# Patient Record
Sex: Female | Born: 1990 | Race: White | Hispanic: No | Marital: Married | Smoking: Never smoker
Health system: Southern US, Community
[De-identification: ages and names within clinical notes are randomized; demographics above are authoritative.]

## PROBLEM LIST (undated history)

## (undated) DIAGNOSIS — I10 Essential (primary) hypertension: Secondary | ICD-10-CM

## (undated) DIAGNOSIS — Z803 Family history of malignant neoplasm of breast: Secondary | ICD-10-CM

## (undated) DIAGNOSIS — K219 Gastro-esophageal reflux disease without esophagitis: Secondary | ICD-10-CM

## (undated) HISTORY — DX: Family history of malignant neoplasm of breast: Z80.3

## (undated) HISTORY — DX: Essential (primary) hypertension: I10

## (undated) HISTORY — PX: DIAGNOSTIC LAPAROSCOPY: SUR761

## (undated) HISTORY — PX: TONSILLECTOMY: SUR1361

---

## 2018-01-13 ENCOUNTER — Encounter: Payer: Self-pay | Admitting: Obstetrics and Gynecology

## 2018-01-13 ENCOUNTER — Ambulatory Visit (INDEPENDENT_AMBULATORY_CARE_PROVIDER_SITE_OTHER): Payer: BC Managed Care – PPO | Admitting: Obstetrics and Gynecology

## 2018-01-13 VITALS — BP 128/82 | HR 79 | Ht 69.0 in | Wt 258.0 lb

## 2018-01-13 DIAGNOSIS — Z31438 Encounter for other genetic testing of female for procreative management: Secondary | ICD-10-CM

## 2018-01-13 DIAGNOSIS — I10 Essential (primary) hypertension: Secondary | ICD-10-CM | POA: Diagnosis not present

## 2018-01-13 DIAGNOSIS — Z3169 Encounter for other general counseling and advice on procreation: Secondary | ICD-10-CM

## 2018-01-13 NOTE — Progress Notes (Signed)
Obstetrics & Gynecology Office Visit   Chief Complaint: Preconception Consult  History of Present Illness: Patient is a 27 y.o. G0P0000 interested in pursuing pregnancy in the near future.  She reports regular menstrual periods, and is currently using OCP (estrogen/progesterone) for contraception.  Prior pregnancies history of not applicable.  Her past medical history is notable for chronic hypertension.  The patient is current on her vaccinations, had MMR recently for work when titers did not document immunity.  She has had chickenpox.  Family history for the patient and her partner's family were reviewed.  There is no family history of genetic diseases , specifically Dyann Kief disease, spinal muscular atrophy, muscular dystrophy, skeletal dysplasias, cystic fibrosis and sickle cell disease.  There is no family history of birth defects , specifically spina bifida, cardiac defects, omphalocele or gastroschisis and cleft lip or cleft palate.  There is no [place], [place], middle [place] and [place] ancestry.  There is no family history of mental retardation, fragile X and or premature ovarian failure.  Patient has family history of autism  Review of Systems: Review of Systems  Constitutional: Negative for chills and fever.  HENT: Negative for congestion.   Respiratory: Negative for cough and shortness of breath.   Cardiovascular: Negative for chest pain and palpitations.  Gastrointestinal: Negative for abdominal pain, constipation, diarrhea, heartburn, nausea and vomiting.  Genitourinary: Negative for dysuria, frequency and urgency.  Skin: Negative for itching and rash.  Neurological: Negative for dizziness and headaches.  Endo/Heme/Allergies: Negative for polydipsia.  Psychiatric/Behavioral: Negative for depression.    Past Medical History:  Past Medical History:  Diagnosis Date  . Hypertension     Past Surgical History:  History reviewed. No pertinent surgical  history.  Gynecologic History: Patient's last menstrual period was 12/27/2017.  Obstetric History: G0P0000  Family History:  History reviewed. No pertinent family history.  Social History:  Social History   Socioeconomic History  . Marital status: Married    Spouse name: Not on file  . Number of children: Not on file  . Years of education: Not on file  . Highest education level: Not on file  Occupational History  . Not on file  Social Needs  . Financial resource strain: Not on file  . Food insecurity:    Worry: Not on file    Inability: Not on file  . Transportation needs:    Medical: Not on file    Non-medical: Not on file  Tobacco Use  . Smoking status: Never Smoker  . Smokeless tobacco: Never Used  Substance and Sexual Activity  . Alcohol use: Never    Frequency: Never  . Drug use: Never  . Sexual activity: Yes    Partners: Male    Birth control/protection: Pill  Lifestyle  . Physical activity:    Days per week: Not on file    Minutes per session: Not on file  . Stress: Not on file  Relationships  . Social connections:    Talks on phone: Not on file    Gets together: Not on file    Attends religious service: Not on file    Active member of club or organization: Not on file    Attends meetings of clubs or organizations: Not on file    Relationship status: Not on file  . Intimate partner violence:    Fear of current or ex partner: Not on file    Emotionally abused: Not on file    Physically abused: Not on  file    Forced sexual activity: Not on file  Other Topics Concern  . Not on file  Social History Narrative  . Not on file    Allergies:  Allergies  Allergen Reactions  . Penicillins Itching  . Sulfa Antibiotics     Medications: Prior to Admission medications   Medication Sig Start Date End Date Taking? Authorizing Provider  hydrochlorothiazide (HYDRODIURIL) 25 MG tablet Take by mouth.   Yes [provider]  Norgestimate-Ethinyl  Estradiol Triphasic (ORTHO TRI-CYCLEN LO) 0.18/0.215/0.25 MG-25 MCG tab Take by mouth. 10/23/17  Yes [provider]    Physical Exam Vitals:  Blood pressure 128/82, pulse 79, height 5' 9"  (1.753 m), weight 258 lb (117 kg), last menstrual period 12/27/2017. Body mass index is 38.1 kg/m.  General: NAD HEENT: normocephalic, anicteric Pulmonary: No increased work of breathing Neurologic: Grossly intact Psychiatric: mood appropriate, affect full  Female chaperone present for pelvic and breast  portions of the physical exam  Assessment: 27 y.o. G0P0000 presenting for preconception counseling  Plan: Problem List Items Addressed This Visit    None    Visit Diagnoses    Encounter for other genetic testing of female for procreative management    -  Primary   Relevant Orders   Inheritest Core(CF97,SMA,FraX)   Encounter for preconception consultation       Relevant Orders   Inheritest Core(CF97,SMA,FraX)   Benign essential HTN       Relevant Medications   hydrochlorothiazide (HYDRODIURIL) 25 MG tablet      1) The patient was instructed to start prenatal vitamins at least one month prior to actively trying to conceive.  The role and rational of prenatal vitamins in preventing neural tube defects were discussed. - taking prenatal gummies with DHA  2) Immunizations are up to date  3) Family history reviewed.  Preconception genetic testing and or counseling was offered at today's visit based on review of personal and family history.  4) HTN - well controlled on hydrochlorothiazide currently. There are no associated birth defects with hydrochlorothiazide use but theoretical risk of fetal biochemical abnormalities and decreased uteroplacental transfusion.  These risk appear to be mitigated in patient who are on HCTZ prior to conception.  However, the preferred blood pressure agents in pregnancy are labetalol or methyldopa.  - At present patient opts to discontinue HCTZ and monitor  BP's  - Discussed goal BP in pregnancy systolic <010 diastolic <932 - Discussed baseline labs on conception -  Start low dose ASA at >[redacted] weeks gestation as per USPTF recommendation "Low-Dose Aspirin Use for the Prevention of Morbidity and Mortality From Preeclampsia: Preventive Medicine"  furthermore endorsed by ACOG, WHO, and NIH based on evidence level B for the prevention of preeclampsia  In women deemed high risk  (diabetes, renal disease, chronic hypertension, history of preeclampsia in prior gestation, autoimmune diseases, or multifetal gestations)  5) A total of 30 minutes were spent in face-to-face contact with the patient during this encounter with over half of that time devoted to counseling and coordination of care.  5) Return in about 6 months (around 07/16/2018) for annual.   [name], MD, [place], [facility] 01/13/2018, 10:15 AM

## 2018-01-13 NOTE — Patient Instructions (Signed)
[email or web address] 

## 2018-01-25 LAB — INHERITEST CORE(CF97,SMA,FRAX)

## 2018-01-26 ENCOUNTER — Encounter (INDEPENDENT_AMBULATORY_CARE_PROVIDER_SITE_OTHER): Payer: Self-pay

## 2018-03-10 ENCOUNTER — Other Ambulatory Visit (HOSPITAL_COMMUNITY)
Admission: RE | Admit: 2018-03-10 | Discharge: 2018-03-10 | Disposition: A | Discharge status: Home / Self Care | Payer: BC Managed Care – PPO | Source: Ambulatory Visit | Attending: Obstetrics and Gynecology | Admitting: Obstetrics and Gynecology

## 2018-03-10 ENCOUNTER — Encounter: Payer: Self-pay | Admitting: Obstetrics and Gynecology

## 2018-03-10 ENCOUNTER — Ambulatory Visit (INDEPENDENT_AMBULATORY_CARE_PROVIDER_SITE_OTHER): Payer: BC Managed Care – PPO | Admitting: Obstetrics and Gynecology

## 2018-03-10 VITALS — BP 124/86 | HR 79 | Wt 263.0 lb

## 2018-03-10 DIAGNOSIS — Z124 Encounter for screening for malignant neoplasm of cervix: Secondary | ICD-10-CM | POA: Diagnosis not present

## 2018-03-10 DIAGNOSIS — O0991 Supervision of high risk pregnancy, unspecified, first trimester: Secondary | ICD-10-CM | POA: Insufficient documentation

## 2018-03-10 DIAGNOSIS — O99210 Obesity complicating pregnancy, unspecified trimester: Secondary | ICD-10-CM | POA: Insufficient documentation

## 2018-03-10 DIAGNOSIS — N912 Amenorrhea, unspecified: Secondary | ICD-10-CM

## 2018-03-10 DIAGNOSIS — O10911 Unspecified pre-existing hypertension complicating pregnancy, first trimester: Secondary | ICD-10-CM

## 2018-03-10 DIAGNOSIS — Z3201 Encounter for pregnancy test, result positive: Secondary | ICD-10-CM

## 2018-03-10 DIAGNOSIS — O10919 Unspecified pre-existing hypertension complicating pregnancy, unspecified trimester: Secondary | ICD-10-CM | POA: Insufficient documentation

## 2018-03-10 DIAGNOSIS — O99211 Obesity complicating pregnancy, first trimester: Secondary | ICD-10-CM

## 2018-03-10 DIAGNOSIS — Z3A01 Less than 8 weeks gestation of pregnancy: Secondary | ICD-10-CM | POA: Diagnosis not present

## 2018-03-10 DIAGNOSIS — O0990 Supervision of high risk pregnancy, unspecified, unspecified trimester: Secondary | ICD-10-CM | POA: Insufficient documentation

## 2018-03-10 LAB — POCT URINALYSIS DIPSTICK OB
Glucose, UA: NEGATIVE
POC,PROTEIN,UA: NEGATIVE

## 2018-03-10 LAB — POCT URINE PREGNANCY: Preg Test, Ur: POSITIVE — AB

## 2018-03-10 LAB — OB RESULTS CONSOLE VARICELLA ZOSTER ANTIBODY, IGG: Varicella: IMMUNE

## 2018-03-10 NOTE — Progress Notes (Signed)
New Obstetric Patient H&P    Chief Complaint: "Desires prenatal care"   History of Present Illness: Patient is a 27 y.o. G1P0000 Not Hispanic or Latino female, presents with amenorrhea and positive home pregnancy test. Patient's last menstrual period was 01/27/2018 (exact date). and based on her  LMP, her EDD is Estimated Date of Delivery: 11/03/18 and her EGA is [redacted]w[redacted]d. Cycles are regular monthly.    She had a urine pregnancy test which was positive 1 week(s)  ago. Her last menstrual period was normal. Since her LMP she claims she has experienced fatigue, breast tenderness, mild cramping. She denies vaginal bleeding. Her past medical history is notable for [facility], previously on HCTZ but discontinued on attempting to conceive.   Since her LMP, she admits to the use of tobacco products  no She claims she has gained   no pounds since the start of her pregnancy.  There are cats in the home in the home  no  She admits close contact with children on a regular basis  no  She has had chicken pox in the past yes She has had Tuberculosis exposures, symptoms, or previously tested positive for TB   no Current or past history of domestic violence. no  Genetic Screening/Teratology Counseling: (Includes patient, baby's father, or anyone in either family with:)   60. Patient's age >/= 34 at [facility]  no 2. Thalassemia ([place], [place], [place], or Asian background): MCV<80  no 3. Neural tube defect (meningomyelocele, spina bifida, anencephaly)  no 4. Congenital heart defect  not applicable  5. Down syndrome  no 6. Tay-Sachs (Jewish, [place])  no 7. Canavan's Disease  no 8. Sickle cell disease or trait (African)  no  9. Hemophilia or other blood disorders  no  10. Muscular dystrophy  no  11. Cystic fibrosis  no  12. Huntington's Chorea  no  13. Mental retardation/autism  no 14. Other inherited genetic or chromosomal disorder  no 15. Maternal metabolic disorder (DM, PKU, etc)  no 16. Patient  or FOB with a child with a birth defect not listed above no  16a. Patient or FOB with a birth defect themselves no 17. Recurrent pregnancy loss, or stillbirth  no  18. Any medications since LMP other than prenatal vitamins (include vitamins, supplements, OTC meds, drugs, alcohol)  no 19. Any other genetic/environmental exposure to discuss  no  Infection History:   1. Lives with someone with TB or TB exposed  no  2. Patient or partner has history of genital herpes  no 3. Rash or viral illness since LMP  no 4. History of STI (GC, CT, HPV, syphilis, HIV)  no 5. History of recent travel :  no  Other pertinent information:  no     Review of Systems:10 point review of systems negative unless otherwise noted in HPI  Past Medical History:  Past Medical History:  Diagnosis Date  . Hypertension     Past Surgical History:  History reviewed. No pertinent surgical history.  Gynecologic History: Patient's last menstrual period was 01/27/2018 (exact date).  Obstetric History: G1P0000  Family History:  No family history on file.  Social History:  Social History   Socioeconomic History  . Marital status: Married    Spouse name: Not on file  . Number of children: Not on file  . Years of education: Not on file  . Highest education level: Not on file  Occupational History  . Not on file  Social Needs  . Emergency planning/management officer  strain: Not on file  . Food insecurity:    Worry: Not on file    Inability: Not on file  . Transportation needs:    Medical: Not on file    Non-medical: Not on file  Tobacco Use  . Smoking status: Never Smoker  . Smokeless tobacco: Never Used  Substance and Sexual Activity  . Alcohol use: Never    Frequency: Never  . Drug use: Never  . Sexual activity: Yes    Partners: Male    Birth control/protection: Pill  Lifestyle  . Physical activity:    Days per week: Not on file    Minutes per session: Not on file  . Stress: Not on file  Relationships  .  Social connections:    Talks on phone: Not on file    Gets together: Not on file    Attends religious service: Not on file    Active member of club or organization: Not on file    Attends meetings of clubs or organizations: Not on file    Relationship status: Not on file  . Intimate partner violence:    Fear of current or ex partner: Not on file    Emotionally abused: Not on file    Physically abused: Not on file    Forced sexual activity: Not on file  Other Topics Concern  . Not on file  Social History Narrative  . Not on file    Allergies:  Allergies  Allergen Reactions  . Penicillins Itching  . Sulfa Antibiotics     Medications: Prior to Admission medications   Medication Sig Start Date End Date Taking? Authorizing Provider  hydrochlorothiazide (HYDRODIURIL) 25 MG tablet Take by mouth.    [provider]    Physical Exam Vitals: Blood pressure 124/86, pulse 79, weight 263 lb (119.3 kg), last menstrual period 01/27/2018. Body mass index is 38.84 kg/m.  General: NAD HEENT: normocephalic, anicteric Pulmonary: No increased work of breathing, CTAB Abdomen: Ssoft, non-tender, non-distended.  Umbilicus without lesions.  No hepatomegaly, splenomegaly or masses palpable. No evidence of hernia  Genitourinary:  External: Normal external female genitalia.  Normal urethral meatus, normal  Bartholin's and Skene's glands.    Vagina: Normal vaginal mucosa, no evidence of prolapse.    Cervix: Grossly normal in appearance, no bleeding  Uterus:  Non-enlarged, mobile, normal contour.  No CMT  Adnexa: ovaries non-enlarged, no adnexal masses  Rectal: deferred Extremities: no edema, erythema, or tenderness Neurologic: Grossly intact Psychiatric: mood appropriate, affect full   Assessment: 27 y.o. G1P0000 at [redacted]w[redacted]d presenting to initiate prenatal care  Pregnancy #1 Problems (from 01/27/18 to present)    Problem Noted Resolved   Supervision of high risk pregnancy, antepartum  03/10/2018 by [name], MD No   Overview Addendum 03/11/2018  9:55 AM by [name], MD    Clinic [facility] Prenatal Labs  Dating  Blood type: O/Positive/-- (09/18 1153)   Genetic Screen 1 Screen:    AFP:     NIPS: Inheritest: SMA, CF, Fragile-X negative Antibody:Negative (09/18 1153)  Anatomic [place]  Rubella: 5.81 (09/18 1153) Varicella: Immune  GTT Early:               Third trimester:  RPR: Non Reactive (09/18 1153)   Rhogam  HBsAg: Negative (09/18 1153)   TDaP vaccine                       Flu Shot: HIV: Non Reactive (09/18 1153)   Baby  Food                                GBS:   Contraception  Pap:  CBB     CS/VBAC    Support Person Husband [name]           Chronic hypertension during pregnancy, antepartum 03/10/2018 by [name], MD No   Overview Signed 03/11/2018  9:53 AM by [name], MD    [ ]  Aspirin 81 mg daily after 12 weeks; discontinue after 36 weeks [ ]  baseline labs with CBC, CMP, urine protein/creatinine ratio [ ]  no BP meds unless BPs become elevated [ ]  ultrasound for growth at 28, 32, 36 weeks [ ]  Baseline EKG   Current antihypertensives:  None   Baseline and surveillance labs (pulled in from [facility], refresh links as needed)  Lab Results  Component Value Date   PLT 379 03/10/2018   CREATININE 0.70 03/10/2018   AST 18 03/10/2018   ALT 10 03/10/2018    Antenatal Testing CHTN - O10.919  Group I  BP < 140/90, no preeclampsia, AGA,  nml AFV, +/- meds    Group II BP > 140/90, on meds, no preeclampsia, AGA, nml AFV  20-28-34-38  20-24-28-32-35-38  32//2 x wk  28//BPP wkly then 32//2 x wk  40 no meds; 39 meds  PRN or 37  Pre-eclampsia  GHTN - O13.9/Preeclampsia without severe features  - O14.00   Preeclampsia with severe features - O14.10  Q 3-4wks  Q 2 wks  28//BPP wkly then 32//2 x wk  Inpatient  37  PRN or 34        Maternal obesity, antepartum 03/10/2018 by [name], MD No       Plan: 1) Avoid  alcoholic beverages. 2) Patient encouraged not to smoke.  3) Discontinue the use of all non-medicinal drugs and chemicals.  4) Take prenatal vitamins daily.  5) Nutrition, food safety (fish, cheese advisories, and high nitrite foods) and exercise discussed. 6) Hospital and practice style discussed with cross coverage system.  7) Genetic Screening, such as with 1st Trimester Screening, cell free fetal DNA, AFP testing, and Ultrasound, as well as with amniocentesis and CVS as appropriate, is discussed with patient. At the conclusion of today's visit patient undecided genetic testing 8) CHTN - discussed ASA after 12 weeks, no meds unless BP 160/110, monthly growth scan starting at 28 weeks.  Baseline labs obtained today 9) Dating scan ordered  [name], MD, [place], [place]

## 2018-03-10 NOTE — Progress Notes (Signed)
NOB cramping

## 2018-03-11 LAB — RPR+RH+ABO+RUB AB+AB SCR+CB...
Antibody Screen: NEGATIVE
HEP B S AG: NEGATIVE
HIV Screen 4th Generation wRfx: NONREACTIVE
Hematocrit: 38.6 % (ref 34.0–46.6)
Hemoglobin: 13.2 g/dL (ref 11.1–15.9)
MCH: 28.4 pg (ref 26.6–33.0)
MCHC: 34.2 g/dL (ref 31.5–35.7)
MCV: 83 fL (ref 79–97)
Platelets: 379 10*3/uL (ref 150–450)
RBC: 4.65 x10E6/uL (ref 3.77–5.28)
RDW: 12.5 % (ref 12.3–15.4)
RH TYPE: POSITIVE
RPR Ser Ql: NONREACTIVE
Rubella Antibodies, IGG: 5.81 index (ref >0.99)
VARICELLA: 1480 {index} (ref >165)
WBC: 11.8 10*3/uL — ABNORMAL HIGH (ref 3.4–10.8)

## 2018-03-11 LAB — COMPREHENSIVE METABOLIC PANEL
ALBUMIN: 4.2 g/dL (ref 3.5–5.5)
ALK PHOS: 104 IU/L (ref 39–117)
ALT: 10 IU/L (ref 0–32)
AST: 18 IU/L (ref 0–40)
Albumin/Globulin Ratio: 1.8 (ref 1.2–2.2)
BUN / CREAT RATIO: 10 (ref 9–23)
BUN: 7 mg/dL (ref 6–20)
Bilirubin Total: 0.2 mg/dL (ref 0.0–1.2)
CO2: 20 mmol/L (ref 20–29)
CREATININE: 0.7 mg/dL (ref 0.57–1.00)
Calcium: 9.1 mg/dL (ref 8.7–10.2)
Chloride: 99 mmol/L (ref 96–106)
GFR calc Af Amer: 136 mL/min/{1.73_m2} (ref >59)
GFR calc non Af Amer: 118 mL/min/{1.73_m2} (ref >59)
GLUCOSE: 63 mg/dL — AB (ref 65–99)
Globulin, Total: 2.4 g/dL (ref 1.5–4.5)
Potassium: 4.4 mmol/L (ref 3.5–5.2)
Sodium: 137 mmol/L (ref 134–144)
Total Protein: 6.6 g/dL (ref 6.0–8.5)

## 2018-03-11 LAB — PROTEIN / CREATININE RATIO, URINE
CREATININE, UR: 33.7 mg/dL
PROTEIN/CREAT RATIO: 166 mg/g{creat} (ref 0–200)
Protein, Ur: 5.6 mg/dL

## 2018-03-12 LAB — URINE CULTURE: Organism ID, Bacteria: NO GROWTH

## 2018-03-12 LAB — CYTOLOGY - PAP
CHLAMYDIA, DNA PROBE: NEGATIVE
DIAGNOSIS: NEGATIVE
NEISSERIA GONORRHEA: NEGATIVE

## 2018-03-19 ENCOUNTER — Ambulatory Visit (INDEPENDENT_AMBULATORY_CARE_PROVIDER_SITE_OTHER): Payer: BC Managed Care – PPO

## 2018-03-19 ENCOUNTER — Other Ambulatory Visit: Payer: Self-pay | Admitting: Obstetrics and Gynecology

## 2018-03-19 ENCOUNTER — Other Ambulatory Visit: Payer: BC Managed Care – PPO

## 2018-03-19 ENCOUNTER — Ambulatory Visit (INDEPENDENT_AMBULATORY_CARE_PROVIDER_SITE_OTHER): Payer: BC Managed Care – PPO | Admitting: Obstetrics and Gynecology

## 2018-03-19 ENCOUNTER — Encounter: Payer: BC Managed Care – PPO | Admitting: Obstetrics and Gynecology

## 2018-03-19 VITALS — BP 112/64 | Wt 260.0 lb

## 2018-03-19 DIAGNOSIS — Z3A01 Less than 8 weeks gestation of pregnancy: Secondary | ICD-10-CM

## 2018-03-19 DIAGNOSIS — O3481 Maternal care for other abnormalities of pelvic organs, first trimester: Secondary | ICD-10-CM

## 2018-03-19 DIAGNOSIS — N83292 Other ovarian cyst, left side: Secondary | ICD-10-CM | POA: Diagnosis not present

## 2018-03-19 DIAGNOSIS — N83291 Other ovarian cyst, right side: Secondary | ICD-10-CM | POA: Diagnosis not present

## 2018-03-19 DIAGNOSIS — O0990 Supervision of high risk pregnancy, unspecified, unspecified trimester: Secondary | ICD-10-CM

## 2018-03-19 DIAGNOSIS — O99210 Obesity complicating pregnancy, unspecified trimester: Secondary | ICD-10-CM

## 2018-03-19 DIAGNOSIS — O10919 Unspecified pre-existing hypertension complicating pregnancy, unspecified trimester: Secondary | ICD-10-CM

## 2018-03-19 LAB — POCT URINALYSIS DIPSTICK OB
GLUCOSE, UA: NEGATIVE
POC,PROTEIN,UA: NEGATIVE

## 2018-03-19 NOTE — Progress Notes (Signed)
ROB  Nausea Dating scan  Flu vaccine

## 2018-03-21 NOTE — Progress Notes (Signed)
Routine Prenatal Care Visit  Subjective  Claudia Byrd is a 27 y.o. G1P0000 at [redacted]w[redacted]d being seen today for ongoing prenatal care.  She is currently monitored for the following issues for this high-risk pregnancy and has Supervision of high risk pregnancy, antepartum; Chronic hypertension during pregnancy, antepartum; and Maternal obesity, antepartum on their problem list.  ----------------------------------------------------------------------------------- Patient reports no complaints.    .  .   . Denies leaking of fluid.  ----------------------------------------------------------------------------------- The following portions of the patient's history were reviewed and updated as appropriate: allergies, current medications, past family history, past medical history, past social history, past surgical history and problem list. Problem list updated.   Objective  Blood pressure 112/64, weight 260 lb (117.9 kg), last menstrual period 01/27/2018. Pregravid weight 260 lb (117.9 kg) Total Weight Gain 0 lb (0 kg) Urinalysis:      Fetal Status: Fetal Heart Rate (bpm): present         General:  Alert, oriented and cooperative. Patient is in no acute distress.  Skin: Skin is warm and dry. No rash noted.   Cardiovascular: Normal heart rate noted  Respiratory: Normal respiratory effort, no problems with respiration noted  Abdomen: Soft, gravid, appropriate for gestational age.       Pelvic:  Cervical exam deferred        Extremities: Normal range of motion.     ental Status: Normal mood and affect. Normal behavior. Normal judgment and thought content.   US Ob Transvaginal  Result Date: 03/19/2018 Patient Name: Claudia Byrd DOB: 1990-09-03 MRN: [id number] ULTRASOUND REPORT Location: [facility] Date of Service: 03/19/2018 Indications:dating Findings: [name] intrauterine pregnancy is visualized with a CRL consistent with [redacted]w[redacted]d gestation, giving an (U/S) EDD of 10/30/2018. The (U/S) EDD is  consistent with the clinically established EDD of 11/03/2018. FHR: 166 CRL measurement: 15.0 mm Yolk sac is visualized and appears normal and early anatomy is normal. Amnion: visualized and appears normal Right Ovary is not normal in appearance. Large simple cyst measuring 6.1 x 6.2 x 5.2 cm. Left Ovary is not normal appearance. Two simple cyst seen measuring 4.0 x 2.2 x 2.3 cm. Survey of the adnexa demonstrates no adnexal masses. There is trace amount of free peritoneal fluid in the posterior cul de sac. Impression: 1. [redacted]w[redacted]d Viable Singleton Intrauterine pregnancy by U/S. 2. (U/S) EDD is consistent with Clinically established EDD of 11/03/2018 3. Bilateral complex ovarian cysts. Recommendations: 1.Clinical correlation with the patient's History and Physical Exam. Vita Barley, RDMS RVT There is a viable singleton gestation.  The fetal biometry correlates with established dating. Detailed evaluation of the fetal anatomy is precluded by early gestational age.  Corpus luteum cyst left, large simple cyst right.  Re-image at time of anatomy scan or early if symptomatic.  It must be noted that a normal ultrasound particular at this early gestational age is unable to rule out fetal aneuploidy, risk of first trimester miscarriage, or anatomic birth defects. [name], MD, [facility], [facility]     Assessment   27 y.o. G1P0000 at [redacted]w[redacted]d by  11/03/2018, by Last Menstrual Period presenting for routine prenatal visit  Plan   Pregnancy #1 Problems (from 01/27/18 to present)    Problem Noted Resolved   Supervision of high risk pregnancy, antepartum 03/10/2018 by [name], MD No   Overview Addendum 03/11/2018  9:55 AM by [name], MD    Clinic [facility] Prenatal Labs  Dating  Blood type: O/Positive/-- (09/18 1153)  Genetic Screen 1 Screen:    AFP:     NIPS: Inheritest: SMA, CF, Fragile-X negative Antibody:Negative (09/18 1153)  Anatomic [place]  Rubella: 5.81 (09/18  1153) Varicella: Immune  GTT Early:               Third trimester:  RPR: Non Reactive (09/18 1153)   Rhogam  HBsAg: Negative (09/18 1153)   TDaP vaccine                       Flu Shot: HIV: Non Reactive (09/18 1153)   Baby Food                                GBS:   Contraception  Pap:  CBB     CS/VBAC    Support Person Husband Seth           Chronic hypertension during pregnancy, antepartum 03/10/2018 by [name], MD No   Overview Signed 03/11/2018  9:53 AM by [name], MD    [ ]  Aspirin 81 mg daily after 12 weeks; discontinue after 36 weeks [ ]  baseline labs with CBC, CMP, urine protein/creatinine ratio [ ]  no BP meds unless BPs become elevated [ ]  ultrasound for growth at 28, 32, 36 weeks [ ]  Baseline EKG   Current antihypertensives:  None   Baseline and surveillance labs (pulled in from [facility], refresh links as needed)  Lab Results  Component Value Date   PLT 379 03/10/2018   CREATININE 0.70 03/10/2018   AST 18 03/10/2018   ALT 10 03/10/2018    Antenatal Testing CHTN - O10.919  Group I  BP < 140/90, no preeclampsia, AGA,  nml AFV, +/- meds    Group II BP > 140/90, on meds, no preeclampsia, AGA, nml AFV  20-28-34-38  20-24-28-32-35-38  32//2 x wk  28//BPP wkly then 32//2 x wk  40 no meds; 39 meds  PRN or 37  Pre-eclampsia  GHTN - O13.9/Preeclampsia without severe features  - O14.00   Preeclampsia with severe features - O14.10  Q 3-4wks  Q 2 wks  28//BPP wkly then 32//2 x wk  Inpatient  37  PRN or 34        Maternal obesity, antepartum 03/10/2018 by [name], MD No       Gestational age appropriate obstetric precautions including but not limited to vaginal bleeding, contractions, leaking of fluid and fetal movement were reviewed in detail with the patient.    - S=D, suspect corpus luteum and theca lutein cysts   Return in about 4 weeks (around 04/16/2018) for [facility] and early 1-hr/OB billing with with Janett Billow.  [name], MD, [facility], [place]

## 2018-04-16 ENCOUNTER — Ambulatory Visit (INDEPENDENT_AMBULATORY_CARE_PROVIDER_SITE_OTHER): Payer: BC Managed Care – PPO | Admitting: Obstetrics and Gynecology

## 2018-04-16 ENCOUNTER — Other Ambulatory Visit: Payer: BC Managed Care – PPO

## 2018-04-16 VITALS — BP 132/80 | Wt 268.0 lb

## 2018-04-16 DIAGNOSIS — O10919 Unspecified pre-existing hypertension complicating pregnancy, unspecified trimester: Secondary | ICD-10-CM

## 2018-04-16 DIAGNOSIS — O0990 Supervision of high risk pregnancy, unspecified, unspecified trimester: Secondary | ICD-10-CM

## 2018-04-16 DIAGNOSIS — Z1379 Encounter for other screening for genetic and chromosomal anomalies: Secondary | ICD-10-CM

## 2018-04-16 DIAGNOSIS — O99211 Obesity complicating pregnancy, first trimester: Secondary | ICD-10-CM

## 2018-04-16 DIAGNOSIS — Z3A11 11 weeks gestation of pregnancy: Secondary | ICD-10-CM

## 2018-04-16 DIAGNOSIS — O99210 Obesity complicating pregnancy, unspecified trimester: Secondary | ICD-10-CM

## 2018-04-16 DIAGNOSIS — O10911 Unspecified pre-existing hypertension complicating pregnancy, first trimester: Secondary | ICD-10-CM

## 2018-04-16 LAB — POCT URINALYSIS DIPSTICK OB
Glucose, UA: NEGATIVE
PROTEIN: NEGATIVE

## 2018-04-16 NOTE — Progress Notes (Signed)
ROB Early GTT 

## 2018-04-16 NOTE — Progress Notes (Signed)
Routine Prenatal Care Visit  Subjective  Claudia Byrd is a 27 y.o. G1P0000 at 107w2d being seen today for ongoing prenatal care.  She is currently monitored for the following issues for this high-risk pregnancy and has Supervision of high risk pregnancy, antepartum; Chronic hypertension during pregnancy, antepartum; and Maternal obesity, antepartum on their problem list.  ----------------------------------------------------------------------------------- Patient reports no complaints.   Contractions: Not present. Vag. Bleeding: None.  Movement: Absent. Denies leaking of fluid.  ----------------------------------------------------------------------------------- The following portions of the patient's history were reviewed and updated as appropriate: allergies, current medications, past family history, past medical history, past social history, past surgical history and problem list. Problem list updated.   Objective  Blood pressure 132/80, weight 268 lb (121.6 kg), last menstrual period 01/27/2018. Pregravid weight 260 lb (117.9 kg) Total Weight Gain 8 lb (3.629 kg) Urinalysis:      Fetal Status: Fetal Heart Rate (bpm): 157   Movement: Absent     General:  Alert, oriented and cooperative. Patient is in no acute distress.  Skin: Skin is warm and dry. No rash noted.   Cardiovascular: Normal heart rate noted  Respiratory: Normal respiratory effort, no problems with respiration noted  Abdomen: Soft, gravid, appropriate for gestational age. Pain/Pressure: Absent     Pelvic:  Cervical exam deferred        Extremities: Normal range of motion.     ental Status: Normal mood and affect. Normal behavior. Normal judgment and thought content.    There is no immunization history on file for this patient.   Assessment   27 y.o. G1P0000 at [redacted]w[redacted]d by  11/03/2018, by Last Menstrual Period presenting for routine prenatal visit  Plan   Pregnancy #1 Problems (from 01/27/18 to present)    Problem  Noted Resolved   Supervision of high risk pregnancy, antepartum 03/10/2018 by [name], MD No   Overview Addendum 03/11/2018  9:55 AM by [name], MD    [facility] Prenatal Labs  Dating LMP = 7 week [place] Blood type: O/Positive/-- (09/18 1153)   Genetic Screen 1 Screen:    AFP:     NIPS: Inheritest: SMA, CF, Fragile-X negative Antibody:Negative (09/18 1153)  Anatomic [place]  Rubella: 5.81 (09/18 1153) Varicella: Immune  GTT Early:  Obtained 10/25            Third trimester:  RPR: Non Reactive (09/18 1153)   Rhogam N/A HBsAg: Negative (09/18 1153)   TDaP vaccine                       Flu Shot: HIV: Non Reactive (09/18 1153)   Baby Food                                GBS:   Contraception  Pap: 03/10/18 NIL  CBB     CS/VBAC    Support Person Husband Seth           Chronic hypertension during pregnancy, antepartum 03/10/2018 by [name], MD No   Overview Signed 03/11/2018  9:53 AM by [name], MD    [X]  Aspirin 81 mg daily after 12 weeks; discontinue after 36 weeks [X]  baseline labs with CBC, CMP, urine protein/creatinine ratio [ ]  no BP meds unless BPs become elevated [ ]  ultrasound for growth at 28, 32, 36 weeks [ ]  Baseline EKG   Current antihypertensives:  None   Baseline and surveillance labs (pulled  in from [facility], refresh links as needed)  Lab Results  Component Value Date   PLT 379 03/10/2018   CREATININE 0.70 03/10/2018   AST 18 03/10/2018   ALT 10 03/10/2018    Antenatal Testing CHTN - O10.919  Group I  BP < 140/90, no preeclampsia, AGA,  nml AFV, +/- meds    Group II BP > 140/90, on meds, no preeclampsia, AGA, nml AFV  20-28-34-38  20-24-28-32-35-38  32//2 x wk  28//BPP wkly then 32//2 x wk  40 no meds; 39 meds  PRN or 37  Pre-eclampsia  GHTN - O13.9/Preeclampsia without severe features  - O14.00   Preeclampsia with severe features - O14.10  Q 3-4wks  Q 2 wks  28//BPP wkly then 32//2 x wk  Inpatient  37  PRN or 34         Maternal obesity, antepartum 03/10/2018 by [name], MD No       Gestational age appropriate obstetric precautions including but not limited to vaginal bleeding, contractions, leaking of fluid and fetal movement were reviewed in detail with the patient.   - maternity 21 and early 1-hr glucola today - recommend ASA after 12 weeks  Return in about 4 weeks (around 05/14/2018) for ROB.  [name], MD, [facility], [facility] 04/16/2018, 9:27 AM

## 2018-04-17 LAB — GLUCOSE TOLERANCE, 1 HOUR: GLUCOSE, 1HR PP: 111 mg/dL (ref 65–199)

## 2018-04-23 LAB — MATERNIT 21 PLUS CORE, BLOOD
CHROMOSOME 13: NEGATIVE
CHROMOSOME 18: NEGATIVE
CHROMOSOME 21: NEGATIVE
Y Chromosome: NOT DETECTED

## 2018-04-26 ENCOUNTER — Telehealth: Payer: Self-pay

## 2018-04-26 NOTE — Telephone Encounter (Signed)
Pt's appt was changed; wants to know genetic testing results.  [phone]  Pt aware of neg results; female.

## 2018-05-14 ENCOUNTER — Encounter: Payer: BC Managed Care – PPO | Admitting: Obstetrics and Gynecology

## 2018-05-14 ENCOUNTER — Ambulatory Visit (INDEPENDENT_AMBULATORY_CARE_PROVIDER_SITE_OTHER): Payer: BC Managed Care – PPO | Admitting: Advanced Practice Midwife

## 2018-05-14 ENCOUNTER — Encounter: Payer: Self-pay | Admitting: Advanced Practice Midwife

## 2018-05-14 VITALS — BP 120/80 | Wt 271.0 lb

## 2018-05-14 DIAGNOSIS — Z3A15 15 weeks gestation of pregnancy: Secondary | ICD-10-CM

## 2018-05-14 DIAGNOSIS — O0990 Supervision of high risk pregnancy, unspecified, unspecified trimester: Secondary | ICD-10-CM

## 2018-05-14 DIAGNOSIS — O10912 Unspecified pre-existing hypertension complicating pregnancy, second trimester: Secondary | ICD-10-CM

## 2018-05-14 LAB — POCT URINALYSIS DIPSTICK OB
Glucose, UA: NEGATIVE
PROTEIN: NEGATIVE

## 2018-05-14 NOTE — Progress Notes (Signed)
Routine Prenatal Care Visit  Subjective  Claudia Byrd is a 27 y.o. G1P0000 at [redacted]w[redacted]d being seen today for ongoing prenatal care.  She is currently monitored for the following issues for this high-risk pregnancy and has Supervision of high risk pregnancy, antepartum; Chronic hypertension during pregnancy, antepartum; and Maternal obesity, antepartum on their problem list.  ----------------------------------------------------------------------------------- Patient reports having a cold for the past few days. Reviewed comfort measures.    . Vag. Bleeding: None.  Movement: Absent. Denies leaking of fluid.  ----------------------------------------------------------------------------------- The following portions of the patient's history were reviewed and updated as appropriate: allergies, current medications, past family history, past medical history, past social history, past surgical history and problem list. Problem list updated.   Objective  Blood pressure 120/80, weight 271 lb (122.9 kg), last menstrual period 01/27/2018. Pregravid weight 260 lb (117.9 kg) Total Weight Gain 11 lb (4.99 kg) Urinalysis: Urine Protein Negative  Urine Glucose Negative  Fetal Status: Fetal Heart Rate (bpm): 145   Movement: Absent     General:  Alert, oriented and cooperative. Patient is in no acute distress.  Skin: Skin is warm and dry. No rash noted.   Cardiovascular: Normal heart rate noted  Respiratory: Normal respiratory effort, no problems with respiration noted  Abdomen: Soft, gravid, appropriate for gestational age.       Pelvic:  Cervical exam deferred        Extremities: Normal range of motion.     Mental Status: Normal mood and affect. Normal behavior. Normal judgment and thought content.   Assessment   27 y.o. G1P0000 at [redacted]w[redacted]d by  11/03/2018, by Last Menstrual Period presenting for routine prenatal visit  Plan   Pregnancy #1 Problems (from 01/27/18 to present)    Problem Noted Resolved   Supervision of high risk pregnancy, antepartum 03/10/2018 by [name], MD No   Overview Addendum 04/27/2018  2:10 PM by [name], MD    [facility] Prenatal Labs  Dating LMP = 7 week [place] Blood type: O/Positive/-- (09/18 1153)   Genetic Screen NIPS:Normal XX Inheritest: SMA, CF, Fragile-X negative Antibody:Negative (09/18 1153)  Anatomic [place]  Rubella: 5.81 (09/18 1153) Varicella: Immune  GTT Early:111   Third trimester:  RPR: Non Reactive (09/18 1153)   Rhogam N/A HBsAg: Negative (09/18 1153)   TDaP vaccine                       Flu Shot: HIV: Non Reactive (09/18 1153)   Baby Food                                GBS:   Contraception  Pap: 03/10/18 NIL  CBB     CS/VBAC    Support Person Husband Seth           Chronic hypertension during pregnancy, antepartum 03/10/2018 by [name], MD No   Overview Addendum 04/16/2018  9:30 AM by [name], MD    [X]  Aspirin 81 mg daily after 12 weeks; discontinue after 36 weeks [X]  baseline labs with CBC, CMP, urine protein/creatinine ratio [ ]  no BP meds unless BPs become elevated [ ]  ultrasound for growth at 28, 32, 36 weeks  Current antihypertensives:  None   Baseline and surveillance labs (pulled in from [facility], refresh links as needed)  Lab Results  Component Value Date   PLT 379 03/10/2018   CREATININE 0.70 03/10/2018   AST 18 03/10/2018  ALT 10 03/10/2018    Antenatal Testing CHTN - O10.919  Group I  BP < 140/90, no preeclampsia, AGA,  nml AFV, +/- meds    Group II BP > 140/90, on meds, no preeclampsia, AGA, nml AFV  20-28-34-38  20-24-28-32-35-38  32//2 x wk  28//BPP wkly then 32//2 x wk  40 no meds; 39 meds  PRN or 37  Pre-eclampsia  GHTN - O13.9/Preeclampsia without severe features  - O14.00   Preeclampsia with severe features - O14.10  Q 3-4wks  Q 2 wks  28//BPP wkly then 32//2 x wk  Inpatient  37  PRN or 34        Maternal obesity, antepartum 03/10/2018 by [name],  MD No       Preterm labor symptoms and general obstetric precautions including but not limited to vaginal bleeding, contractions, leaking of fluid and fetal movement were reviewed in detail with the patient. Please refer to After Visit Summary for other counseling recommendations.   Return in about 4 weeks (around 06/11/2018) for anatomy scan and rob.  Rod Can, CNM 05/14/2018 8:31 AM

## 2018-05-14 NOTE — Patient Instructions (Signed)

## 2018-05-14 NOTE — Progress Notes (Signed)
ROB- no concerns 

## 2018-06-17 ENCOUNTER — Ambulatory Visit (INDEPENDENT_AMBULATORY_CARE_PROVIDER_SITE_OTHER): Payer: BC Managed Care – PPO | Admitting: Obstetrics and Gynecology

## 2018-06-17 ENCOUNTER — Ambulatory Visit (INDEPENDENT_AMBULATORY_CARE_PROVIDER_SITE_OTHER): Payer: BC Managed Care – PPO

## 2018-06-17 ENCOUNTER — Encounter: Payer: Self-pay | Admitting: Obstetrics and Gynecology

## 2018-06-17 VITALS — BP 120/70 | Wt 277.0 lb

## 2018-06-17 DIAGNOSIS — Z363 Encounter for antenatal screening for malformations: Secondary | ICD-10-CM | POA: Diagnosis not present

## 2018-06-17 DIAGNOSIS — O99210 Obesity complicating pregnancy, unspecified trimester: Secondary | ICD-10-CM

## 2018-06-17 DIAGNOSIS — IMO0002 Reserved for concepts with insufficient information to code with codable children: Secondary | ICD-10-CM

## 2018-06-17 DIAGNOSIS — O99212 Obesity complicating pregnancy, second trimester: Secondary | ICD-10-CM

## 2018-06-17 DIAGNOSIS — O10912 Unspecified pre-existing hypertension complicating pregnancy, second trimester: Secondary | ICD-10-CM

## 2018-06-17 DIAGNOSIS — Z0489 Encounter for examination and observation for other specified reasons: Secondary | ICD-10-CM

## 2018-06-17 DIAGNOSIS — O0990 Supervision of high risk pregnancy, unspecified, unspecified trimester: Secondary | ICD-10-CM

## 2018-06-17 DIAGNOSIS — Z3A20 20 weeks gestation of pregnancy: Secondary | ICD-10-CM

## 2018-06-17 DIAGNOSIS — J019 Acute sinusitis, unspecified: Secondary | ICD-10-CM

## 2018-06-17 DIAGNOSIS — O10919 Unspecified pre-existing hypertension complicating pregnancy, unspecified trimester: Secondary | ICD-10-CM

## 2018-06-17 LAB — POCT URINALYSIS DIPSTICK OB
Glucose, UA: NEGATIVE
POC,PROTEIN,UA: NEGATIVE

## 2018-06-17 MED ORDER — AZITHROMYCIN 250 MG PO TABS: ORAL_TABLET | ORAL | 1 refills | Status: DC

## 2018-06-17 NOTE — Progress Notes (Addendum)
Routine Prenatal Care Visit  Subjective  Claudia Byrd is a 27 y.o. G1P0000 at [redacted]w[redacted]d being seen today for ongoing prenatal care.  She is currently monitored for the following issues for this high-risk pregnancy and has Supervision of high risk pregnancy, antepartum; Chronic hypertension during pregnancy, antepartum; and Maternal obesity, antepartum on their problem list.  ----------------------------------------------------------------------------------- Patient reports no complaints.   Contractions: Not present. Vag. Bleeding: None.  Movement: Absent. Denies leaking of fluid.  ----------------------------------------------------------------------------------- The following portions of the patient's history were reviewed and updated as appropriate: allergies, current medications, past family history, past medical history, past social history, past surgical history and problem list. Problem list updated.   Objective  Blood pressure 120/70, weight 277 lb (125.6 kg), last menstrual period 01/27/2018. Pregravid weight 260 lb (117.9 kg) Total Weight Gain 17 lb (7.711 kg) Urinalysis:      Fetal Status: Fetal Heart Rate (bpm): 145   Movement: Absent     General:  Alert, oriented and cooperative. Patient is in no acute distress.  Skin: Skin is warm and dry. No rash noted.   Cardiovascular: Normal heart rate noted  Respiratory: Normal respiratory effort, no problems with respiration noted  Abdomen: Soft, gravid, appropriate for gestational age. Pain/Pressure: Absent     Pelvic:  Cervical exam deferred        Extremities: Normal range of motion.     ental Status: Normal mood and affect. Normal behavior. Normal judgment and thought content.   Immunization History  Administered Date(s) Administered  . Tdap 10/29/2011, 06/13/2016   US Ob Comp + 14 Wk  Result Date: 06/17/2018 Patient Name: Claudia Byrd DOB: 1991/06/23 MRN: [id number] ULTRASOUND REPORT Location: [facility] Date of Service:  06/17/2018 Indications:Anatomy Ultrasound Findings: Claudia Byrd intrauterine pregnancy is visualized with FHR at 144 BPM. Biometrics give an (U/S) Gestational age of [redacted]w[redacted]d and an (U/S) EDD of 11/01/2018; this correlates with the clinically established Estimated Date of Delivery: 11/03/18 Fetal presentation is Cephalic. EFW: 352g (12oz). Placenta: anterior. Grade: 0 AFI: subjectively normal. Anatomic survey is incomplete for 4 chamber heart and outflow tracts; Gender - female.  Right Ovary has large cyst measuring 7cm. Left Ovary is normal appearance. Survey of the adnexa demonstrates no adnexal masses. There is no free peritoneal fluid in the cul de sac. Impression: 1. [redacted]w[redacted]d Viable Singleton Intrauterine pregnancy by U/S. 2. (U/S) EDD is consistent with Clinically established Estimated Date of Delivery: 11/03/18 . 3. Follow up cardiac views. 4. 7cm right ovarian cyst. Recommendations: 1.Clinical correlation with the patient's History and Physical Exam. Claudia Byrd, RDMS RVT  There is a singleton gestation with subjectively normal amniotic fluid volume. The fetal biometry correlates with established dating. Detailed evaluation of the fetal anatomy was performed.The fetal anatomical survey appears within normal limits within the resolution of ultrasound as described above, with incomplete cardiac views.  Previously imaged bilateral theca lutein cysts, resolution on left persistence on right.  It must be noted that a normal ultrasound is unable to rule out fetal aneuploidy.  [name], MD, [facility], [facility] 06/17/2018, 9:21 AM     Assessment   27 y.o. G1P0000 at [redacted]w[redacted]d by  11/03/2018, by Last Menstrual Period presenting for routine prenatal visit  Plan   Pregnancy #1 Problems (from 01/27/18 to present)    Problem Noted Resolved   Supervision of high risk pregnancy, antepartum 03/10/2018 by [name], MD No   Overview Addendum 04/27/2018  2:10 PM by [name],  MD  [facility] Prenatal Labs  Dating LMP = 7 week [place] Blood type: O/Positive/-- (09/18 1153)   Genetic Screen NIPS:Normal XX Inheritest: SMA, CF, Fragile-X negative Antibody:Negative (09/18 1153)  Anatomic [place]  Rubella: 5.81 (09/18 1153) Varicella: Immune  GTT Early:111   Third trimester:  RPR: Non Reactive (09/18 1153)   Rhogam N/A HBsAg: Negative (09/18 1153)   TDaP vaccine                       Flu Shot: HIV: Non Reactive (09/18 1153)   Baby Food                                GBS:   Contraception  Pap: 03/10/18 NIL  CBB     CS/VBAC    Support Person Husband Seth           Chronic hypertension during pregnancy, antepartum 03/10/2018 by [name], MD No   Overview Addendum 04/16/2018  9:30 AM by [name], MD    [X]  Aspirin 81 mg daily after 12 weeks; discontinue after 36 weeks [X]  baseline labs with CBC, CMP, urine protein/creatinine ratio [ ]  no BP meds unless BPs become elevated [ ]  ultrasound for growth at 28, 32, 36 weeks  Current antihypertensives:  None   Baseline and surveillance labs (pulled in from [facility], refresh links as needed)  Lab Results  Component Value Date   PLT 379 03/10/2018   CREATININE 0.70 03/10/2018   AST 18 03/10/2018   ALT 10 03/10/2018    Antenatal Testing CHTN - O10.919  Group I  BP < 140/90, no preeclampsia, AGA,  nml AFV, +/- meds    Group II BP > 140/90, on meds, no preeclampsia, AGA, nml AFV  20-28-34-38  20-24-28-32-35-38  32//2 x wk  28//BPP wkly then 32//2 x wk  40 no meds; 39 meds  PRN or 37  Pre-eclampsia  GHTN - O13.9/Preeclampsia without severe features  - O14.00   Preeclampsia with severe features - O14.10  Q 3-4wks  Q 2 wks  28//BPP wkly then 32//2 x wk  Inpatient  37  PRN or 34        Maternal obesity, antepartum 03/10/2018 by [name], MD No       Gestational age appropriate obstetric precautions including but not limited to vaginal bleeding, contractions, leaking of fluid and  fetal movement were reviewed in detail with the patient.   - follow up anatomy scan next visit - rx azithromycin sinusitis (PCN allergy)  Return in about 4 weeks (around 07/15/2018) for ROB and follow up anatomy scan.  [name], MD, [facility], [facility] 06/17/2018, 9:40 AM

## 2018-06-17 NOTE — Progress Notes (Signed)
Anatomy scan today. Increased d/c since last visit.

## 2018-06-17 NOTE — Addendum Note (Signed)
Addended by: Dorthula Nettles on: 06/17/2018 10:30 AM   Modules accepted: Orders

## 2018-06-23 NOTE — L&D Delivery Note (Addendum)
Delivery Note - [facility]D Primary OB: [facility] Delivery Physician: Barnett Applebaum, MD Gestational Age: Full term Antepartum complications: none Intrapartum complications: None  Kiwi flat vacuum was applied for maternal exhaustion after 2+ hours of pushing and favorable exam and station.  3 application coinciding w three consequetive contractions.  One pop off.  Delivery successful on the third contraction application.  LOA. No fetal scalp injuries noted.  No shoulder dystocia.  A viable Female was delivered via vertex perentation.  Apgars:8 ,9  Weight:  pending .   Placenta status: spontaneous and Intact.  Cord: 3+ vessels;  with the following complications: nuchal.  Anesthesia:  epidural Episiotomy:  none Lacerations:  3rd Suture Repair: 2.0 vicryl Est. Blood Loss (mL):  300 mL  Mom to postpartum.  Baby to Couplet care / Skin to Skin.  Barnett Applebaum, MD, [facility], [facility] 11/01/2018  10:59 PM [phone]

## 2018-06-30 ENCOUNTER — Other Ambulatory Visit: Payer: Self-pay | Admitting: Obstetrics and Gynecology

## 2018-06-30 MED ORDER — FLUCONAZOLE 150 MG PO TABS: 150.0000 mg | ORAL_TABLET | Freq: Once | ORAL | 0 refills | Status: AC

## 2018-07-13 ENCOUNTER — Ambulatory Visit (INDEPENDENT_AMBULATORY_CARE_PROVIDER_SITE_OTHER): Payer: BC Managed Care – PPO | Admitting: Obstetrics & Gynecology

## 2018-07-13 ENCOUNTER — Ambulatory Visit (INDEPENDENT_AMBULATORY_CARE_PROVIDER_SITE_OTHER): Payer: BC Managed Care – PPO

## 2018-07-13 VITALS — BP 110/70 | Wt 286.0 lb

## 2018-07-13 DIAGNOSIS — O99212 Obesity complicating pregnancy, second trimester: Secondary | ICD-10-CM

## 2018-07-13 DIAGNOSIS — O10912 Unspecified pre-existing hypertension complicating pregnancy, second trimester: Secondary | ICD-10-CM

## 2018-07-13 DIAGNOSIS — O0990 Supervision of high risk pregnancy, unspecified, unspecified trimester: Secondary | ICD-10-CM

## 2018-07-13 DIAGNOSIS — Z0489 Encounter for examination and observation for other specified reasons: Secondary | ICD-10-CM

## 2018-07-13 DIAGNOSIS — Z3A23 23 weeks gestation of pregnancy: Secondary | ICD-10-CM

## 2018-07-13 DIAGNOSIS — Z362 Encounter for other antenatal screening follow-up: Secondary | ICD-10-CM | POA: Diagnosis not present

## 2018-07-13 DIAGNOSIS — O10919 Unspecified pre-existing hypertension complicating pregnancy, unspecified trimester: Secondary | ICD-10-CM

## 2018-07-13 DIAGNOSIS — Z131 Encounter for screening for diabetes mellitus: Secondary | ICD-10-CM

## 2018-07-13 DIAGNOSIS — O99210 Obesity complicating pregnancy, unspecified trimester: Secondary | ICD-10-CM

## 2018-07-13 DIAGNOSIS — IMO0002 Reserved for concepts with insufficient information to code with codable children: Secondary | ICD-10-CM

## 2018-07-13 LAB — POCT URINALYSIS DIPSTICK OB
Glucose, UA: NEGATIVE
POC,PROTEIN,UA: NEGATIVE

## 2018-07-13 NOTE — Progress Notes (Signed)
  Subjective  Fetal Movement? yes Contractions? no Leaking Fluid? no Vaginal Bleeding? no  Objective  BP 110/70   Wt 286 lb (129.7 kg)   LMP 01/27/2018 (Exact Date)   BMI 42.23 kg/m  General: NAD Pumonary: no increased work of breathing Abdomen: gravid, non-tender Extremities: no edema Psychiatric: mood appropriate, affect full  Assessment  28 y.o. G1P0000 at [redacted]w[redacted]d by  11/03/2018, by Last Menstrual Period presenting for routine prenatal visit  Plan   Problem List Items Addressed This Visit      Cardiovascular and Mediastinum   Chronic hypertension during pregnancy, antepartum     Other   Supervision of high risk pregnancy, antepartum   Maternal obesity, antepartum    Other Visit Diagnoses    [redacted] weeks gestation of pregnancy    -  Primary   Relevant Orders   POC Urinalysis Dipstick OB (Completed)   Screening for diabetes mellitus       Relevant Orders   28 Week RH+Panel   Evaluate anatomy not seen on prior sonogram        Review of ULTRASOUND. I have personally reviewed images and report of recent ultrasound done at [facility]. There is a singleton gestation with subjectively normal amniotic fluid volume. The fetal biometry correlates with established dating. Detailed evaluation of the fetal anatomy was performed.The fetal anatomical survey appears within normal limits within the resolution of ultrasound as described above.  It must be noted that a normal ultrasound is unable to rule out fetal aneuploidy.    No HTN or sx's of HTN. Monitor as third trimester approaches    ASA daily  Barnett Applebaum, MD, [facility] 07/13/2018  4:36 PM

## 2018-07-13 NOTE — Patient Instructions (Signed)
Hypertension During Pregnancy ° °Hypertension, commonly called high blood pressure, is when the force of blood pumping through your arteries is too strong. Arteries are blood vessels that carry blood from the heart throughout the body. Hypertension during pregnancy can cause problems for you and your baby. Your baby may be born early (prematurely) or may not weigh as much as he or she should at birth. Very bad cases of hypertension during pregnancy can be life-threatening. °Different types of hypertension can occur during pregnancy. These include: °· Chronic hypertension. This happens when: °? You have hypertension before pregnancy and it continues during pregnancy. °? You develop hypertension before you are [redacted] weeks pregnant, and it continues during pregnancy. °· Gestational hypertension. This is hypertension that develops after the 20th week of pregnancy. °· Preeclampsia, also called toxemia of pregnancy. This is a very serious type of hypertension that develops during pregnancy. It can be very dangerous for you and your baby. °? In rare cases, you may develop preeclampsia after giving birth (postpartum preeclampsia). This usually occurs within 48 hours after childbirth but may occur up to 6 weeks after giving birth. °Gestational hypertension and preeclampsia usually go away within 6 weeks after your baby is born. Women who have hypertension during pregnancy have a greater chance of developing hypertension later in life or during future pregnancies. °What are the causes? °The exact cause of hypertension during pregnancy is not known. °What increases the risk? °There are certain factors that make it more likely for you to develop hypertension during pregnancy. These include: °· Having hypertension during a previous pregnancy or prior to pregnancy. °· Being overweight. °· Being age 35 or older. °· Being pregnant for the first time. °· Being pregnant with more than one baby. °· Becoming pregnant using fertilization  methods such as IVF (in vitro fertilization). °· Having diabetes, kidney problems, or systemic lupus erythematosus. °· Having a family history of hypertension. °What are the signs or symptoms? °Chronic hypertension and gestational hypertension rarely cause symptoms. Preeclampsia causes symptoms, which may include: °· Increased protein in your urine. Your health care provider will check for this at every visit before you give birth (prenatal visit). °· Severe headaches. °· Sudden weight gain. °· Swelling of the hands, face, legs, and feet. °· Nausea and vomiting. °· Vision problems, such as blurred or double vision. °· Numbness in the face, arms, legs, and feet. °· Dizziness. °· Slurred speech. °· Sensitivity to bright lights. °· Abdominal pain. °· Convulsions or seizures. °How is this diagnosed? °You may be diagnosed with hypertension during a routine prenatal exam. At each prenatal visit, you may: °· Have a urine test to check for high amounts of protein in your urine. °· Have your blood pressure checked. A blood pressure reading is given as two numbers, such as "120 over 80" (or 120/80). The first ("top") number is a measure of the pressure in your arteries when your heart beats (systolic pressure). The second ("bottom") number is a measure of the pressure in your arteries as your heart relaxes between beats (diastolic pressure). Blood pressure is measured in a unit called mm Hg. For most women, a normal blood pressure reading is: °? Systolic: below 120. °? Diastolic: below 80. °The type of hypertension that you are diagnosed with depends on your test results and when your symptoms developed. °· Chronic hypertension is usually diagnosed before 20 weeks of pregnancy. °· Gestational hypertension is usually diagnosed after 20 weeks of pregnancy. °· Hypertension with high amounts of protein in   the urine is diagnosed as preeclampsia. °· Blood pressure measurements that stay above 160 systolic, or above 110 diastolic,  are signs of severe preeclampsia. °How is this treated? °Treatment for hypertension during pregnancy varies depending on the type of hypertension you have and how serious it is. °· If you take medicines called ACE inhibitors to treat chronic hypertension, you may need to switch medicines. ACE inhibitors should not be taken during pregnancy. °· If you have gestational hypertension, you may need to take blood pressure medicine. °· If you are at risk for preeclampsia, your health care provider may recommend that you take a low-dose aspirin during your pregnancy. °· If you have severe preeclampsia, you may need to be hospitalized so you and your baby can be monitored closely. You may also need to take medicine (magnesium sulfate) to prevent seizures and to lower blood pressure. This medicine may be given as an injection or through an IV. °· In some cases, if your condition gets worse, you may need to deliver your baby early. °Follow these instructions at home: °Eating and drinking ° °· Drink enough fluid to keep your urine pale yellow. °· Avoid caffeine. °Lifestyle °· Do not use any products that contain nicotine or tobacco, such as cigarettes and e-cigarettes. If you need help quitting, ask your health care provider. °· Do not use alcohol or drugs. °· Avoid stress as much as possible. Rest and get plenty of sleep. °General instructions °· Take over-the-counter and prescription medicines only as told by your health care provider. °· While lying down, lie on your left side. This keeps pressure off your major blood vessels. °· While sitting or lying down, raise (elevate) your feet. Try putting some pillows under your lower legs. °· Exercise regularly. Ask your health care provider what kinds of exercise are best for you. °· Keep all prenatal and follow-up visits as told by your health care provider. This is important. °Contact a health care provider if: °· You have symptoms that your health care provider told you may  require more treatment or monitoring, such as: °? Nausea or vomiting. °? Headache. °Get help right away if you have: °· Severe abdominal pain that does not get better with treatment. °· A severe headache that does not get better. °· Vomiting that does not get better. °· Sudden, rapid weight gain. °· Sudden swelling in your hands, ankles, or face. °· Vaginal bleeding. °· Blood in your urine. °· Fewer movements from your baby than usual. °· Blurred or double vision. °· Muscle twitching or sudden muscle tightening (spasms). °· Shortness of breath. °· Blue fingernails or lips. °Summary °· Hypertension, commonly called high blood pressure, is when the force of blood pumping through your arteries is too strong. °· Hypertension during pregnancy can cause problems for you and your baby. °· Treatment for hypertension during pregnancy varies depending on the type of hypertension you have and how serious it is. °· Get help right away if you have symptoms that your health care provider told you to watch for. °This information is not intended to replace advice given to you by your health care provider. Make sure you discuss any questions you have with your health care provider. °Document Released: 02/25/2011 Document Revised: 05/26/2017 Document Reviewed: 11/23/2015 °Elsevier Interactive Patient Education © 2019 [employer] ° °

## 2018-08-09 ENCOUNTER — Ambulatory Visit (INDEPENDENT_AMBULATORY_CARE_PROVIDER_SITE_OTHER): Payer: BC Managed Care – PPO | Admitting: Obstetrics & Gynecology

## 2018-08-09 ENCOUNTER — Other Ambulatory Visit: Payer: BC Managed Care – PPO

## 2018-08-09 VITALS — BP 120/80 | Wt 294.0 lb

## 2018-08-09 DIAGNOSIS — O10912 Unspecified pre-existing hypertension complicating pregnancy, second trimester: Secondary | ICD-10-CM

## 2018-08-09 DIAGNOSIS — Z3A27 27 weeks gestation of pregnancy: Secondary | ICD-10-CM

## 2018-08-09 DIAGNOSIS — Z131 Encounter for screening for diabetes mellitus: Secondary | ICD-10-CM

## 2018-08-09 DIAGNOSIS — O99212 Obesity complicating pregnancy, second trimester: Secondary | ICD-10-CM

## 2018-08-09 DIAGNOSIS — O10919 Unspecified pre-existing hypertension complicating pregnancy, unspecified trimester: Secondary | ICD-10-CM

## 2018-08-09 DIAGNOSIS — O0990 Supervision of high risk pregnancy, unspecified, unspecified trimester: Secondary | ICD-10-CM

## 2018-08-09 DIAGNOSIS — O99210 Obesity complicating pregnancy, unspecified trimester: Secondary | ICD-10-CM

## 2018-08-09 LAB — POCT URINALYSIS DIPSTICK OB
Glucose, UA: NEGATIVE
POC,PROTEIN,UA: NEGATIVE

## 2018-08-09 NOTE — Progress Notes (Signed)
  Subjective  Fetal Movement? yes Contractions? no Leaking Fluid? no Vaginal Bleeding? no  Objective  BP 120/80   Wt 294 lb (133.4 kg)   LMP 01/27/2018 (Exact Date)   BMI 43.42 kg/m  General: NAD Pumonary: no increased work of breathing Abdomen: gravid, non-tender Extremities: no edema Psychiatric: mood appropriate, affect full  Assessment  28 y.o. G1P0000 at [redacted]w[redacted]d by  11/03/2018, by Last Menstrual Period presenting for routine prenatal visit  Plan   Problem List Items Addressed This Visit      Cardiovascular and Mediastinum   Chronic hypertension during pregnancy, antepartum   Relevant Orders   US OB Follow Up- growth, AFI All normal BPs so far, expect slight increase in third trimester   Supervision of high risk pregnancy, antepartum   Maternal obesity, antepartum   Relevant Orders   US OB Follow Up Baby ASA daily   [redacted] weeks gestation of pregnancy    -  Primary   Relevant Orders   POC Urinalysis Dipstick OB (Completed) Glucola today    Breast feeding Prog only pill  Barnett Applebaum, MD, [place] 08/09/2018  9:20 AM

## 2018-08-09 NOTE — Patient Instructions (Signed)
Third Trimester of Pregnancy The third trimester is from week 28 through week 40 (months 7 through 9). The third trimester is a time when the unborn baby (fetus) is growing rapidly. At the end of the ninth month, the fetus is about 20 inches in length and weighs 6-10 pounds. Body changes during your third trimester Your body will continue to go through many changes during pregnancy. The changes vary from woman to woman. During the third trimester:  Your weight will continue to increase. You can expect to gain 25-35 pounds (11-16 kg) by the end of the pregnancy.  You may begin to get stretch marks on your hips, abdomen, and breasts.  You may urinate more often because the fetus is moving lower into your pelvis and pressing on your bladder.  You may develop or continue to have heartburn. This is caused by increased hormones that slow down muscles in the digestive tract.  You may develop or continue to have constipation because increased hormones slow digestion and cause the muscles that push waste through your intestines to relax.  You may develop hemorrhoids. These are swollen veins (varicose veins) in the rectum that can itch or be painful.  You may develop swollen, bulging veins (varicose veins) in your legs.  You may have increased body aches in the pelvis, back, or thighs. This is due to weight gain and increased hormones that are relaxing your joints.  You may have changes in your hair. These can include thickening of your hair, rapid growth, and changes in texture. Some women also have hair loss during or after pregnancy, or hair that feels dry or thin. Your hair will most likely return to normal after your baby is born.  Your breasts will continue to grow and they will continue to become tender. A yellow fluid (colostrum) may leak from your breasts. This is the first milk you are producing for your baby.  Your belly button may stick out.  You may notice more swelling in your hands,  face, or ankles.  You may have increased tingling or numbness in your hands, arms, and legs. The skin on your belly may also feel numb.  You may feel short of breath because of your expanding uterus.  You may have more problems sleeping. This can be caused by the size of your belly, increased need to urinate, and an increase in your body's metabolism.  You may notice the fetus "dropping," or moving lower in your abdomen (lightening).  You may have increased vaginal discharge.  You may notice your joints feel loose and you may have pain around your pelvic bone. What to expect at prenatal visits You will have prenatal exams every 2 weeks until week 36. Then you will have weekly prenatal exams. During a routine prenatal visit:  You will be weighed to make sure you and the baby are growing normally.  Your blood pressure will be taken.  Your abdomen will be measured to track your baby's growth.  The fetal heartbeat will be listened to.  Any test results from the previous visit will be discussed.  You may have a cervical check near your due date to see if your cervix has softened or thinned (effaced).  You will be tested for Group B streptococcus. This happens between 35 and 37 weeks. Your health care provider may ask you:  What your birth plan is.  How you are feeling.  If you are feeling the baby move.  If you have had any abnormal   symptoms, such as leaking fluid, bleeding, severe headaches, or abdominal cramping.  If you are using any tobacco products, including cigarettes, chewing tobacco, and electronic cigarettes.  If you have any questions. Other tests or screenings that may be performed during your third trimester include:  Blood tests that check for low iron levels (anemia).  Fetal testing to check the health, activity level, and growth of the fetus. Testing is done if you have certain medical conditions or if there are problems during the pregnancy.  Nonstress test  (NST). This test checks the health of your baby to make sure there are no signs of problems, such as the baby not getting enough oxygen. During this test, a belt is placed around your belly. The baby is made to move, and its heart rate is monitored during movement. What is false labor? False labor is a condition in which you feel small, irregular tightenings of the muscles in the womb (contractions) that usually go away with rest, changing position, or drinking water. These are called Braxton Hicks contractions. Contractions may last for hours, days, or even weeks before true labor sets in. If contractions come at regular intervals, become more frequent, increase in intensity, or become painful, you should see your health care provider. What are the signs of labor?  Abdominal cramps.  Regular contractions that start at 10 minutes apart and become stronger and more frequent with time.  Contractions that start on the top of the uterus and spread down to the lower abdomen and back.  Increased pelvic pressure and dull back pain.  A watery or bloody mucus discharge that comes from the vagina.  Leaking of amniotic fluid. This is also known as your "water breaking." It could be a slow trickle or a gush. Let your health care provider know if it has a color or strange odor. If you have any of these signs, call your health care provider right away, even if it is before your due date. Follow these instructions at home: Medicines  Follow your health care provider's instructions regarding medicine use. Specific medicines may be either safe or unsafe to take during pregnancy.  Take a prenatal vitamin that contains at least 600 micrograms (mcg) of folic acid.  If you develop constipation, try taking a stool softener if your health care provider approves. Eating and drinking   Eat a balanced diet that includes fresh fruits and vegetables, whole grains, good sources of protein such as meat, eggs, or tofu,  and low-fat dairy. Your health care provider will help you determine the amount of weight gain that is right for you.  Avoid raw meat and uncooked cheese. These carry germs that can cause birth defects in the baby.  If you have low calcium intake from food, talk to your health care provider about whether you should take a daily calcium supplement.  Eat four or five small meals rather than three large meals a day.  Limit foods that are high in fat and processed sugars, such as fried and sweet foods.  To prevent constipation: ? Drink enough fluid to keep your urine clear or pale yellow. ? Eat foods that are high in fiber, such as fresh fruits and vegetables, whole grains, and beans. Activity  Exercise only as directed by your health care provider. Most women can continue their usual exercise routine during pregnancy. Try to exercise for 30 minutes at least 5 days a week. Stop exercising if you experience uterine contractions.  Avoid heavy lifting.  Do   not exercise in extreme heat or humidity, or at high altitudes.  Wear low-heel, comfortable shoes.  Practice good posture.  You may continue to have sex unless your health care provider tells you otherwise. Relieving pain and discomfort  Take frequent breaks and rest with your legs elevated if you have leg cramps or low back pain.  Take warm sitz baths to soothe any pain or discomfort caused by hemorrhoids. Use hemorrhoid cream if your health care provider approves.  Wear a good support bra to prevent discomfort from breast tenderness.  If you develop varicose veins: ? Wear support pantyhose or compression stockings as told by your healthcare provider. ? Elevate your feet for 15 minutes, 3-4 times a day. Prenatal care  Write down your questions. Take them to your prenatal visits.  Keep all your prenatal visits as told by your health care provider. This is important. Safety  Wear your seat belt at all times when driving.  Make  a list of emergency phone numbers, including numbers for family, friends, the hospital, and police and fire departments. General instructions  Avoid cat litter boxes and soil used by cats. These carry germs that can cause birth defects in the baby. If you have a cat, ask someone to clean the litter box for you.  Do not travel far distances unless it is absolutely necessary and only with the approval of your health care provider.  Do not use hot tubs, steam rooms, or saunas.  Do not drink alcohol.  Do not use any products that contain nicotine or tobacco, such as cigarettes and e-cigarettes. If you need help quitting, ask your health care provider.  Do not use any medicinal herbs or unprescribed drugs. These chemicals affect the formation and growth of the baby.  Do not douche or use tampons or scented sanitary pads.  Do not cross your legs for long periods of time.  To prepare for the arrival of your baby: ? Take prenatal classes to understand, practice, and ask questions about labor and delivery. ? Make a trial run to the hospital. ? Visit the hospital and tour the maternity area. ? Arrange for maternity or paternity leave through employers. ? Arrange for family and friends to take care of pets while you are in the hospital. ? Purchase a rear-facing car seat and make sure you know how to install it in your car. ? Pack your hospital bag. ? Prepare the baby's nursery. Make sure to remove all pillows and stuffed animals from the baby's crib to prevent suffocation.  Visit your dentist if you have not gone during your pregnancy. Use a soft toothbrush to brush your teeth and be gentle when you floss. Contact a health care provider if:  You are unsure if you are in labor or if your water has broken.  You become dizzy.  You have mild pelvic cramps, pelvic pressure, or nagging pain in your abdominal area.  You have lower back pain.  You have persistent nausea, vomiting, or  diarrhea.  You have an unusual or bad smelling vaginal discharge.  You have pain when you urinate. Get help right away if:  Your water breaks before 37 weeks.  You have regular contractions less than 5 minutes apart before 37 weeks.  You have a fever.  You are leaking fluid from your vagina.  You have spotting or bleeding from your vagina.  You have severe abdominal pain or cramping.  You have rapid weight loss or weight gain.  You have   shortness of breath with chest pain.  You notice sudden or extreme swelling of your face, hands, ankles, feet, or legs.  Your baby makes fewer than 10 movements in 2 hours.  You have severe headaches that do not go away when you take medicine.  You have vision changes. Summary  The third trimester is from week 28 through week 40, months 7 through 9. The third trimester is a time when the unborn baby (fetus) is growing rapidly.  During the third trimester, your discomfort may increase as you and your baby continue to gain weight. You may have abdominal, leg, and back pain, sleeping problems, and an increased need to urinate.  During the third trimester your breasts will keep growing and they will continue to become tender. A yellow fluid (colostrum) may leak from your breasts. This is the first milk you are producing for your baby.  False labor is a condition in which you feel small, irregular tightenings of the muscles in the womb (contractions) that eventually go away. These are called Braxton Hicks contractions. Contractions may last for hours, days, or even weeks before true labor sets in.  Signs of labor can include: abdominal cramps; regular contractions that start at 10 minutes apart and become stronger and more frequent with time; watery or bloody mucus discharge that comes from the vagina; increased pelvic pressure and dull back pain; and leaking of amniotic fluid. This information is not intended to replace advice given to you by your  health care provider. Make sure you discuss any questions you have with your health care provider. Document Released: 06/03/2001 Document Revised: 07/15/2016 Document Reviewed: 07/15/2016 Elsevier Interactive Patient Education  2019 [employer]  

## 2018-08-10 LAB — 28 WEEK RH+PANEL
Basophils Absolute: 0.1 10*3/uL (ref 0.0–0.2)
Basos: 0 %
EOS (ABSOLUTE): 0.1 10*3/uL (ref 0.0–0.4)
EOS: 1 %
Gestational Diabetes Screen: 102 mg/dL (ref 65–139)
HIV Screen 4th Generation wRfx: NONREACTIVE
Hematocrit: 35.1 % (ref 34.0–46.6)
Hemoglobin: 11.7 g/dL (ref 11.1–15.9)
Immature Grans (Abs): 0.2 10*3/uL — ABNORMAL HIGH (ref 0.0–0.1)
Immature Granulocytes: 1 %
Lymphocytes Absolute: 3 10*3/uL (ref 0.7–3.1)
Lymphs: 20 %
MCH: 28.7 pg (ref 26.6–33.0)
MCHC: 33.3 g/dL (ref 31.5–35.7)
MCV: 86 fL (ref 79–97)
Monocytes Absolute: 0.7 10*3/uL (ref 0.1–0.9)
Monocytes: 5 %
Neutrophils Absolute: 10.7 10*3/uL — ABNORMAL HIGH (ref 1.4–7.0)
Neutrophils: 73 %
PLATELETS: 382 10*3/uL (ref 150–450)
RBC: 4.08 x10E6/uL (ref 3.77–5.28)
RDW: 12.4 % (ref 11.7–15.4)
RPR Ser Ql: NONREACTIVE
WBC: 14.7 10*3/uL — ABNORMAL HIGH (ref 3.4–10.8)

## 2018-08-31 ENCOUNTER — Ambulatory Visit (INDEPENDENT_AMBULATORY_CARE_PROVIDER_SITE_OTHER): Payer: BC Managed Care – PPO

## 2018-08-31 ENCOUNTER — Ambulatory Visit (INDEPENDENT_AMBULATORY_CARE_PROVIDER_SITE_OTHER): Payer: BC Managed Care – PPO | Admitting: Obstetrics and Gynecology

## 2018-08-31 ENCOUNTER — Encounter: Payer: Self-pay | Admitting: Obstetrics and Gynecology

## 2018-08-31 VITALS — BP 110/72 | Wt 300.0 lb

## 2018-08-31 DIAGNOSIS — O10913 Unspecified pre-existing hypertension complicating pregnancy, third trimester: Secondary | ICD-10-CM

## 2018-08-31 DIAGNOSIS — Z3A30 30 weeks gestation of pregnancy: Secondary | ICD-10-CM

## 2018-08-31 DIAGNOSIS — Z23 Encounter for immunization: Secondary | ICD-10-CM | POA: Diagnosis not present

## 2018-08-31 DIAGNOSIS — O99213 Obesity complicating pregnancy, third trimester: Secondary | ICD-10-CM | POA: Diagnosis not present

## 2018-08-31 DIAGNOSIS — O0990 Supervision of high risk pregnancy, unspecified, unspecified trimester: Secondary | ICD-10-CM

## 2018-08-31 DIAGNOSIS — O99210 Obesity complicating pregnancy, unspecified trimester: Secondary | ICD-10-CM

## 2018-08-31 DIAGNOSIS — O10919 Unspecified pre-existing hypertension complicating pregnancy, unspecified trimester: Secondary | ICD-10-CM

## 2018-08-31 NOTE — Progress Notes (Signed)
Routine Prenatal Care Visit  Subjective  Claudia Byrd is a 28 y.o. G1P0000 at [redacted]w[redacted]d being seen today for ongoing prenatal care.  She is currently monitored for the following issues for this high-risk pregnancy and has Supervision of high risk pregnancy, antepartum; Chronic hypertension during pregnancy, antepartum; and Maternal obesity, antepartum on their problem list.  ----------------------------------------------------------------------------------- Patient reports no complaints.   Contractions: Not present. Vag. Bleeding: None.  Movement: Present. Denies leaking of fluid.  ----------------------------------------------------------------------------------- The following portions of the patient's history were reviewed and updated as appropriate: allergies, current medications, past family history, past medical history, past social history, past surgical history and problem list. Problem list updated.   Objective  Blood pressure 110/72, weight 300 lb (136.1 kg), last menstrual period 01/27/2018. Pregravid weight 260 lb (117.9 kg) Total Weight Gain 40 lb (18.1 kg) Urinalysis:      Fetal Status: Fetal Heart Rate (bpm): 145 Fundal Height: 36 cm Movement: Present     General:  Alert, oriented and cooperative. Patient is in no acute distress.  Skin: Skin is warm and dry. No rash noted.   Cardiovascular: Normal heart rate noted  Respiratory: Normal respiratory effort, no problems with respiration noted  Abdomen: Soft, gravid, appropriate for gestational age. Pain/Pressure: Absent     Pelvic:  Cervical exam deferred        Extremities: Normal range of motion.  Edema: None  Mental Status: Normal mood and affect. Normal behavior. Normal judgment and thought content.     Assessment   28 y.o. G1P0000 at [redacted]w[redacted]d by  11/03/2018, by Last Menstrual Period presenting for routine prenatal visit  Plan   Pregnancy #1 Problems (from 01/27/18 to present)    Problem Noted Resolved   Supervision of  high risk pregnancy, antepartum 03/10/2018 by [name], MD No   Overview Addendum 08/31/2018  5:15 PM by Homero Fellers, MD    [facility]inic [facility] Prenatal Labs  Dating LMP = 7 week [place] Blood type: O/Positive/-- (09/18 1153)   Genetic Screen NIPS:Normal XX Inheritest: SMA, CF, Fragile-X negative Antibody:Negative (09/18 1153)  Anatomic [place] Incomplete 4-chamber and outflow tracts [x]  Complete 24 weeks Rubella: 5.81 (09/18 1153)  Varicella: Immune  GTT Early:111    Third trimester: 102 RPR: Non Reactive (09/18 1153)   Rhogam N/A HBsAg: Negative (09/18 1153)   TDaP vaccine   08/31/2018 Flu Shot: 03/19/2018 HIV: Non Reactive (09/18 1153)   Baby Food  Breast                               GBS:   Contraception  POP Pap: 03/10/18 NIL  CBB     CS/VBAC N/A  [ ]  anesthesia referral  Support Person Husband [name]           Chronic hypertension during pregnancy, antepartum 03/10/2018 by [name], MD No   Overview Addendum 04/16/2018  9:30 AM by [name], MD    [X]  Aspirin 81 mg daily after 12 weeks; discontinue after 36 weeks [X]  baseline labs with CBC, CMP, urine protein/creatinine ratio [ ]  no BP meds unless BPs become elevated [ ]  ultrasound for growth at 28, 32, 36 weeks  Current antihypertensives:  None   Baseline and surveillance labs (pulled in from [facility], refresh links as needed)  Lab Results  Component Value Date   PLT 379 03/10/2018   CREATININE 0.70 03/10/2018   AST 18 03/10/2018   ALT 10 03/10/2018  Antenatal Testing CHTN - O10.919  Group I  BP < 140/90, no preeclampsia, AGA,  nml AFV, +/- meds    Group II BP > 140/90, on meds, no preeclampsia, AGA, nml AFV  20-28-34-38  20-24-28-32-35-38  32//2 x wk  28//BPP wkly then 32//2 x wk  40 no meds; 39 meds  PRN or 37  Pre-eclampsia  GHTN - O13.9/Preeclampsia without severe features  - O14.00   Preeclampsia with severe features - O14.10  Q 3-4wks  Q 2 wks  28//BPP wkly then 32//2 x  wk  Inpatient  37  PRN or 34        Maternal obesity, antepartum 03/10/2018 by [name], MD No       Gestational age appropriate obstetric precautions including but not limited to vaginal bleeding, contractions, leaking of fluid and fetal movement were reviewed in detail with the patient.    Growth WNL today- follow up US in 4 weeks Will need anesthesia referral for BMI- please make at next visit Discussed breast feeding, given ready set baby brochure, given class list, given rx for breast pump form, given book 40lbs of weight gain so far this pregnancy  Return in about 2 weeks (around 09/14/2018) for [place].  Homero Fellers MD [facility], [facility] 08/31/2018, 5:55 PM

## 2018-08-31 NOTE — Progress Notes (Signed)
ROB/US BT consent and Tdap today  Some round ligament pain

## 2018-09-13 ENCOUNTER — Ambulatory Visit (INDEPENDENT_AMBULATORY_CARE_PROVIDER_SITE_OTHER): Payer: BC Managed Care – PPO | Admitting: Obstetrics and Gynecology

## 2018-09-13 ENCOUNTER — Other Ambulatory Visit: Payer: Self-pay

## 2018-09-13 VITALS — BP 124/66 | Wt 300.0 lb

## 2018-09-13 DIAGNOSIS — O10919 Unspecified pre-existing hypertension complicating pregnancy, unspecified trimester: Secondary | ICD-10-CM

## 2018-09-13 DIAGNOSIS — O99213 Obesity complicating pregnancy, third trimester: Secondary | ICD-10-CM

## 2018-09-13 DIAGNOSIS — Z3A28 28 weeks gestation of pregnancy: Secondary | ICD-10-CM

## 2018-09-13 DIAGNOSIS — O10913 Unspecified pre-existing hypertension complicating pregnancy, third trimester: Secondary | ICD-10-CM

## 2018-09-13 DIAGNOSIS — O0990 Supervision of high risk pregnancy, unspecified, unspecified trimester: Secondary | ICD-10-CM

## 2018-09-13 DIAGNOSIS — O99210 Obesity complicating pregnancy, unspecified trimester: Secondary | ICD-10-CM

## 2018-09-13 NOTE — Progress Notes (Signed)
Routine Prenatal Care Visit  Subjective  Claudia Byrd is a 28 y.o. G1P0000 at [redacted]w[redacted]d being seen today for ongoing prenatal care.  She is currently monitored for the following issues for this high-risk pregnancy and has Supervision of high risk pregnancy, antepartum; Chronic hypertension during pregnancy, antepartum; and Maternal obesity, antepartum on their problem list.  ----------------------------------------------------------------------------------- Patient reports no complaints.   Contractions: Not present. Vag. Bleeding: None.  Movement: Present. Denies leaking of fluid.  ----------------------------------------------------------------------------------- The following portions of the patient's history were reviewed and updated as appropriate: allergies, current medications, past family history, past medical history, past social history, past surgical history and problem list. Problem list updated.   Objective  Blood pressure 124/66, weight 300 lb (136.1 kg), last menstrual period 01/27/2018. Body mass index is 44.3 kg/m.  Pregravid weight 260 lb (117.9 kg) Total Weight Gain 40 lb (18.1 kg) Urinalysis:      Fetal Status: Fetal Heart Rate (bpm): 140   Movement: Present     General:  Alert, oriented and cooperative. Patient is in no acute distress.  Skin: Skin is warm and dry. No rash noted.   Cardiovascular: Normal heart rate noted  Respiratory: Normal respiratory effort, no problems with respiration noted  Abdomen: Soft, gravid, appropriate for gestational age. Pain/Pressure: Absent     Pelvic:  Cervical exam deferred        Extremities: Normal range of motion.     ental Status: Normal mood and affect. Normal behavior. Normal judgment and thought content.   No results found.   Assessment   28 y.o. G1P0000 at [redacted]w[redacted]d by  11/03/2018, by Last Menstrual Period presenting for routine prenatal visit  Plan   Pregnancy #1 Problems (from 01/27/18 to present)    Problem Noted  Resolved   Supervision of high risk pregnancy, antepartum 03/10/2018 by [name], MD No   Overview Addendum 08/31/2018  5:15 PM by Homero Fellers, MD    [facility] Prenatal Labs  Dating LMP = 7 week [place] Blood type: O/Positive/-- (09/18 1153)   Genetic Screen NIPS:Normal XX Inheritest: SMA, CF, Fragile-X negative Antibody:Negative (09/18 1153)  Anatomic [place] Incomplete 4-chamber and outflow tracts [x]  Complete 24 weeks Rubella: 5.81 (09/18 1153)  Varicella: Immune  GTT Early:111    Third trimester: 102 RPR: Non Reactive (09/18 1153)   Rhogam N/A HBsAg: Negative (09/18 1153)   TDaP vaccine   08/31/2018 Flu Shot: 03/19/2018 HIV: Non Reactive (09/18 1153)   Baby Food  Breast                               GBS:   Contraception  POP Pap: 03/10/18 NIL  CBB     CS/VBAC N/A  [ ]  anesthesia referral  Support Person Husband [name]           Chronic hypertension during pregnancy, antepartum 03/10/2018 by [name], MD No   Overview Addendum 04/16/2018  9:30 AM by [name], MD    [X]  Aspirin 81 mg daily after 12 weeks; discontinue after 36 weeks [X]  baseline labs with CBC, CMP, urine protein/creatinine ratio [ ]  no BP meds unless BPs become elevated [ ]  ultrasound for growth at 28, 32, 36 weeks  Current antihypertensives:  None   Baseline and surveillance labs (pulled in from [facility], refresh links as needed)  Lab Results  Component Value Date   PLT 379 03/10/2018   CREATININE 0.70 03/10/2018   AST 18  03/10/2018   ALT 10 03/10/2018    Antenatal Testing CHTN - O10.919  Group I  BP < 140/90, no preeclampsia, AGA,  nml AFV, +/- meds    Group II BP > 140/90, on meds, no preeclampsia, AGA, nml AFV  20-28-34-38  20-24-28-32-35-38  32//2 x wk  28//BPP wkly then 32//2 x wk  40 no meds; 39 meds  PRN or 37  Pre-eclampsia  GHTN - O13.9/Preeclampsia without severe features  - O14.00   Preeclampsia with severe features - O14.10  Q 3-4wks  Q 2 wks  28//BPP  wkly then 32//2 x wk  Inpatient  37  PRN or 34        Maternal obesity, antepartum 03/10/2018 by [name], MD No       Gestational age appropriate obstetric precautions including but not limited to vaginal bleeding, contractions, leaking of fluid and fetal movement were reviewed in detail with the patient.    Return in about 2 weeks (around 09/27/2018) for ROB 2 weeks, ROB and growth scan 4 weeks.  [name], MD, [facility], [place]

## 2018-09-13 NOTE — Progress Notes (Signed)
ROB

## 2018-09-15 ENCOUNTER — Encounter: Payer: Self-pay | Admitting: Obstetrics and Gynecology

## 2018-09-15 NOTE — Progress Notes (Signed)
   September 15, 2018   Patient: Claudia Byrd  Date of Birth: 1991/03/12  Date of Visit: 09/15/2018    To Whom It May Concern:  It is my medical opinion that [name] should work remotely from home if feasible.  Per the CDC "We do not currently know if pregnant women have a greater chance of getting sick from COVID-19 than the general public nor whether they are more likely to have serious illness as a result. Pregnant women experience changes in their bodies that may increase their risk of some infections. With viruses from the same family as COVID-47, and other viral respiratory infections, such as influenza, women have had a higher risk of developing severe illness. It is always important for pregnant women to protect themselves from illnesses."  It is reasonable to assume that pregnant patient are at increased risk.  Therefore as with any other employ that may have pre-existing medical co-morbidities that increase their risk should they contract COVID-19 the recommendation would be to promote social distancing as much as feasible, encourage good hand hygiene, and ideally work remotely from home if possible.     If you have any questions or concerns, please don't hesitate to call.  Sincerely,  [name], MD, [facility], [facility] 09/15/2018, 8:42 AM

## 2018-09-28 ENCOUNTER — Encounter: Payer: BC Managed Care – PPO | Admitting: Obstetrics and Gynecology

## 2018-09-30 ENCOUNTER — Encounter: Payer: Self-pay | Admitting: Obstetrics & Gynecology

## 2018-09-30 ENCOUNTER — Other Ambulatory Visit: Payer: Self-pay

## 2018-09-30 ENCOUNTER — Ambulatory Visit (INDEPENDENT_AMBULATORY_CARE_PROVIDER_SITE_OTHER): Payer: BC Managed Care – PPO | Admitting: Obstetrics & Gynecology

## 2018-09-30 VITALS — BP 121/73 | Wt 300.0 lb

## 2018-09-30 DIAGNOSIS — O10913 Unspecified pre-existing hypertension complicating pregnancy, third trimester: Secondary | ICD-10-CM

## 2018-09-30 DIAGNOSIS — O99213 Obesity complicating pregnancy, third trimester: Secondary | ICD-10-CM

## 2018-09-30 DIAGNOSIS — O99210 Obesity complicating pregnancy, unspecified trimester: Secondary | ICD-10-CM

## 2018-09-30 DIAGNOSIS — O0990 Supervision of high risk pregnancy, unspecified, unspecified trimester: Secondary | ICD-10-CM

## 2018-09-30 DIAGNOSIS — Z3A35 35 weeks gestation of pregnancy: Secondary | ICD-10-CM

## 2018-09-30 NOTE — Progress Notes (Signed)
Virtual Visit via Telephone Note  I connected with@ on 09/30/18 at  9:20 AM EDT by telephone and verified that I am speaking with the correct person using two identifiers.   I discussed the limitations, risks, security and privacy concerns of performing an evaluation and management service by telephone and the availability of in person appointments. I also discussed with the patient that there may be a patient responsible charge related to this service. The patient expressed understanding and agreed to proceed.  The patient was at home I spoke with the patient from my  office  Claudia Byrd is a 28 y.o. G1P0000 at [redacted]w[redacted]d being seen today for ongoing prenatal care.  She is currently monitored for the following issues for this high-risk pregnancy and has Supervision of high risk pregnancy, antepartum; Chronic hypertension during pregnancy, antepartum; and Maternal obesity, antepartum on their problem list.  ----------------------------------------------------------------------------------- Patient reports no complaints.   Denies pain, VB, leaking of fluid.  ----------------------------------------------------------------------------------- The following portions of the patient's history were reviewed and updated as appropriate: allergies, current medications, past family history, past medical history, past social history, past surgical history and problem list. Problem list updated.   Objective  Blood pressure 121/73, weight 300 lb (136.1 kg), last menstrual period 01/27/2018. Pregravid weight 260 lb (117.9 kg) Total Weight Gain 40 lb (18.1 kg)  Physical Exam could not be performed. Because of the COVID-19 outbreak this visit was performed over the phone and not in person.   Assessment   28 y.o. G1P0000 at [redacted]w[redacted]d by  11/03/2018, by Last Menstrual Period presenting for routine prenatal visit  Plan   Pregnancy #1 Problems (from 01/27/18 to present)    Problem Noted Resolved   Supervision of  high risk pregnancy, antepartum 03/10/2018 by [name], MD No   Overview Addendum 08/31/2018  5:15 PM by Homero Fellers, MD    [facility]inic [facility] Prenatal Labs  Dating LMP = 7 week [place] Blood type: O/Positive/-- (09/18 1153)   Genetic Screen NIPS:Normal XX Inheritest: SMA, CF, Fragile-X negative Antibody:Negative (09/18 1153)  Anatomic [place] Incomplete 4-chamber and outflow tracts [x]  Complete 24 weeks Rubella: 5.81 (09/18 1153)  Varicella: Immune  GTT Early:111    Third trimester: 102 RPR: Non Reactive (09/18 1153)   Rhogam N/A HBsAg: Negative (09/18 1153)   TDaP vaccine   08/31/2018 Flu Shot: 03/19/2018 HIV: Non Reactive (09/18 1153)   Baby Food  Breast                               GBS:   Contraception  POP Pap: 03/10/18 NIL  CBB     CS/VBAC N/A  [ ]  anesthesia referral  Support Person Husband Claudia Byrd           Chronic hypertension during pregnancy, antepartum 03/10/2018 by [name], MD No   Overview Addendum 04/16/2018  9:30 AM by [name], MD    [X]  Aspirin 81 mg daily after 12 weeks; discontinue after 36 weeks [X]  baseline labs with CBC, CMP, urine protein/creatinine ratio [ ]  no BP meds unless BPs become elevated [ ]  ultrasound for growth at 28, 32, 36 weeks  Current antihypertensives:  None   Baseline and surveillance labs (pulled in from [facility], refresh links as needed)  Lab Results  Component Value Date   PLT 379 03/10/2018   CREATININE 0.70 03/10/2018   AST 18 03/10/2018   ALT 10 03/10/2018    Antenatal Testing CHTN -  O10.919  Group I  BP < 140/90, no preeclampsia, AGA,  nml AFV, +/- meds    Group II BP > 140/90, on meds, no preeclampsia, AGA, nml AFV  20-28-34-38  20-24-28-32-35-38  32//2 x wk  28//BPP wkly then 32//2 x wk  40 no meds; 39 meds  PRN or 37  Pre-eclampsia  GHTN - O13.9/Preeclampsia without severe features  - O14.00   Preeclampsia with severe features - O14.10  Q 3-4wks  Q 2 wks  28//BPP wkly then 32//2 x  wk  Inpatient  37  PRN or 34        Maternal obesity, antepartum 03/10/2018 by [name], MD No  BMI >=40 [x ] early 1h gtt -  [x ] u/s for dating [x ]  [x ] nutritional goals [x ] folic acid 1mg  [x ] bASA (>12 weeks) [x ] consider nutrition consult [x ] consider maternal EKG 1st trimester [x ] Growth u/s 56 [ x], 57 [x ], 36 weeks [planned ] [x ] NST/AFI weekly 36+ weeks (36[planned], 37[] , 38[] , 39[] , 40[] ) [x ] IOL by 41 weeks (scheduled, prn [] )    Gestational age appropriate obstetric precautions including but not limited to vaginal bleeding, contractions, leaking of fluid and fetal movement were reviewed in detail with the patient.    Follow Up Instructions: PNV, [place] nv for growth, AFI PTL precautions No recent HTN during this pregnancy, treat as routine based on cHTN risks Obesity RF discussed however and will cont to monitor, [place] nv w NST     Anes consult     I discussed the assessment and treatment plan with the patient. The patient was provided an opportunity to ask questions and all were answered. The patient agreed with the plan and demonstrated an understanding of the instructions.   The patient was advised to call back or seek an in-person evaluation if the symptoms worsen or if the condition fails to improve as anticipated.  I provided 10 minutes of non-face-to-face time during this encounter.  Return in about 1 week (around 10/07/2018) for [place] in office as scheduled 4/20.  Barnett Applebaum, MD [facility], [facility] 09/30/2018 9:33 AM

## 2018-09-30 NOTE — Patient Instructions (Signed)
COVID-19 and Your Pregnancy FAQ  How can I prevent infection with COVID-19 during my pregnancy? Social distancing is key. Please limit any interactions in public. Try and work from home if possible. Frequently wash your hands after touching possibly contaminated surfaces. Avoid touching your face.  Minimize trips to the store. Consider online ordering when possible.   Should I wear a mask? YES. It is recommended by the CDC that all people wear a cloth mask or facial covering in public. This will help reduce transmission as well as your risk or acquiring COVID-19. New studies are showing that even asymptomatic individuals can spread the virus from talking.   What are the symptoms of COVID-19? Fever (greater than 100.4 F), dry cough, shortness of breath.  Am I more at risk for COVID-19 since I am pregnant? There is not currently data showing that pregnant women are more adversely impacted by COVID-19 than the general population. However, we know that pregnant women tend to have worse respiratory complications from similar diseases such as the flu and SARS and for this reason should be considered an at-risk population.  What do I do if I am experiencing the symptoms of COVID-19? Testing is being limited because of test availability. If you are experiencing symptoms you should quarantine yourself, and the members of your family, for at least 2 weeks at home.   Please visit this website for more information: [email or web address]  When should I go to the Emergency Room? Please go to the emergency room if you are experiencing ANY of these symptoms*:  1.    Difficulty breathing or shortness of breath 2.    Persistent pain or pressure in the chest 3.    Confusion or difficulty being aroused (or awakened) 4.    Bluish lips or face  *This list is not all inclusive. Please consult our office for any other symptoms that are severe or  concerning.  What do I do if I am having difficulty breathing? You should go to the Emergency Room for evaluation. At this time they have a tent set up for evaluating patients with COVID-19 symptoms.   How will my prenatal care be different because of the COVID-19 pandemic? It has been recommended to reduce the frequency of face-to-face visits and use resources such as telephone and virtual visits when possible. Using a scale, blood pressure machine and fetal doppler at home can further help reduce face-to-face visits. You will be provided with additional information on this topic.  We ask that you come to your visits alone to minimize potential exposures to  COVID-19.  How can I receive childbirth education? At this time in-person classes have been cancelled. You can register for online childbirth education, breastfeeding, and newborn care classes.  Please visit:  CyberComps.hu for more information  How will my hospital birth experience be different? The hospital is currently limiting visitors. This means that while you are in labor you can only have one person at the hospital with you. Additional family members will not be allowed to wait in the building or outside your room. Your one support person can be the father of the baby, a relative, a doula, or a friend. Once one support person is designated that person will wear a band. This band cannot be shared with multiple people.  How long will I stay in the hospital for after giving birth? It is also recommended that discharge home be expedited during the COVID-19 outbreak. This means staying for 1 day after  a vaginal delivery and 2 days after a cesarean section.  What if I have COVID-19 and I am in labor? We ask that you wear a mask while on labor and delivery. We will try and accommodate you being placed in a room that is capable of filtering the air. Please call ahead if you are in labor and on your way to the hospital. The  phone number for labor and delivery at [facility] is [phone].  If I have COVID-19 when my baby is born how can I prevent my baby from contracting COVID-19? This is an issue that will have to be discussed on a case-by-case basis. Current recommendations suggest providing separate isolation rooms for both the mother and new infant as well as limiting visitors. However, there are practical challenges to this recommendation. The situation will assuredly change and decisions will be influenced by the desires of the mother and availability of space.  Some suggestions are the use of a curtain or physical barrier between mom and infant, hand hygiene, mom wearing a mask, or 6 feet of spacing between a mom and infant.   Can I breastfeed during the COVID-19 pandemic?   Yes, breastfeeding is encouraged.  Can I breastfeed if I have COVID-19? Yes. Covid-19 has not been found in breast milk. This means you cannot give COVID-19 to your child through breast milk. Breast feeding will also help pass antibodies to fight infection to your baby.   What precautions should I take when breastfeeding if I have COVID-19? If a mother and newborn do room-in and the mother wishes to feed at the breast, she should put on a facemask and practice hand hygiene before each feeding.  What precautions should I take when pumping if I have COVID-19? Prior to expressing breast milk, mothers should practice hand hygiene. After each pumping session, all parts that come into contact with breast milk should be thoroughly washed and the entire pump should be appropriately disinfected per the manufacturer's instructions. This expressed breast milk should be fed to the newborn by a healthy caregiver.  What if I am pregnant and work in healthcare? Based on limited data regarding COVID-19 and pregnancy, ACOG currently does not propose creating additional restrictions on pregnant health care personnel because of  COVID-19 alone. Pregnant women do not appear to be at higher risk of severe disease related to COVID-19. Pregnant health care personnel should follow CDC risk assessment and infection control guidelines for health care personnel exposed to patients with suspected or confirmed COVID-19. Adherence to recommended infection prevention and control practices is an important part of protecting all health care personnel in health care settings.    Information on COVID-19 in pregnancy is very limited; however, facilities may want to consider limiting exposure of pregnant health care personnel to patients with confirmed or suspected COVID-19 infection, especially during higher-risk procedures (eg, aerosol-generating procedures), if feasible, based on staffing availability.

## 2018-10-05 ENCOUNTER — Encounter
Admission: RE | Admit: 2018-10-05 | Discharge: 2018-10-05 | Disposition: A | Discharge status: Home / Self Care | Payer: BC Managed Care – PPO | Source: Ambulatory Visit | Attending: Anesthesiology | Admitting: Anesthesiology

## 2018-10-05 ENCOUNTER — Other Ambulatory Visit: Payer: BC Managed Care – PPO

## 2018-10-05 ENCOUNTER — Other Ambulatory Visit: Payer: Self-pay

## 2018-10-11 ENCOUNTER — Ambulatory Visit (INDEPENDENT_AMBULATORY_CARE_PROVIDER_SITE_OTHER): Payer: BC Managed Care – PPO | Admitting: Obstetrics and Gynecology

## 2018-10-11 ENCOUNTER — Ambulatory Visit (INDEPENDENT_AMBULATORY_CARE_PROVIDER_SITE_OTHER): Payer: BC Managed Care – PPO

## 2018-10-11 ENCOUNTER — Other Ambulatory Visit: Payer: Self-pay

## 2018-10-11 VITALS — BP 122/66 | Wt 305.0 lb

## 2018-10-11 DIAGNOSIS — Z880 Allergy status to penicillin: Secondary | ICD-10-CM

## 2018-10-11 DIAGNOSIS — O0990 Supervision of high risk pregnancy, unspecified, unspecified trimester: Secondary | ICD-10-CM

## 2018-10-11 DIAGNOSIS — O10919 Unspecified pre-existing hypertension complicating pregnancy, unspecified trimester: Secondary | ICD-10-CM

## 2018-10-11 DIAGNOSIS — O99210 Obesity complicating pregnancy, unspecified trimester: Secondary | ICD-10-CM

## 2018-10-11 DIAGNOSIS — Z362 Encounter for other antenatal screening follow-up: Secondary | ICD-10-CM

## 2018-10-11 DIAGNOSIS — O99213 Obesity complicating pregnancy, third trimester: Secondary | ICD-10-CM

## 2018-10-11 DIAGNOSIS — Z3A36 36 weeks gestation of pregnancy: Secondary | ICD-10-CM

## 2018-10-11 DIAGNOSIS — O10913 Unspecified pre-existing hypertension complicating pregnancy, third trimester: Secondary | ICD-10-CM

## 2018-10-11 DIAGNOSIS — Z3685 Encounter for antenatal screening for Streptococcus B: Secondary | ICD-10-CM

## 2018-10-11 NOTE — Consult Note (Signed)
[facility] Anesthesia Consultation  Claudia Byrd GHW:[id number] DOB: 09/21/1990 DOA: 10/05/2018 PCP: Patient, No Pcp Per   Requesting physician: Dr. Kenton Kingfisher Date of consultation: 10/05/18 Reason for consultation: Obesity during pregnancy  CHIEF COMPLAINT:  Obesity during pregnancy  HISTORY OF PRESENT ILLNESS: Claudia Byrd  is a 28 y.o. female with a known history of obesity during pregnancy and chronic hypertension. She is not currently on any antihypertensive medications and BP has been well controlled during pregnancy. Denies any hx of asthma. Denies personal or family hx of bleeding disorders.   PAST MEDICAL HISTORY:   Past Medical History:  Diagnosis Date  . Hypertension     PAST SURGICAL HISTORY: No past surgical history on file.  SOCIAL HISTORY:  Social History   Tobacco Use  . Smoking status: Never Smoker  . Smokeless tobacco: Never Used  Substance Use Topics  . Alcohol use: Never    Frequency: Never    FAMILY HISTORY: No family history on file.  DRUG ALLERGIES:  Allergies  Allergen Reactions  . Penicillins Itching  . Sulfa Antibiotics     REVIEW OF SYSTEMS:   RESPIRATORY: No cough, shortness of breath, wheezing.  CARDIOVASCULAR: No chest pain, orthopnea, edema.  HEMATOLOGY: No anemia, easy bruising or bleeding SKIN: No rash or lesion. NEUROLOGIC: No tingling, numbness, weakness.  PSYCHIATRY: No anxiety or depression.   MEDICATIONS AT HOME:  Prior to Admission medications   Medication Sig Start Date End Date Taking? Authorizing Provider  Prenatal Vit-Fe Fumarate-FA (MULTIVITAMIN-PRENATAL) 27-0.8 MG TABS tablet Take 1 tablet by mouth daily at 12 noon.    [provider]      PHYSICAL EXAMINATION:   VITAL SIGNS: Last menstrual period 01/27/2018.  GENERAL:  28 y.o.-year-old patient no acute distress.  HEENT: Head atraumatic, normocephalic. Oropharynx and nasopharynx clear. MP 2, TM distance >3 cm, normal mouth  opening. LUNGS: Normal breath sounds bilaterally, no wheezing, rales,rhonchi. No use of accessory muscles of respiration.  CARDIOVASCULAR: S1, S2 normal. No murmurs, rubs, or gallops.  EXTREMITIES: No pedal edema, cyanosis, or clubbing.  NEUROLOGIC: normal gait PSYCHIATRIC: The patient is alert and oriented x 3.  SKIN: No obvious rash, lesion, or ulcer.    IMPRESSION AND PLAN:   Claudia Byrd  is a 28 y.o. female presenting with obesity during pregnancy. BMI is currently 44 at [redacted] weeks gestation.   Airway exam reassuring. Midline palpable.    We discussed analgesic options during labor including epidural analgesia. Discussed that in obesity there can be increased difficulty with epidural placement or even failure of successful epidural. We also discussed that even after successful epidural placement there is increased risk of catheter migration out of the epidural space that would require catheter replacement. Discussed use of epidural vs spinal vs GA if cesarean delivery is required. Discussed increased risk of difficult intubation during pregnancy should an emergency cesarean delivery be required.   Plan for delivery at [facility].

## 2018-10-11 NOTE — Progress Notes (Signed)
Routine Prenatal Care Visit  Subjective  Claudia Byrd is a 28 y.o. G1P0000 at [redacted]w[redacted]d being seen today for ongoing prenatal care.  She is currently monitored for the following issues for this high-risk pregnancy and has Supervision of high risk pregnancy, antepartum; Chronic hypertension during pregnancy, antepartum; and Maternal obesity, antepartum on their problem list.  ----------------------------------------------------------------------------------- Patient reports no complaints.   Contractions: Not present. Vag. Bleeding: None.  Movement: Present. Denies leaking of fluid.  ----------------------------------------------------------------------------------- The following portions of the patient's history were reviewed and updated as appropriate: allergies, current medications, past family history, past medical history, past social history, past surgical history and problem list. Problem list updated.   Objective  Last menstrual period 01/27/2018. Blood pressure 122/66, weight (!) 305 lb (138.3 kg), last menstrual period 01/27/2018. Pregravid weight 260 lb (117.9 kg) Total Weight Gain 45 lb (20.4 kg)  Body mass index is 45.04 kg/m.  Urinalysis:      Fetal Status: Fetal Heart Rate (bpm): 144   Movement: Present  Presentation: Vertex  General:  Alert, oriented and cooperative. Patient is in no acute distress.  Skin: Skin is warm and dry. No rash noted.   Cardiovascular: Normal heart rate noted  Respiratory: Normal respiratory effort, no problems with respiration noted  Abdomen: Soft, gravid, appropriate for gestational age. Pain/Pressure: Absent     Pelvic:  Cervical exam deferred Dilation: Closed      Extremities: Normal range of motion.     ental Status: Normal mood and affect. Normal behavior. Normal judgment and thought content.   US Ob Follow Up  Result Date: 10/11/2018 Patient Name: Claudia Byrd DOB: 11-08-1990 MRN: [id number] ULTRASOUND REPORT Location: [facility] Date  of Service: 10/11/2018 Indications:growth/afi Findings: [name] intrauterine pregnancy is visualized with FHR at 141 BPM. Biometrics give an (U/S) Gestational age of 2ww4d and an (U/S) EDD of 10/28/2018; this correlates with the clinically established Estimated Date of Delivery: 11/03/18. Fetal presentation is Cephalic. Placenta: anterior. Grade: 2 AFI: 10.89 cm Growth percentile is 64.6%ile. (BPD/HC measuring 2 weeks ahead, AC measuring 1 week ahead). EFW: 3186 g ( 7 lbs 0 oz) (document both grams and pounds) Impression: 1. [redacted]w[redacted]d Viable Singleton Intrauterine pregnancy previously established criteria. 2. Growth is 64.6 %ile.  AFI is 10.89 cm. Recommendations: 1.Clinical correlation with the patient's History and Physical Exam. [name], RDMS There is a singleton gestation with normal amniotic fluid volume. The fetal biometry correlates with established dating, normal composite growth.  Limited fetal anatomy was performed.The visualized fetal anatomical survey appears within normal limits within the resolution of ultrasound as described above.  It must be noted that a normal ultrasound is unable to rule out fetal aneuploidy.  [name], MD, [facility], [facility] 10/11/2018, 3:50 PM     Assessment   28 y.o. G1P0000 at [redacted]w[redacted]d by  11/03/2018, by Last Menstrual Period presenting for routine prenatal visit  Plan   Pregnancy #1 Problems (from 01/27/18 to present)    Problem Noted Resolved   Supervision of high risk pregnancy, antepartum 03/10/2018 by [name], MD No   Overview Addendum 08/31/2018  5:15 PM by Homero Fellers, MD    Clinic [facility] Prenatal Labs  Dating LMP = 7 week [place] Blood type: O/Positive/-- (09/18 1153)   Genetic Screen NIPS:Normal XX Inheritest: SMA, CF, Fragile-X negative Antibody:Negative (09/18 1153)  Anatomic [place] Incomplete 4-chamber and outflow tracts [x]  Complete 24 weeks Rubella: 5.81 (09/18 1153)  Varicella: Immune  GTT  Early:111  Third trimester: 102 RPR: Non Reactive (09/18 1153)   Rhogam N/A HBsAg: Negative (09/18 1153)   TDaP vaccine   08/31/2018 Flu Shot: 03/19/2018 HIV: Non Reactive (09/18 1153)   Baby Food  Breast                               GBS:   Contraception  POP Pap: 03/10/18 NIL  CBB     CS/VBAC N/A  [ ]  anesthesia referral  Support Person Husband [name]           Chronic hypertension during pregnancy, antepartum 03/10/2018 by [name], MD No   Overview Addendum 04/16/2018  9:30 AM by [name], MD    [X]  Aspirin 81 mg daily after 12 weeks; discontinue after 36 weeks [X]  baseline labs with CBC, CMP, urine protein/creatinine ratio [ ]  no BP meds unless BPs become elevated [ ]  ultrasound for growth at 28, 32, 36 weeks  Current antihypertensives:  None   Baseline and surveillance labs (pulled in from [facility], refresh links as needed)  Lab Results  Component Value Date   PLT 379 03/10/2018   CREATININE 0.70 03/10/2018   AST 18 03/10/2018   ALT 10 03/10/2018    Antenatal Testing CHTN - O10.919  Group I  BP < 140/90, no preeclampsia, AGA,  nml AFV, +/- meds    Group II BP > 140/90, on meds, no preeclampsia, AGA, nml AFV  20-28-34-38  20-24-28-32-35-38  32//2 x wk  28//BPP wkly then 32//2 x wk  40 no meds; 39 meds  PRN or 37  Pre-eclampsia  GHTN - O13.9/Preeclampsia without severe features  - O14.00   Preeclampsia with severe features - O14.10  Q 3-4wks  Q 2 wks  28//BPP wkly then 32//2 x wk  Inpatient  37  PRN or 34        Maternal obesity, antepartum 03/10/2018 by [name], MD No       Gestational age appropriate obstetric precautions including but not limited to vaginal bleeding, contractions, leaking of fluid and fetal movement were reviewed in detail with the patient.      Return in about 1 week (around 10/18/2018) for ROB and NST/AFI.  [name], MD, [facility], [facility] 10/11/2018, 4:12 PM

## 2018-10-11 NOTE — Progress Notes (Signed)
ROB Growth scan GBS

## 2018-10-15 LAB — STREP GP B CULTURE+RFLX: Strep Gp B Culture+Rflx: NEGATIVE

## 2018-10-18 ENCOUNTER — Other Ambulatory Visit: Payer: Self-pay

## 2018-10-18 ENCOUNTER — Ambulatory Visit (INDEPENDENT_AMBULATORY_CARE_PROVIDER_SITE_OTHER): Payer: BC Managed Care – PPO | Admitting: Obstetrics and Gynecology

## 2018-10-18 ENCOUNTER — Ambulatory Visit (INDEPENDENT_AMBULATORY_CARE_PROVIDER_SITE_OTHER): Payer: BC Managed Care – PPO

## 2018-10-18 VITALS — BP 106/68 | Wt 302.0 lb

## 2018-10-18 DIAGNOSIS — O99210 Obesity complicating pregnancy, unspecified trimester: Secondary | ICD-10-CM

## 2018-10-18 DIAGNOSIS — Z362 Encounter for other antenatal screening follow-up: Secondary | ICD-10-CM | POA: Diagnosis not present

## 2018-10-18 DIAGNOSIS — O10919 Unspecified pre-existing hypertension complicating pregnancy, unspecified trimester: Secondary | ICD-10-CM

## 2018-10-18 DIAGNOSIS — O10913 Unspecified pre-existing hypertension complicating pregnancy, third trimester: Secondary | ICD-10-CM

## 2018-10-18 DIAGNOSIS — O99213 Obesity complicating pregnancy, third trimester: Secondary | ICD-10-CM

## 2018-10-18 DIAGNOSIS — O0990 Supervision of high risk pregnancy, unspecified, unspecified trimester: Secondary | ICD-10-CM

## 2018-10-18 DIAGNOSIS — Z3A37 37 weeks gestation of pregnancy: Secondary | ICD-10-CM

## 2018-10-18 LAB — POCT URINALYSIS DIPSTICK OB
Glucose, UA: NEGATIVE
POC,PROTEIN,UA: NEGATIVE

## 2018-10-18 LAB — FETAL NONSTRESS TEST

## 2018-10-18 NOTE — Progress Notes (Signed)
Routine Prenatal Care Visit  Subjective  Claudia Byrd is a 28 y.o. G1P0000 at [redacted]w[redacted]d being seen today for ongoing prenatal care.  She is currently monitored for the following issues for this high-risk pregnancy and has Supervision of high risk pregnancy, antepartum; Chronic hypertension during pregnancy, antepartum; and Maternal obesity, antepartum on their problem list.  ----------------------------------------------------------------------------------- Patient reports no complaints.   Contractions: Irregular. Vag. Bleeding: None.  Movement: Present. Denies leaking of fluid.  ----------------------------------------------------------------------------------- The following portions of the patient's history were reviewed and updated as appropriate: allergies, current medications, past family history, past medical history, past social history, past surgical history and problem list. Problem list updated.   Objective  Blood pressure 106/68, weight (!) 302 lb (137 kg), last menstrual period 01/27/2018. Pregravid weight 260 lb (117.9 kg) Total Weight Gain 42 lb (19.1 kg) Urinalysis:      Fetal Status: Fetal Heart Rate (bpm): 150   Movement: Present  Presentation: Vertex  General:  Alert, oriented and cooperative. Patient is in no acute distress.  Skin: Skin is warm and dry. No rash noted.   Cardiovascular: Normal heart rate noted  Respiratory: Normal respiratory effort, no problems with respiration noted  Abdomen: Soft, gravid, appropriate for gestational age. Pain/Pressure: Absent     Pelvic:  Cervical exam deferred        Extremities: Normal range of motion.     ental Status: Normal mood and affect. Normal behavior. Normal judgment and thought content.   US Ob Follow Up  Result Date: 10/18/2018 Patient Name: Claudia Byrd DOB: May 14, 1991 MRN: [id number] ULTRASOUND REPORT Location: [facility] Date of Service: 10/18/2018 Indications:growth/afi Findings: [name] intrauterine pregnancy is  visualized with FHR at 144 BPM. Biometrics give an (U/S) Gestational age of [redacted]w[redacted]d and an (U/S) EDD of 10/27/2018; this correlates with the clinically established Estimated Date of Delivery: 11/03/18. Fetal presentation is Cephalic. Placenta: anterior. Grade: 3 AFI: 12.21 cm Growth percentile is 71.2. EFW: 3458 g(7 lbs 10 oz) BPP  Still two weeks ahead. AC Still one week ahead. Impression: 1. [redacted]w[redacted]d Viable Singleton Intrauterine pregnancy previously established criteria. 2. Growth is 71.2 %ile.  AFI is 12.21 cm. Recommendations: 1.Clinical correlation with the patient's History and Physical Exam. [name], RDMS There is a singleton gestation with normal amniotic fluid volume. The fetal biometry correlates with established dating.  Limited fetal anatomy was performed.The visualized fetal anatomical survey appears within normal limits within the resolution of ultrasound as described above.  It must be noted that a normal ultrasound is unable to rule out fetal aneuploidy.  [name], MD, [facility], [facility] 10/18/2018, 3:50 PM   US Ob Follow Up  Result Date: 10/11/2018 Patient Name: Claudia Byrd DOB: 1991/04/26 MRN: [id number] ULTRASOUND REPORT Location: [facility] Date of Service: 10/11/2018 Indications:growth/afi Findings: [name] intrauterine pregnancy is visualized with FHR at 141 BPM. Biometrics give an (U/S) Gestational age of 39ww4d and an (U/S) EDD of 10/28/2018; this correlates with the clinically established Estimated Date of Delivery: 11/03/18. Fetal presentation is Cephalic. Placenta: anterior. Grade: 2 AFI: 10.89 cm Growth percentile is 64.6%ile. (BPD/HC measuring 2 weeks ahead, AC measuring 1 week ahead). EFW: 3186 g ( 7 lbs 0 oz) (document both grams and pounds) Impression: 1. [redacted]w[redacted]d Viable Singleton Intrauterine pregnancy previously established criteria. 2. Growth is 64.6 %ile.  AFI is 10.89 cm. Recommendations: 1.Clinical correlation with the patient's History  and Physical Exam. [name], RDMS There is a singleton gestation with normal amniotic fluid volume.  The fetal biometry correlates with established dating, normal composite growth.  Limited fetal anatomy was performed.The visualized fetal anatomical survey appears within normal limits within the resolution of ultrasound as described above.  It must be noted that a normal ultrasound is unable to rule out fetal aneuploidy.  [name], MD, [facility], [facility] 10/11/2018, 3:50 PM   Baseline: 150 Variability: moderate Accelerations: present Decelerations: absent Tocometry: N/A The patient was monitored for 30 minutes, fetal heart rate tracing was deemed reactive, category I tracing,  CPT [phone]   Assessment   28 y.o. G1P0000 at [redacted]w[redacted]d by  11/03/2018, by Last Menstrual Period presenting for routine prenatal visit  Plan   Pregnancy #1 Problems (from 01/27/18 to present)    Problem Noted Resolved   Supervision of high risk pregnancy, antepartum 03/10/2018 by [name], MD No   Overview Addendum 08/31/2018  5:15 PM by Homero Fellers, MD    Clinic [facility] Prenatal Labs  Dating LMP = 7 week [place] Blood type: O/Positive/-- (09/18 1153)   Genetic Screen NIPS:Normal XX Inheritest: SMA, CF, Fragile-X negative Antibody:Negative (09/18 1153)  Anatomic [place] Incomplete 4-chamber and outflow tracts [x]  Complete 24 weeks Rubella: 5.81 (09/18 1153)  Varicella: Immune  GTT Early:111    Third trimester: 102 RPR: Non Reactive (09/18 1153)   Rhogam N/A HBsAg: Negative (09/18 1153)   TDaP vaccine   08/31/2018 Flu Shot: 03/19/2018 HIV: Non Reactive (09/18 1153)   Baby Food  Breast                               GBS:   Contraception  POP Pap: 03/10/18 NIL  CBB     CS/VBAC N/A  [ ]  anesthesia referral  Support Person Husband [name]           Chronic hypertension during pregnancy, antepartum 03/10/2018 by [name], MD No   Overview Addendum 04/16/2018   9:30 AM by [name], MD    [X]  Aspirin 81 mg daily after 12 weeks; discontinue after 36 weeks [X]  baseline labs with CBC, CMP, urine protein/creatinine ratio [ ]  no BP meds unless BPs become elevated [ ]  ultrasound for growth at 28, 32, 36 weeks  Current antihypertensives:  None   Baseline and surveillance labs (pulled in from [facility], refresh links as needed)  Lab Results  Component Value Date   PLT 379 03/10/2018   CREATININE 0.70 03/10/2018   AST 18 03/10/2018   ALT 10 03/10/2018    Antenatal Testing CHTN - O10.919  Group I  BP < 140/90, no preeclampsia, AGA,  nml AFV, +/- meds    Group II BP > 140/90, on meds, no preeclampsia, AGA, nml AFV  20-28-34-38  20-24-28-32-35-38  32//2 x wk  28//BPP wkly then 32//2 x wk  40 no meds; 39 meds  PRN or 37  Pre-eclampsia  GHTN - O13.9/Preeclampsia without severe features  - O14.00   Preeclampsia with severe features - O14.10  Q 3-4wks  Q 2 wks  28//BPP wkly then 32//2 x wk  Inpatient  37  PRN or 34        Maternal obesity, antepartum 03/10/2018 by [name], MD No       Gestational age appropriate obstetric precautions including but not limited to vaginal bleeding, contractions, leaking of fluid and fetal movement were reviewed in detail with the patient.    Return in about 1 week (around 10/25/2018) for [place],  NST, AFI.  [name], MD, [facility], [facility] 10/18/2018, 4:06 PM

## 2018-10-18 NOTE — Progress Notes (Signed)
ROB AFI/NST 

## 2018-10-26 ENCOUNTER — Ambulatory Visit (INDEPENDENT_AMBULATORY_CARE_PROVIDER_SITE_OTHER): Payer: BC Managed Care – PPO | Admitting: Obstetrics and Gynecology

## 2018-10-26 ENCOUNTER — Ambulatory Visit
Admission: RE | Admit: 2018-10-26 | Discharge: 2018-10-26 | Disposition: A | Discharge status: Home / Self Care | Payer: BC Managed Care – PPO | Source: Ambulatory Visit | Attending: Obstetrics and Gynecology | Admitting: Obstetrics and Gynecology

## 2018-10-26 ENCOUNTER — Other Ambulatory Visit: Payer: Self-pay

## 2018-10-26 VITALS — BP 124/72 | Wt 304.0 lb

## 2018-10-26 DIAGNOSIS — Z3A38 38 weeks gestation of pregnancy: Secondary | ICD-10-CM

## 2018-10-26 DIAGNOSIS — O10919 Unspecified pre-existing hypertension complicating pregnancy, unspecified trimester: Secondary | ICD-10-CM | POA: Diagnosis present

## 2018-10-26 DIAGNOSIS — O0990 Supervision of high risk pregnancy, unspecified, unspecified trimester: Secondary | ICD-10-CM | POA: Diagnosis present

## 2018-10-26 DIAGNOSIS — O99210 Obesity complicating pregnancy, unspecified trimester: Secondary | ICD-10-CM

## 2018-10-26 DIAGNOSIS — O99213 Obesity complicating pregnancy, third trimester: Secondary | ICD-10-CM

## 2018-10-26 DIAGNOSIS — O10913 Unspecified pre-existing hypertension complicating pregnancy, third trimester: Secondary | ICD-10-CM | POA: Diagnosis not present

## 2018-10-26 LAB — FETAL NONSTRESS TEST

## 2018-10-26 LAB — POCT URINALYSIS DIPSTICK OB
Glucose, UA: NEGATIVE
POC,PROTEIN,UA: NEGATIVE

## 2018-10-26 NOTE — Progress Notes (Signed)
Obstetric H&P   Chief Complaint: [place]  Prenatal Care Provider: [facility]  History of Present Illness: 28 y.o. G1P0000 63w6dby 11/03/2018, by Last Menstrual Period presenting today for routine prenatal visit and NST for CHTN unmedicated this pregnancy and running normotensive throughout pregnancy.  +FM, no LOF, no VB, no ctx.   Pregravid weight 260 lb (117.9 kg) Total Weight Gain 44 lb (20 kg)  Body mass index is 44.89 kg/m.   Pregnancy #1 Problems (from 01/27/18 to present)    Problem Noted Resolved   Supervision of high risk pregnancy, antepartum 03/10/2018 by SMalachy Mood MD No   Overview Addendum 08/31/2018  5:15 PM by SHomero Fellers MD    [facility] Prenatal Labs  Dating LMP = 7 week UKoreaBlood type: O/Positive/-- (09/18 1153)   Genetic Screen NIPS:Normal XX Inheritest: SMA, CF, Fragile-X negative Antibody:Negative (09/18 1153)  Anatomic UKoreaIncomplete 4-chamber and outflow tracts [x]  Complete 24 weeks Rubella: 5.81 (09/18 1153)  Varicella: Immune  GTT Early:111    Third trimester: 102 RPR: Non Reactive (09/18 1153)   Rhogam N/A HBsAg: Negative (09/18 1153)   TDaP vaccine   08/31/2018 Flu Shot: 03/19/2018 HIV: Non Reactive (09/18 1153)   Baby Food  Breast                               GBS:   Contraception  POP Pap: 03/10/18 NIL  CBB     CS/VBAC N/A  [ ]  anesthesia referral  Support Person Husband STheresia Lo          Chronic hypertension during pregnancy, antepartum 03/10/2018 by SMalachy Mood MD No   Overview Addendum 04/16/2018  9:30 AM by SMalachy Mood MD    [X]  Aspirin 81 mg daily after 12 weeks; discontinue after 36 weeks [X]  baseline labs with CBC, CMP, urine protein/creatinine ratio [X]  no BP meds unless BPs become elevated [X]  ultrasound for growth at 28, 32, 36 weeks - 10/18/2018 315w5dlbs 10oz (3458g) c/w 71.2%ile - 10/11/2018 3776w4dbs 0oz (3186g) c/w 64.6%ile - 08/31/2018 30w33w6ds 1oz (1,840g) c/w 62.6%ile  Current antihypertensives:  None    Baseline and surveillance labs (pulled in from [facility] links as needed)  Lab Results  Component Value Date   PLT 379 03/10/2018   CREATININE 0.70 03/10/2018   AST 18 03/10/2018   ALT 10 03/10/2018    Antenatal Testing CHTN - O10.919  Group I  BP < 140/90, no preeclampsia, AGA,  nml AFV, +/- meds    Group II BP > 140/90, on meds, no preeclampsia, AGA, nml AFV  20-28-34-38  20-24-28-32-35-38  32//2 x wk  28//BPP wkly then 32//2 x wk  40 no meds; 39 meds  PRN or 37  Pre-eclampsia  GHTN - O13.9/Preeclampsia without severe features  - O14.00   Preeclampsia with severe features - O14.10  Q 3-4wks  Q 2 wks  28//BPP wkly then 32//2 x wk  Inpatient  37  PRN or 34        Maternal obesity, antepartum 03/10/2018 by StaeMalachy Mood No       Review of Systems: 10 point review of systems negative unless otherwise noted in HPI  Past Medical History: Past Medical History:  Diagnosis Date  . Hypertension     Past Surgical History: No past surgical history on file.  Past Obstetric History: #: 1, Date: None, Sex: None, Weight: None, GA: None, Delivery: None, Apgar1: None,  Apgar5: None, Living: None, Birth Comments: None   Past Gynecologic History:  Family History: No family history on file.  Social History: Social History   Socioeconomic History  . Marital status: Married    Spouse name: Not on file  . Number of children: Not on file  . Years of education: Not on file  . Highest education level: Not on file  Occupational History  . Not on file  Social Needs  . Financial resource strain: Not on file  . Food insecurity:    Worry: Not on file    Inability: Not on file  . Transportation needs:    Medical: Not on file    Non-medical: Not on file  Tobacco Use  . Smoking status: Never Smoker  . Smokeless tobacco: Never Used  Substance and Sexual Activity  . Alcohol use: Never    Frequency: Never  . Drug use: Never  . Sexual activity: Yes     Partners: Male    Birth control/protection: Pill  Lifestyle  . Physical activity:    Days per week: Not on file    Minutes per session: Not on file  . Stress: Not on file  Relationships  . Social connections:    Talks on phone: Not on file    Gets together: Not on file    Attends religious service: Not on file    Active member of club or organization: Not on file    Attends meetings of clubs or organizations: Not on file    Relationship status: Not on file  . Intimate partner violence:    Fear of current or ex partner: Not on file    Emotionally abused: Not on file    Physically abused: Not on file    Forced sexual activity: Not on file  Other Topics Concern  . Not on file  Social History Narrative  . Not on file    Medications: Prior to Admission medications   Medication Sig Start Date End Date Taking? Authorizing Provider  Prenatal Vit-Fe Fumarate-FA (MULTIVITAMIN-PRENATAL) 27-0.8 MG TABS tablet Take 1 tablet by mouth daily at 12 noon.    [provider]    Allergies: Allergies  Allergen Reactions  . Penicillins Itching  . Sulfa Antibiotics     Physical Exam: Vitals: Blood pressure 124/72, weight (!) 304 lb (137.9 kg), last menstrual period 01/27/2018.  Urine Dip Protein: negative  Baseline: 150 Variability: moderate Accelerations: present Decelerations: absent Tocometry: N/A The patient was monitored for 30 minutes, fetal heart rate tracing was deemed reactive, category I tracing,    General: NAD HEENT: normocephalic, anicteric Pulmonary: No increased work of breathing Cardiovascular: RRR, distal pulses 2+ Abdomen: Gravid, non-tender Leopolds: vtx Genitourinary: 0/30/-3 Extremities: no edema, erythema, or tenderness Neurologic: Grossly intact Psychiatric: mood appropriate, affect full  Labs: Results for orders placed or performed in visit on 10/26/18 (from the past 24 hour(s))  POC Urinalysis Dipstick OB     Status: Normal   Collection  Time: 10/26/18  3:52 PM  Result Value Ref Range   Color, UA     Clarity, UA     Glucose, UA Negative Negative   Bilirubin, UA     Ketones, UA     Spec Grav, UA     Blood, UA     pH, UA     POC,PROTEIN,UA Negative Negative, Trace, Small (1+), Moderate (2+), Large (3+), 4+   Urobilinogen, UA     Nitrite, UA     Leukocytes, UA  Appearance     Odor     US Ob Limited  Result Date: 10/26/2018 CLINICAL DATA:  High-risk pregnancy with maternal chronic hypertension and obesity. Evaluate amniotic fluid volume. EXAM: LIMITED OBSTETRIC ULTRASOUND FINDINGS: Number of Fetuses: 1 Heart Rate:  161 bpm Movement: Yes Presentation: Cephalic Previa: No Placental Location: Anterior Amniotic Fluid (Subjective): Within normal limits AFI 12.0 cm BPD:  10.1cm 41w 3d Maternal Findings: Cervix:  3.7 cm TA IMPRESSION: Single living intrauterine fetus in cephalic presentation. Amniotic fluid volume within limits, AFI of 12.0 cm. Electronically Signed   By: Earle Gell M.D.   On: 10/26/2018 16:23     Assessment: 28 y.o. G1P0000 73w6dby 11/03/2018, by Last Menstrual Period ROB and NST today  Plan: 1) CHTN - unmedicated, normotensive throughout pregnancy.  Briefly on labetalol prior to conception with newly diagnosed essential HTN by PCP.  Given CHTN, BMI>40, and ARRIVE study findings recommended induction of labor prior [redacted] weeks gestation.  IOL scheduled for 10/31/2018  2) Fetus - reactive category I tracing  3) PNL - Blood type O/Positive/-- (09/18 1153) / Anti-bodyscreen Negative (09/18 1153) / Rubella 5.81 (09/18 1153) / Varicella Immune / RPR Non Reactive (02/17 0932) / HBsAg Negative (09/18 1153) / HIV Non Reactive (02/17 0932) / 1-hr OGTT 102 / GBS    4) Immunization History -  Immunization History  Administered Date(s) Administered  . DTaP 11/13/1994  . Influenza Split 04/19/2014  . MMR 11/13/1994, 10/26/2017  . OPV 11/13/1994  . PPD Test 09/30/2011, 10/23/2017  . Pneumococcal Conjugate-13  04/18/1999  . Tdap 10/29/2011, 06/13/2016, 08/31/2018    5) Disposition - pending delivery  AMalachy Mood MD, [facility] 10/26/2018, 4:42 PM

## 2018-10-26 NOTE — Progress Notes (Signed)
ROB AFI (outside facility) NST

## 2018-10-26 NOTE — Progress Notes (Signed)
  [facility] INDUCTION ASSESSMENT SCHEDULING Claudia Byrd [birth year] Medical record #: [id number] Phone #:  Home Phone [phone]  Mobile [phone]    Prenatal Provider:[facility] Proposed admission date/time:10/31/2018 0800 Method of induction:Cytotec  Weight: Filed Weights05/05/20 1549Weight:(!) 304 lb (137.9 kg) BMI Body mass index is 44.89 kg/m. HIV Negative HSV Negative EDC Estimated Date of Delivery: 5/13/20based on:LMP  Gestational age on admission: [redacted]w[redacted]d Gravidity/parity:G1P0000  Cervix Score   0 1 2 3   Position Posterior Midposition Anterior   Consistency Firm Medium Soft   Effacement (%) 0-30 40-50 60-70 >80  Dilation (cm) Closed 1-2 3-4 >5  Baby's station -3 -2 -1 +1, +2   Bishop Score:1   Medical induction of labor  select indication(s) below Elective induction ?39 weeks multiparous patient ?39 weeks primiparous patient with Bishop score ?7 ?40 weeks primiparous patient   Medical Indications Adapted from [place] #[id number], "Medically Indicated Late Preterm and Early Term Deliveries," 2013.  PLACENTAL / UTERINE ISSUES FETAL ISSUES MATERNAL ISSUES  ? Placenta previa (36.0-37.6) ? Isoimmunization (37.0-38.6) ? Preeclampsia without severe features or gestational HTN (37.0)  ? Suspected accreta (34.0-35.6) ? Growth Restriction [name]) ? Preeclampsia with severe features (34.0)  ? Prior classical CD, uterine window, rupture (36.0-37.6) ? Isolated (38.0-39.6) ? Chronic HTN (38.0-39.6)  ? Prior myomectomy (37.0-38.6) ? Concurrent findings (34.0-37.6) ? Cholestasis (37.0)  ? 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 Growth Restriction (Twins) ? Diabetes  ? Placental abruption (chronic) ? Di-Di Isolated (36.0-37.6) ? Pregestational, controlled (39.0)  OBSTETRIC ISSUES ? Di-Di concurrent findings (32.0-34.6) ? Pregestational, uncontrolled (37.0-39.0)  ? Postdates ? (41 weeks) ? Mo-Di isolated (32.0-34.6) ? Pregestational,  vascular compromise (37.0- 39.0)  ? PPROM (34.0) ? Multiple Gestation ? Gestational, diet controlled (40.0)  ? Hx of IUFD (39.0 weeks) ? Di-Di (38.0-38.6) ? Gestational, med controlled (39.0)  ? Polyhydramnios, mild/moderate; SDV 8-16 or AFI 25-35 (39.0) ? Mo-Di (36.0-37.6) ? Gestational, uncontrolled (38.0-39.0)  ? Oligohydramnios (36.0-37.6); MVP <2 cm  For indications not listed above, delivery recommendations from maternal-fetal medicine consultant occurred on: Date:N/A  Provider Signature: [name] Scheduled by:AMS Date:10/26/2018 4:54 PM   Call [phone] to finalize the induction date/time  [id number] (07/17)

## 2018-10-26 NOTE — Addendum Note (Signed)
Addended by: Dorthula Nettles on: 10/26/2018 05:13 PM   Modules accepted: Orders, SmartSet

## 2018-10-31 ENCOUNTER — Other Ambulatory Visit: Payer: Self-pay

## 2018-10-31 ENCOUNTER — Inpatient Hospital Stay
Admission: RE | Admit: 2018-10-31 | Discharge: 2018-11-03 | DRG: 768 | Disposition: A | Discharge status: Home / Self Care | Payer: BC Managed Care – PPO | Attending: Advanced Practice Midwife | Admitting: Advanced Practice Midwife

## 2018-10-31 DIAGNOSIS — O9081 Anemia of the puerperium: Secondary | ICD-10-CM | POA: Diagnosis not present

## 2018-10-31 DIAGNOSIS — O411230 Chorioamnionitis, third trimester, not applicable or unspecified: Secondary | ICD-10-CM | POA: Diagnosis present

## 2018-10-31 DIAGNOSIS — O10919 Unspecified pre-existing hypertension complicating pregnancy, unspecified trimester: Secondary | ICD-10-CM

## 2018-10-31 DIAGNOSIS — O99213 Obesity complicating pregnancy, third trimester: Secondary | ICD-10-CM

## 2018-10-31 DIAGNOSIS — Z3490 Encounter for supervision of normal pregnancy, unspecified, unspecified trimester: Secondary | ICD-10-CM | POA: Diagnosis present

## 2018-10-31 DIAGNOSIS — O99214 Obesity complicating childbirth: Secondary | ICD-10-CM | POA: Diagnosis present

## 2018-10-31 DIAGNOSIS — O0990 Supervision of high risk pregnancy, unspecified, unspecified trimester: Secondary | ICD-10-CM

## 2018-10-31 DIAGNOSIS — O1002 Pre-existing essential hypertension complicating childbirth: Principal | ICD-10-CM | POA: Diagnosis present

## 2018-10-31 DIAGNOSIS — D62 Acute posthemorrhagic anemia: Secondary | ICD-10-CM | POA: Diagnosis not present

## 2018-10-31 DIAGNOSIS — Z3A39 39 weeks gestation of pregnancy: Secondary | ICD-10-CM

## 2018-10-31 DIAGNOSIS — O1092 Unspecified pre-existing hypertension complicating childbirth: Secondary | ICD-10-CM

## 2018-10-31 LAB — TYPE AND SCREEN
ABO/RH(D): O POS
Antibody Screen: NEGATIVE

## 2018-10-31 LAB — CBC
HCT: 37 % (ref 36.0–46.0)
Hemoglobin: 12.2 g/dL (ref 12.0–15.0)
MCH: 26.3 pg (ref 26.0–34.0)
MCHC: 33 g/dL (ref 30.0–36.0)
MCV: 79.9 fL — ABNORMAL LOW (ref 80.0–100.0)
Platelets: 289 10*3/uL (ref 150–400)
RBC: 4.63 MIL/uL (ref 3.87–5.11)
RDW: 12.9 % (ref 11.5–15.5)
WBC: 11.6 10*3/uL — ABNORMAL HIGH (ref 4.0–10.5)
nRBC: 0 % (ref 0.0–0.2)

## 2018-10-31 MED ORDER — OXYTOCIN BOLUS FROM INFUSION
500.0000 mL | Freq: Once | INTRAVENOUS | Status: AC
  Administered 2018-11-01: 23:00:00 500 mL via INTRAVENOUS

## 2018-10-31 MED ORDER — ONDANSETRON HCL 4 MG/2ML IJ SOLN: 4.0000 mg | Freq: Four times a day (QID) | INTRAMUSCULAR | Status: DC | PRN

## 2018-10-31 MED ORDER — LACTATED RINGERS IV SOLN: 500.0000 mL | INTRAVENOUS | Status: DC | PRN

## 2018-10-31 MED ORDER — TERBUTALINE SULFATE 1 MG/ML IJ SOLN: 0.2500 mg | Freq: Once | INTRAMUSCULAR | Status: DC | PRN

## 2018-10-31 MED ORDER — SOD CITRATE-CITRIC ACID 500-334 MG/5ML PO SOLN: 30.0000 mL | ORAL | Status: DC | PRN

## 2018-10-31 MED ORDER — OXYTOCIN 40 UNITS IN NORMAL SALINE INFUSION - SIMPLE MED
2.5000 [IU]/h | INTRAVENOUS | Status: DC
  Administered 2018-11-01: 2.5 [IU]/h via INTRAVENOUS
  Filled 2018-10-31 (×2): qty 1000

## 2018-10-31 MED ORDER — ACETAMINOPHEN 325 MG PO TABS
650.0000 mg | ORAL_TABLET | ORAL | Status: DC | PRN
  Administered 2018-11-01: 650 mg via ORAL
  Filled 2018-10-31: qty 2

## 2018-10-31 MED ORDER — MISOPROSTOL 200 MCG PO TABS
ORAL_TABLET | ORAL | Status: AC
  Administered 2018-10-31: 25 ug via VAGINAL
  Filled 2018-10-31: qty 4

## 2018-10-31 MED ORDER — MISOPROSTOL 25 MCG QUARTER TABLET
25.0000 ug | ORAL_TABLET | ORAL | Status: DC | PRN
  Administered 2018-10-31 (×4): 25 ug via VAGINAL
  Filled 2018-10-31 (×5): qty 1

## 2018-10-31 MED ORDER — SODIUM CHLORIDE FLUSH 0.9 % IV SOLN
INTRAVENOUS | Status: AC
  Filled 2018-10-31: qty 10

## 2018-10-31 MED ORDER — LACTATED RINGERS IV SOLN
INTRAVENOUS | Status: DC
  Administered 2018-10-31 – 2018-11-01 (×5): via INTRAVENOUS

## 2018-10-31 MED ORDER — LIDOCAINE HCL (PF) 1 % IJ SOLN
30.0000 mL | INTRAMUSCULAR | Status: AC | PRN
  Administered 2018-11-01: 23:00:00 30 mL via SUBCUTANEOUS
  Filled 2018-10-31: qty 30

## 2018-10-31 NOTE — OB Triage Note (Signed)
Scheduled IOL for chronic HTN

## 2018-10-31 NOTE — Progress Notes (Signed)
   Subjective:  Rating contractions 4/10 slightly stronger  Objective:   Vitals: Blood pressure 123/74, pulse 80, temperature 98.3 F (36.8 C), temperature source Oral, resp. rate 18, height 5\' 9"  (1.753 m), weight (!) 137.9 kg, last menstrual period 01/27/2018. General: NAD Abdomen: gravid, non-tender Cervical Exam:  Dilation: 1 Effacement (%): 30, 40 Cervical Position: Posterior Station: -3 Presentation: Vertex Exam by:: Lorin Hauck  FHT: 155, moderate, +accels, no decels Toco: q4-85min  Results for orders placed or performed during the hospital encounter of 10/31/18 (from the past 24 hour(s))  CBC     Status: Abnormal   Collection Time: 10/31/18  9:11 AM  Result Value Ref Range   WBC 11.6 (H) 4.0 - 10.5 K/uL   RBC 4.63 3.87 - 5.11 MIL/uL   Hemoglobin 12.2 12.0 - 15.0 g/dL   HCT 37.0 36.0 - 46.0 %   MCV 79.9 (L) 80.0 - 100.0 fL   MCH 26.3 26.0 - 34.0 pg   MCHC 33.0 30.0 - 36.0 g/dL   RDW 12.9 11.5 - 15.5 %   Platelets 289 150 - 400 K/uL   nRBC 0.0 0.0 - 0.2 %  Type and screen     Status: None   Collection Time: 10/31/18  9:11 AM  Result Value Ref Range   ABO/RH(D) O POS    Antibody Screen NEG    Sample Expiration      11/03/2018,2359 Performed at [facility], [address] [place], [place] [address]     Assessment:   28 y.o. G1P0000 [redacted]w[redacted]d IOl CHTN  Plan:   1) Labor - 4th dose of cytotec placed. Good cervical change but still very posterior and not amenable to foley placement  2) Fetus - cat I tracing  3) CHTN - normotensive   [name], MD, [facility], [facility] 10/31/2018, 10:43 PM

## 2018-10-31 NOTE — Progress Notes (Signed)
   Subjective:  Starting to feel some incerw  Objective:   Vitals: Blood pressure 115/64, pulse 80, temperature 97.9 F (36.6 C), temperature source Oral, resp. rate 18, height 5\' 9"  (1.753 m), weight (!) 137.9 kg, last menstrual period 01/27/2018. General: NAD Abdomen: gravid, non-tender Cervical Exam:  Dilation: Fingertip(dimple) Effacement (%): 30, 40 Cervical Position: Posterior Station: -3 Exam by:: Lester Crickenberger,MD  FHT: 150, moderate, +accles, no decels Toco: contraction q3-72min  Results for orders placed or performed during the hospital encounter of 10/31/18 (from the past 24 hour(s))  CBC     Status: Abnormal   Collection Time: 10/31/18  9:11 AM  Result Value Ref Range   WBC 11.6 (H) 4.0 - 10.5 K/uL   RBC 4.63 3.87 - 5.11 MIL/uL   Hemoglobin 12.2 12.0 - 15.0 g/dL   HCT 37.0 36.0 - 46.0 %   MCV 79.9 (L) 80.0 - 100.0 fL   MCH 26.3 26.0 - 34.0 pg   MCHC 33.0 30.0 - 36.0 g/dL   RDW 12.9 11.5 - 15.5 %   Platelets 289 150 - 400 K/uL   nRBC 0.0 0.0 - 0.2 %  Type and screen     Status: None   Collection Time: 10/31/18  9:11 AM  Result Value Ref Range   ABO/RH(D) O POS    Antibody Screen NEG    Sample Expiration      11/03/2018,2359 Performed at [facility], [address] [place], [place] [address]     Assessment:   28 y.o. G1P0000 [redacted]w[redacted]d IOL CHTN  Plan:   1) Labor - third dose of cytotec placed  2) Fetus - cat I tracing  3) CHTN - normotensive, unmedicated  [name], MD, [place], [facility] 10/31/2018, 6:45 PM

## 2018-10-31 NOTE — H&P (Signed)
Date of Initial H&P: 10/26/2018  History reviewed, patient examined, no change in status, stable for surgery.

## 2018-11-01 ENCOUNTER — Inpatient Hospital Stay: Payer: BC Managed Care – PPO | Admitting: Anesthesiology

## 2018-11-01 ENCOUNTER — Encounter: Payer: Self-pay | Admitting: Anesthesiology

## 2018-11-01 MED ORDER — BUPIVACAINE HCL (PF) 0.25 % IJ SOLN
INTRAMUSCULAR | Status: DC | PRN
  Administered 2018-11-01: 10 mL via EPIDURAL

## 2018-11-01 MED ORDER — PHENYLEPHRINE 40 MCG/ML (10ML) SYRINGE FOR IV PUSH (FOR BLOOD PRESSURE SUPPORT): 80.0000 ug | PREFILLED_SYRINGE | INTRAVENOUS | Status: DC | PRN

## 2018-11-01 MED ORDER — DIPHENHYDRAMINE HCL 50 MG/ML IJ SOLN: 12.5000 mg | INTRAMUSCULAR | Status: DC | PRN

## 2018-11-01 MED ORDER — EPHEDRINE 5 MG/ML INJ: 10.0000 mg | INTRAVENOUS | Status: DC | PRN

## 2018-11-01 MED ORDER — FENTANYL 2.5 MCG/ML W/ROPIVACAINE 0.15% IN NS 100 ML EPIDURAL (ARMC)
EPIDURAL | Status: AC
  Filled 2018-11-01: qty 100

## 2018-11-01 MED ORDER — SODIUM CHLORIDE 0.9 % IV SOLN
2.0000 g | Freq: Three times a day (TID) | INTRAVENOUS | Status: DC
  Administered 2018-11-01: 20:00:00 2 g via INTRAVENOUS
  Filled 2018-11-01: qty 2

## 2018-11-01 MED ORDER — LACTATED RINGERS IV SOLN: 500.0000 mL | Freq: Once | INTRAVENOUS | Status: DC

## 2018-11-01 MED ORDER — LIDOCAINE HCL (PF) 1 % IJ SOLN
INTRAMUSCULAR | Status: DC | PRN
  Administered 2018-11-01: 3 mL

## 2018-11-01 MED ORDER — FENTANYL 2.5 MCG/ML W/ROPIVACAINE 0.15% IN NS 100 ML EPIDURAL (ARMC)
12.0000 mL/h | EPIDURAL | Status: DC
  Administered 2018-11-01 (×3): 12 mL/h via EPIDURAL
  Filled 2018-11-01 (×3): qty 100

## 2018-11-01 MED ORDER — OXYTOCIN 40 UNITS IN NORMAL SALINE INFUSION - SIMPLE MED
1.0000 m[IU]/min | INTRAVENOUS | Status: DC
  Administered 2018-11-01: 03:00:00 2 m[IU]/min via INTRAVENOUS

## 2018-11-01 MED ORDER — LIDOCAINE-EPINEPHRINE (PF) 1.5 %-1:200000 IJ SOLN
INTRAMUSCULAR | Status: DC | PRN
  Administered 2018-11-01: 3 mL via PERINEURAL

## 2018-11-01 NOTE — Progress Notes (Signed)
  Labor Progress Note   28 y.o. G1P0000 @ [redacted]w[redacted]d , admitted for  Pregnancy, Labor Management. IOL Chronic Hypertension  Subjective:  Patient is comfortable with epidural in place.  Objective:  BP (!) 93/42   Pulse 64   Temp 98.5 F (36.9 C) (Oral)   Resp 15   Ht 5\' 9"  (1.753 m)   Wt (!) 137.9 kg   LMP 01/27/2018 (Exact Date)   SpO2 96%   BMI 44.89 kg/m  Abd: gravid, ND, FHT present, mild tenderness on exam Extr: trace to 1+ bilateral pedal edema SVE: CERVIX: 5 cm dilated, 70-80 effaced, -1 station  EFM: FHR: 145 bpm, variability: moderate,  accelerations:  Present,  decelerations:  Present some late variable decelerations seen Toco: Frequency: Every 1-5 minutes, MVUs 280 Labs: I have reviewed the patient's lab results.   Assessment & Plan:  G1P0000 @ [redacted]w[redacted]d, admitted for  Pregnancy and Labor/Delivery Management  1. Pain management: epidural. 2. FWB: FHT category II and overall reassuring.  3. ID: GBS negative 4. Labor management: pitocin  All discussed with patient, see orders   Rod Can, [facility] 11/01/2018  11:54 AM

## 2018-11-01 NOTE — Progress Notes (Signed)
  Labor Progress Note   28 y.o. G1P0000 @ [redacted]w[redacted]d , admitted for  Pregnancy, Labor Management. IOL  Subjective:  Patient feels some pressure with contractions  Objective:  BP (!) 102/44   Pulse 69   Temp 98.4 F (36.9 C) (Oral)   Resp 15   Ht 5\' 9"  (1.753 m)   Wt (!) 137.9 kg   LMP 01/27/2018 (Exact Date)   SpO2 100%   BMI 44.89 kg/m  Abd: gravid, ND, FHT present, mild tenderness on exam Extr: trace to 1+ bilateral pedal edema SVE: CERVIX: 10 cm dilated, 100 effaced, 0 station  EFM: FHR: 145 bpm, variability: moderate,  accelerations:  Present,  decelerations:  Present early decelerations Toco: Frequency: Every 2-3 minutes Labs: I have reviewed the patient's lab results.   Assessment & Plan:  G1P0000 @ [redacted]w[redacted]d, admitted for  Pregnancy and Labor/Delivery Management  1. Pain management: epidural. 2. FWB: FHT category II, overall reassuring.  3. ID: GBS negative 4. Labor management: Labor down  All discussed with patient, see orders   Rod Can, [facility] 11/01/2018  5:06 PM

## 2018-11-01 NOTE — Progress Notes (Signed)
  Labor Progress Note   28 y.o. G1P0000 @ [redacted]w[redacted]d , admitted for  Pregnancy, Labor Management. IOL  Subjective:  Pushing well with contractions  Objective:  BP 117/61   Pulse 71   Temp 100.1 F (37.8 C) (Oral)   Resp 15   Ht 5\' 9"  (1.753 m)   Wt (!) 137.9 kg   LMP 01/27/2018 (Exact Date)   SpO2 98%   BMI 44.89 kg/m   Temperature x2 axillary 99.6 and 100.1, with adjustment for axillary reading closer to 100.6 and 101.1 Abd: gravid, ND, FHT present, mild tenderness on exam Extr: trace to 1+ bilateral pedal edema SVE: CERVIX: complete/complete/+2  EFM: FHR: 150 bpm, variability: moderate,  accelerations:  Present,  decelerations:  Present early decelerations, most recent trend is to 160s following contraction Toco: Frequency: Every 2-4 minutes Labs: I have reviewed the patient's lab results.   Assessment & Plan:  G1P0000 @ [redacted]w[redacted]d, admitted for  Pregnancy and Labor/Delivery Management  1. Pain management: epidural. 2. FWB: FHT category II.  3. ID: GBS negative 4. Labor management: continue pitocin/now pushing 5. Will treat for suspected chorioamnionitis given maternal fever and fetal heart tones trending up.  All discussed with patient, see orders  Rod Can, [facility] 11/01/2018  7:34 PM

## 2018-11-01 NOTE — Discharge Summary (Addendum)
OB Discharge Summary     Patient Name: Claudia Byrd DOB: 1990/11/17 MRN: [id number]  Date of admission: 10/31/2018 Delivering MD: Hoyt Koch  Date of Delivery: 11/01/2018  Date of discharge: 11/03/2018  Admitting diagnosis: Induction of Labor Intrauterine pregnancy: [redacted]w[redacted]d     Secondary diagnosis: Chronic Hypertension     Discharge diagnosis: Term Pregnancy Delivered and [facility]                                                                                                Post partum procedures: None  Augmentation: Pitocin and Cytotec  SROM at 2:20 AM, 8/92/1194  Complications: chorioamnionitis with delivery 20 hours after rupture of membranes  Hospital course:  Induction of Labor With Vaginal Delivery   28 y.o. yo G1P0000 at [redacted]w[redacted]d was admitted to the hospital 10/31/2018 for induction of labor.  Indication for induction: Chronic Hypertension.  Patient had a labor course as follows: Membrane Rupture Time/Date: 2:20 AM ,11/01/2018   Intrapartum Procedures: Episiotomy: None [1]                                         Lacerations:  3rd degree [4]  Patient had delivery of a Viable infant.  Information for the patient's newborn:  Jadesola, [place] [[id number]]  Delivery Method: Vag-Vacuum   11/01/2018  Details of delivery can be found in separate delivery note.  Patient had a routine postpartum course. Patient is discharged home 11/03/18.  Physical exam  Vitals:   11/02/18 0810 11/02/18 1208 11/02/18 1528 11/02/18 2300  BP: 109/71 (!) 106/52 107/73 117/67  Pulse: 72 79 78 72  Resp: 18 20 17 18   Temp: (!) 97.4 F (36.3 C) 97.8 F (36.6 C) 97.8 F (36.6 C) 97.6 F (36.4 C)  TempSrc: Oral Oral Oral Oral  SpO2: 99% 98% 100% 99%  Weight:      Height:       General: alert, cooperative and no distress Lochia: appropriate Uterine Fundus: firm Incision: N/A DVT Evaluation: No evidence of DVT seen on physical exam.  Labs: Lab Results  Component Value Date   WBC 27.1 (H)  11/02/2018   HGB 8.8 (L) 11/02/2018   HCT 26.9 (L) 11/02/2018   MCV 81.5 11/02/2018   PLT 284 11/02/2018    Discharge instruction: per After Visit Summary.  Medications:  Allergies as of 11/03/2018      Reactions   Penicillins Itching   Sulfa Antibiotics       Medication List    STOP taking these medications   aspirin EC 81 MG tablet     TAKE these medications   loratadine 10 MG tablet Commonly known as:  CLARITIN Take 10 mg by mouth daily.   multivitamin-prenatal 27-0.8 MG Tabs tablet Take 1 tablet by mouth daily at 12 noon.       Diet: routine diet  Activity: Advance as tolerated. Pelvic rest for 6 weeks.   Outpatient follow up: [place]. Schedule an appointment as  soon as possible for a visit in 6 week(s).   Specialty:  Obstetrics and Gynecology Why:  postpartum follow up visit Contact information: [address] [place] [address] [phone]            Postpartum contraception: Progesterone only pills Rhogam Given postpartum: No, Rh positive Rubella vaccine given postpartum: Rubella Immune Varicella vaccine given postpartum: Varicella Immune TDaP given antepartum or postpartum: antepartum  Newborn Data: Live born female  Birth Weight: 8 lb, 4 oz APGAR: 8, 9  Newborn Delivery   Birth date/time:  11/01/2018 22:39:00 Delivery type:  Vaginal, Spontaneous      Baby Feeding: Breast  Disposition: home with mother  SIGNED:  Rexene Agent, CNM 11/03/2018 8:52 AM

## 2018-11-01 NOTE — Anesthesia Procedure Notes (Signed)
Epidural Patient location during procedure: OB Start time: 11/01/2018 3:53 AM End time: 11/01/2018 4:03 AM  Staffing Anesthesiologist: Alvin Critchley, MD Performed: anesthesiologist   Preanesthetic Checklist Completed: patient identified, site marked, surgical consent, pre-op evaluation, timeout performed, IV checked, risks and benefits discussed and monitors and equipment checked  Epidural Patient position: sitting Prep: Betadine Patient monitoring: heart rate, continuous pulse ox and blood pressure Approach: midline Location: L3-L4 Injection technique: LOR air  Needle:  Needle type: Tuohy  Needle gauge: 17 G Needle length: 9 cm and 9 Catheter type: closed end flexible Catheter size: 20 Guage Test dose: negative and 1.5% lidocaine with Epi 1:200 K  Assessment Sensory level: T8 Events: blood not aspirated, injection not painful, no injection resistance, negative IV test and no paresthesia  Additional Notes Time out called.  Patient placed in sitting position. Back prepped and draped in sterile fashion.  A skin wheal was made in the L3-L4 interspace with 1% Lidocaine plain.  A 17G Tuohy needle was advanced into the epidural space by a loss of resistance technique.  No blood or paresthesias.  The epidural catheter was advanced 3cm and the TD was negative.  The patient tolerated the procedure well and the catheter was affixed to the back in sterile fashion.Reason for block:procedure for pain

## 2018-11-01 NOTE — Progress Notes (Signed)
   Subjective:  Comfortable, still very posterior cerivx and significant discomfort with cervical exam  Objective:   Vitals: Blood pressure 122/66, pulse 66, temperature 98.3 F (36.8 C), temperature source Oral, resp. rate 18, height 5\' 9"  (1.753 m), weight (!) 137.9 kg, last menstrual period 01/27/2018. General: NAD Abdomen: Gravid, non-tender Cervical Exam:  Dilation: 3 Effacement (%): 50 Cervical Position: Posterior Station: -3 Presentation: Vertex Exam by:: Trinidee Schrag  FHT: 145, moderate, +accels, no decels Toco: q1-35min, occasional couplets  Results for orders placed or performed during the hospital encounter of 10/31/18 (from the past 24 hour(s))  CBC     Status: Abnormal   Collection Time: 10/31/18  9:11 AM  Result Value Ref Range   WBC 11.6 (H) 4.0 - 10.5 K/uL   RBC 4.63 3.87 - 5.11 MIL/uL   Hemoglobin 12.2 12.0 - 15.0 g/dL   HCT 37.0 36.0 - 46.0 %   MCV 79.9 (L) 80.0 - 100.0 fL   MCH 26.3 26.0 - 34.0 pg   MCHC 33.0 30.0 - 36.0 g/dL   RDW 12.9 11.5 - 15.5 %   Platelets 289 150 - 400 K/uL   nRBC 0.0 0.0 - 0.2 %  Type and screen     Status: None   Collection Time: 10/31/18  9:11 AM  Result Value Ref Range   ABO/RH(D) O POS    Antibody Screen NEG    Sample Expiration      11/03/2018,2359 Performed at [facility], [address] [place], [place] [address]     Assessment:   28 y.o. G1P0000 [redacted]w[redacted]d IOL CHTN  Plan:   1) Labor  - start pitocin - SROM 02:20 SROM light meconium  2) Fetus - cat I tracing  3) CHTN - normotensive, unmedicated   [name], MD, [place], [facility] 11/01/2018, 2:55 AM

## 2018-11-01 NOTE — Anesthesia Preprocedure Evaluation (Signed)
Anesthesia Evaluation  Patient identified by MRN, date of birth, ID band Patient awake    Reviewed: Allergy & Precautions, NPO status , Patient's Chart, lab work & pertinent test results  Airway Mallampati: II  TM Distance: >3 FB     Dental   Pulmonary neg pulmonary ROS,    Pulmonary exam normal        Cardiovascular hypertension, Normal cardiovascular exam     Neuro/Psych negative neurological ROS  negative psych ROS   GI/Hepatic negative GI ROS, Neg liver ROS,   Endo/Other  Morbid obesity  Renal/GU negative Renal ROS  negative genitourinary   Musculoskeletal negative musculoskeletal ROS (+)   Abdominal Normal abdominal exam  (+)   Peds negative pediatric ROS (+)  Hematology negative hematology ROS (+)   Anesthesia Other Findings   Reproductive/Obstetrics (+) Pregnancy                             Anesthesia Physical Anesthesia Plan  ASA: II  Anesthesia Plan: Epidural   Post-op Pain Management:    Induction:   PONV Risk Score and Plan:   Airway Management Planned: Natural Airway  Additional Equipment:   Intra-op Plan:   Post-operative Plan:   Informed Consent: I have reviewed the patients History and Physical, chart, labs and discussed the procedure including the risks, benefits and alternatives for the proposed anesthesia with the patient or authorized representative who has indicated his/her understanding and acceptance.     Dental advisory given  Plan Discussed with: CRNA and Surgeon  Anesthesia Plan Comments:         Anesthesia Quick Evaluation

## 2018-11-01 NOTE — Progress Notes (Signed)
   Subjective:  Comfortable with epidural in place  Objective:   Vitals: Blood pressure (!) 110/58, pulse 67, temperature 98.3 F (36.8 C), temperature source Oral, resp. rate 15, height 5\' 9"  (1.753 m), weight (!) 137.9 kg, last menstrual period 01/27/2018, SpO2 98 %. General: NAD Abdomen: gravid, non-tender Cervical Exam:  Dilation: 4 Effacement (%): 50 Cervical Position: Middle Station: -3 Presentation: Vertex Exam by:: Vernie Vinciguerra Cervix significantly more anterior than on prior checks  FHT: 150, moderate, +accels, occasional late decelerations Toco: q2-15min  Results for orders placed or performed during the hospital encounter of 10/31/18 (from the past 24 hour(s))  CBC     Status: Abnormal   Collection Time: 10/31/18  9:11 AM  Result Value Ref Range   WBC 11.6 (H) 4.0 - 10.5 K/uL   RBC 4.63 3.87 - 5.11 MIL/uL   Hemoglobin 12.2 12.0 - 15.0 g/dL   HCT 37.0 36.0 - 46.0 %   MCV 79.9 (L) 80.0 - 100.0 fL   MCH 26.3 26.0 - 34.0 pg   MCHC 33.0 30.0 - 36.0 g/dL   RDW 12.9 11.5 - 15.5 %   Platelets 289 150 - 400 K/uL   nRBC 0.0 0.0 - 0.2 %  Type and screen     Status: None   Collection Time: 10/31/18  9:11 AM  Result Value Ref Range   ABO/RH(D) O POS    Antibody Screen NEG    Sample Expiration      11/03/2018,2359 Performed at [facility], [address], [place], [place] [address]     Assessment:   28 y.o. G1P0000 [redacted]w[redacted]d IOL CHTN  Plan:   1) Labor - continue pitocin currently at 10, IUPC placed to aid in titration and allow lateral positioning  2) Fetus - Category II, good variability and accels but occasional late decelerations noted.  These resolved with lateral positioning but unable to trace contractions so IUPC placed this check  3) CHTN - normotensive  [name], MD, [place], [facility] 11/01/2018, 8:10 AM

## 2018-11-02 LAB — CBC
HCT: 26.9 % — ABNORMAL LOW (ref 36.0–46.0)
Hemoglobin: 8.8 g/dL — ABNORMAL LOW (ref 12.0–15.0)
MCH: 26.7 pg (ref 26.0–34.0)
MCHC: 32.7 g/dL (ref 30.0–36.0)
MCV: 81.5 fL (ref 80.0–100.0)
Platelets: 284 10*3/uL (ref 150–400)
RBC: 3.3 MIL/uL — ABNORMAL LOW (ref 3.87–5.11)
RDW: 12.7 % (ref 11.5–15.5)
WBC: 27.1 10*3/uL — ABNORMAL HIGH (ref 4.0–10.5)
nRBC: 0 % (ref 0.0–0.2)

## 2018-11-02 LAB — RPR: RPR Ser Ql: NONREACTIVE

## 2018-11-02 MED ORDER — ONDANSETRON HCL 4 MG PO TABS: 4.0000 mg | ORAL_TABLET | ORAL | Status: DC | PRN

## 2018-11-02 MED ORDER — SENNOSIDES-DOCUSATE SODIUM 8.6-50 MG PO TABS
2.0000 | ORAL_TABLET | ORAL | Status: DC
  Administered 2018-11-02 – 2018-11-03 (×2): 2 via ORAL
  Filled 2018-11-02 (×2): qty 2

## 2018-11-02 MED ORDER — ONDANSETRON HCL 4 MG/2ML IJ SOLN: 4.0000 mg | INTRAMUSCULAR | Status: DC | PRN

## 2018-11-02 MED ORDER — WITCH HAZEL-GLYCERIN EX PADS: 1.0000 "application " | MEDICATED_PAD | CUTANEOUS | Status: DC | PRN

## 2018-11-02 MED ORDER — COCONUT OIL OIL
1.0000 "application " | TOPICAL_OIL | Status: DC | PRN
  Filled 2018-11-02: qty 120

## 2018-11-02 MED ORDER — DIBUCAINE (PERIANAL) 1 % EX OINT: 1.0000 "application " | TOPICAL_OINTMENT | CUTANEOUS | Status: DC | PRN

## 2018-11-02 MED ORDER — SENNOSIDES-DOCUSATE SODIUM 8.6-50 MG PO TABS: 2.0000 | ORAL_TABLET | ORAL | Status: DC

## 2018-11-02 MED ORDER — DIPHENHYDRAMINE HCL 25 MG PO CAPS: 25.0000 mg | ORAL_CAPSULE | Freq: Four times a day (QID) | ORAL | Status: DC | PRN

## 2018-11-02 MED ORDER — PRENATAL MULTIVITAMIN CH
1.0000 | ORAL_TABLET | Freq: Every day | ORAL | Status: DC
  Administered 2018-11-02: 14:00:00 1 via ORAL
  Filled 2018-11-02: qty 1

## 2018-11-02 MED ORDER — IBUPROFEN 600 MG PO TABS
600.0000 mg | ORAL_TABLET | Freq: Four times a day (QID) | ORAL | Status: DC
  Administered 2018-11-02 – 2018-11-03 (×5): 600 mg via ORAL
  Filled 2018-11-02 (×5): qty 1

## 2018-11-02 MED ORDER — IBUPROFEN 600 MG PO TABS
ORAL_TABLET | ORAL | Status: AC
  Filled 2018-11-02: qty 1

## 2018-11-02 MED ORDER — ACETAMINOPHEN 325 MG PO TABS
650.0000 mg | ORAL_TABLET | ORAL | Status: DC | PRN
  Administered 2018-11-02 – 2018-11-03 (×3): 650 mg via ORAL
  Filled 2018-11-02 (×3): qty 2

## 2018-11-02 MED ORDER — BENZOCAINE-MENTHOL 20-0.5 % EX AERO
1.0000 "application " | INHALATION_SPRAY | CUTANEOUS | Status: DC | PRN
  Filled 2018-11-02: qty 56

## 2018-11-02 MED ORDER — IBUPROFEN 600 MG PO TABS
600.0000 mg | ORAL_TABLET | Freq: Four times a day (QID) | ORAL | Status: DC
  Administered 2018-11-02: 01:00:00 600 mg via ORAL

## 2018-11-02 MED ORDER — SIMETHICONE 80 MG PO CHEW: 80.0000 mg | CHEWABLE_TABLET | ORAL | Status: DC | PRN

## 2018-11-02 NOTE — Lactation Note (Signed)
This note was copied from a baby's chart. Lactation Consultation Note  Patient Name: Claudia Byrd [id number] Date: 11/02/2018 Reason for consult: Follow-up assessment;Mother's request   Maternal Data    Feeding Feeding Type: Breast Fed  LATCH Score Latch: Grasps breast easily, tongue down, lips flanged, rhythmical sucking.  Audible Swallowing: A few with stimulation  Type of Nipple: Everted at rest and after stimulation  Comfort (Breast/Nipple): Filling, red/small blisters or bruises, mild/mod discomfort  Hold (Positioning): Assistance needed to correctly position infant at breast and maintain latch.  LATCH Score: 7  Interventions Interventions: Breast feeding basics reviewed  Lactation Tools Discussed/Used Tools: Nipple Shields Nipple shield size: 20   Consult Status Consult Status: Follow-up  LC believes that baby may have a potential upper lip tie. Nipple shield should be used at every feeding to see if this makes feedings easier for baby and mom. LC will determine feeding plan after reviewing baby's wt loss at 24hrs. As of now, breastfeeding is WDL. Following baby's feeding cues/body language.   Marnee Spring 11/02/2018, 4:06 PM

## 2018-11-02 NOTE — Lactation Note (Signed)
This note was copied from a baby's chart. Lactation Consultation Note  Patient Name: Girl Alfhild Partch [id number] Date: 11/02/2018 Reason for consult: Follow-up assessment;Mother's request   Maternal Data Has patient been taught Hand Expression?: Yes Does the patient have breastfeeding experience prior to this delivery?: No  Feeding Feeding Type: Breast Fed  LATCH Score Latch: Grasps breast easily, tongue down, lips flanged, rhythmical sucking.  Audible Swallowing: A few with stimulation  Type of Nipple: Everted at rest and after stimulation  Comfort (Breast/Nipple): Filling, red/small blisters or bruises, mild/mod discomfort  Hold (Positioning): Assistance needed to correctly position infant at breast and maintain latch.  LATCH Score: 7  Interventions Interventions: Breast feeding basics reviewed;Assisted with latch;Skin to skin;Hand express;Support pillows;Position options;Coconut oil;Comfort gels  Lactation Tools Discussed/Used     Consult Status Consult Status: Follow-up  LC spoke with parents about: clusterfeeding, infant feeding cues, I/Os, positioning/latch, how to comfort mom's nipples after baby comes off due to soreness.  Parents have a better understanding of how breastfeeding works and understand the physiology of making more milk by putting baby to breast during cues.  Day worked with family for two feeding sessions and will f/u with them this afternoon.    Marnee Spring 11/02/2018, 12:57 PM

## 2018-11-02 NOTE — Anesthesia Postprocedure Evaluation (Signed)
Anesthesia Post Note  Patient: Claudia Byrd  Procedure(s) Performed: AN AD Wheeler  Patient location during evaluation: Mother Baby Anesthesia Type: Epidural Level of consciousness: oriented and awake and alert Pain management: pain level controlled Vital Signs Assessment: post-procedure vital signs reviewed and stable Respiratory status: spontaneous breathing and respiratory function stable Cardiovascular status: blood pressure returned to baseline and stable Postop Assessment: no headache, no backache, no apparent nausea or vomiting and able to ambulate Anesthetic complications: no     Last Vitals:  Vitals:   11/02/18 0301 11/02/18 0810  BP: 115/66 109/71  Pulse: 81 72  Resp: 18 18  Temp: 36.7 C (!) 36.3 C  SpO2: 98% 99%    Last Pain:  Vitals:   11/02/18 0810  TempSrc: Oral  PainSc:                  Alison Stalling

## 2018-11-02 NOTE — Progress Notes (Signed)
   Subjective:  Doing well no concerns.  No fevers chills, tolerating po, appropriate lochia  Objective:  Vital signs in last 24 hours: Temp:  [97.4 F (36.3 C)-100.1 F (37.8 C)] 97.4 F (36.3 C) (05/12 0810) Pulse Rate:  [64-109] 72 (05/12 0810) Resp:  [18] 18 (05/12 0810) BP: (93-130)/(42-101) 109/71 (05/12 0810) SpO2:  [95 %-100 %] 99 % (05/12 0810)    General: NAD Pulmonary: no increased work of breathing Abdomen: non-distended, non-tender, fundus firm at level of umbilicus Extremities: no edema, no erythema, no tenderness  Results for orders placed or performed during the hospital encounter of 10/31/18 (from the past 72 hour(s))  CBC     Status: Abnormal   Collection Time: 10/31/18  9:11 AM  Result Value Ref Range   WBC 11.6 (H) 4.0 - 10.5 K/uL   RBC 4.63 3.87 - 5.11 MIL/uL   Hemoglobin 12.2 12.0 - 15.0 g/dL   HCT 37.0 36.0 - 46.0 %   MCV 79.9 (L) 80.0 - 100.0 fL   MCH 26.3 26.0 - 34.0 pg   MCHC 33.0 30.0 - 36.0 g/dL   RDW 12.9 11.5 - 15.5 %   Platelets 289 150 - 400 K/uL   nRBC 0.0 0.0 - 0.2 %    Comment: Performed at [facility] [address] [place], [place] [address]  RPR     Status: None   Collection Time: 10/31/18  9:11 AM  Result Value Ref Range   RPR Ser Ql Non Reactive Non Reactive    Comment: (NOTE) Performed At: [facility] [place], [place] [id number] Rush Farmer MD HF:[id number]   Type and screen     Status: None   Collection Time: 10/31/18  9:11 AM  Result Value Ref Range   ABO/RH(D) O POS    Antibody Screen NEG    Sample Expiration      11/03/2018,2359 Performed at [facility], [address] [place], [place] [address]   CBC     Status: Abnormal   Collection Time: 11/02/18  4:44 AM  Result Value Ref Range   WBC 27.1 (H) 4.0 - 10.5 K/uL   RBC 3.30 (L) 3.87 - 5.11 MIL/uL   Hemoglobin 8.8 (L) 12.0 - 15.0 g/dL    Comment: REPEATED TO VERIFY   HCT 26.9 (L) 36.0 - 46.0 %   MCV 81.5 80.0 -  100.0 fL   MCH 26.7 26.0 - 34.0 pg   MCHC 32.7 30.0 - 36.0 g/dL   RDW 12.7 11.5 - 15.5 %   Platelets 284 150 - 400 K/uL   nRBC 0.0 0.0 - 0.2 %    Comment: Performed at [facility], [address] [place], [place] [address]    Assessment:   28 y.o. G1P1001 postpartum day # 1 VAVD  Plan:    1) Acute blood loss anemia - hemodynamically stable and asymptomatic - po ferrous sulfate  2) Blood Type --/--/O POS (05/10 6767) / Rubella 5.81 (09/18 1153) / Varicella Immune  3) TDAP status up to date  4) Feeding plan breast  5)  Education given regarding options for contraception, as well as compatibility with breast feeding if applicable.  Patient plans on oral progesterone-only contraceptive for contraception.  6) Disposition  [name], MD, [place], [facility] 11/02/2018, 9:15 AM

## 2018-11-02 NOTE — Progress Notes (Signed)
Patient doing well this morning. Fundus firm, bleeding small. Tolerating PO food/fluids. Voiding appropriately. Pain controlled with oral pain meds. Hemoglobin 8.8 this morning, from 12.2 on 5/10. Patient states no dizziness or lightheadedness while lying in bed or ambulating. Patient color appropriate, and patient states "I always look pale." RN to continue to monitor.  Patient breastfeeding infant. Needing some assistance with latching. RN reviewed breastfeeding basics. LC to be consulted today.

## 2018-11-03 NOTE — Progress Notes (Signed)
DC to home with NB via wc

## 2018-11-03 NOTE — Progress Notes (Signed)
DC inst reviewed with pt.  Verb u/o of f/u care and appointment to be made.  Discussed need for continued stool softners until pp check.  Reviewed red flag warning s/s to report to MD.  Pt verb u/o.

## 2018-12-09 ENCOUNTER — Other Ambulatory Visit: Payer: Self-pay

## 2018-12-09 ENCOUNTER — Ambulatory Visit (INDEPENDENT_AMBULATORY_CARE_PROVIDER_SITE_OTHER): Payer: BC Managed Care – PPO | Admitting: Obstetrics & Gynecology

## 2018-12-09 ENCOUNTER — Encounter: Payer: Self-pay | Admitting: Obstetrics & Gynecology

## 2018-12-09 DIAGNOSIS — Z1389 Encounter for screening for other disorder: Secondary | ICD-10-CM | POA: Diagnosis not present

## 2018-12-09 DIAGNOSIS — Z30011 Encounter for initial prescription of contraceptive pills: Secondary | ICD-10-CM

## 2018-12-09 MED ORDER — NORETHIN ACE-ETH ESTRAD-FE 1-20 MG-MCG PO TABS: 1.0000 | ORAL_TABLET | Freq: Every day | ORAL | 11 refills | Status: DC

## 2018-12-09 NOTE — Progress Notes (Signed)
Virtual Visit via Telephone Note  I connected with Claudia Byrd on 12/09/18 at  1:50 PM EDT by telephone and verified that I am speaking with the correct person using two identifiers.   I discussed the limitations, risks, security and privacy concerns of performing an evaluation and management service by telephone and the availability of in person appointments. I also discussed with the patient that there may be a patient responsible charge related to this service. The patient expressed understanding and agreed to proceed. She was at home and I was in my office.  History of Present Illness: OBSTETRICS POSTPARTUM CLINIC PROGRESS NOTE  Subjective:     Claudia Byrd is a 28 y.o. G107P1001 female who presents for a postpartum visit. She is 6 weeks postpartum following a Term pregnancy and delivery by vacuum assisted vaginal delivery.  I have fully reviewed the prenatal and intrapartum course. Anesthesia: epidural.  Postpartum course has been complicated by uncomplicated.  Baby is feeding by Bottle.  Bleeding: patient has not  resumed menses.  Bowel function is normal. Bladder function is normal.  Patient is not sexually active. Contraception method desired is OCP (estrogen/progesterone).  Postpartum depression screening: negative. Edinburgh 4.  The following portions of the patient's history were reviewed and updated as appropriate: allergies, current medications, past family history, past medical history, past social history, past surgical history and problem list.  Review of Systems Pertinent items are noted in HPI.  Observations/Objective: No exam today, due to telephone eVisit due to [facility] virus restriction on elective visits and procedures.  Prior visits reviewed along with ultrasounds/labs as indicated.  Assessment and Plan: 1. Postpartum care following vaginal delivery  2. Encounter for initial prescription of contraceptive pills OCP to start this Sunday; Rx  Follow Up  Instructions: 3 mos PAP visit   I discussed the assessment and treatment plan with the patient. The patient was provided an opportunity to ask questions and all were answered. The patient agreed with the plan and demonstrated an understanding of the instructions.   The patient was advised to call back or seek an in-person evaluation if the symptoms worsen or if the condition fails to improve as anticipated.  I provided 11 minutes of non-face-to-face time during this encounter.   Hoyt Koch, MD

## 2018-12-16 ENCOUNTER — Telehealth: Payer: Self-pay

## 2018-12-16 NOTE — Telephone Encounter (Signed)
[name] from [facility] calling to ask when preg was confirmed and did we tx her 11/22/27-02/21/18.  [phone]  Spoke c [name] pt had preconceptual counseling 01/13/18; confirmation 03-10-18; del 10/31/88.

## 2019-02-16 ENCOUNTER — Encounter: Payer: Self-pay | Admitting: Obstetrics and Gynecology

## 2019-02-16 ENCOUNTER — Other Ambulatory Visit: Payer: Self-pay

## 2019-02-16 ENCOUNTER — Ambulatory Visit (INDEPENDENT_AMBULATORY_CARE_PROVIDER_SITE_OTHER): Payer: BC Managed Care – PPO | Admitting: Obstetrics and Gynecology

## 2019-02-16 VITALS — BP 132/86 | HR 98 | Ht 69.0 in | Wt 286.0 lb

## 2019-02-16 DIAGNOSIS — N83201 Unspecified ovarian cyst, right side: Secondary | ICD-10-CM

## 2019-02-16 NOTE — Progress Notes (Signed)
Obstetrics & Gynecology Office Visit   Chief Complaint:  Chief Complaint  Patient presents with  . Ovarian Cyst    ER follow up    History of Present Illness: The patient is a 28 y.o. female presenting for emergency room follow up concerning a recently imaged bilateral adnexal cysts.  Initial presentation was prompted by abdominal pain.  Previous CT imaging demonstrated dimensions of 7.3c x 6.8 x 5.76m right ovarian cyst and  02/12/2019, follow up pelvic ultrasound showed normal doppler flow and a 3.9 x 1.8 x 2.2cm involuting hemorrhagic cyst on the left ovary.  This cyst was incidentaly imaged on 03/19/2018 at a routine prenatal ultrasound.  It has remained stable in regard to dimensions. .  Appearance was notable simple cyst, normal doppler flow, no free fluid, no lymphadenopathy, no omental caking and absence of ascites. The patient endorses associated symptoms pain at time of presentation to ER 02/12/2019 and precipitated by intercourse.  No other symptoms noted.  There is not a notable family history of ovarian cancer, uterine cancer, breast cancer, or colon cancer.  Review of Systems: 10 point review of systems negative unless otherwise noted in HPI  Past Medical History:  Past Medical History:  Diagnosis Date  . Hypertension     Past Surgical History:  Past Surgical History:  Procedure Laterality Date  . TONSILLECTOMY      Gynecologic History: Patient's last menstrual period was 01/26/2019.  Obstetric History: G1P1001  Family History:  History reviewed. No pertinent family history.  Social History:  Social History   Socioeconomic History  . Marital status: Married    Spouse name: Not on file  . Number of children: Not on file  . Years of education: Not on file  . Highest education level: Not on file  Occupational History  . Not on file  Social Needs  . Financial resource strain: Not on file  . Food insecurity    Worry: Not on file    Inability: Not on file  .  Transportation needs    Medical: Not on file    Non-medical: Not on file  Tobacco Use  . Smoking status: Never Smoker  . Smokeless tobacco: Never Used  Substance and Sexual Activity  . Alcohol use: Never    Frequency: Never  . Drug use: Never  . Sexual activity: Yes    Partners: Male    Birth control/protection: Pill  Lifestyle  . Physical activity    Days per week: Not on file    Minutes per session: Not on file  . Stress: Not on file  Relationships  . Social Herbalist on phone: Not on file    Gets together: Not on file    Attends religious service: Not on file    Active member of club or organization: Not on file    Attends meetings of clubs or organizations: Not on file    Relationship status: Not on file  . Intimate partner violence    Fear of current or ex partner: Not on file    Emotionally abused: Not on file    Physically abused: Not on file    Forced sexual activity: Not on file  Other Topics Concern  . Not on file  Social History Narrative  . Not on file    Allergies:  Allergies  Allergen Reactions  . Penicillins Itching  . Sulfa Antibiotics     Medications: Prior to Admission medications   Medication Sig Start Date  End Date Taking? Authorizing Provider  loratadine (CLARITIN) 10 MG tablet Take 10 mg by mouth daily.   Yes [provider]  norethindrone-ethinyl estradiol (JUNEL FE 1/20) 1-20 MG-MCG tablet Take 1 tablet by mouth daily. 12/09/18  Yes [name], MD  Prenatal Vit-Fe Fumarate-FA (MULTIVITAMIN-PRENATAL) 27-0.8 MG TABS tablet Take 1 tablet by mouth daily at 12 noon.    [provider]    Physical Exam Vitals:  Vitals:   02/16/19 1357  BP: 132/86  Pulse: 98   Patient's last menstrual period was 01/26/2019.  General: NAD, well nourished, appears stated age 38: normocephalic, anicteric Pulmonary: No increased work of breathing Extremities: no edema, erythema, or tenderness Neurologic: Grossly intact  Psychiatric: mood appropriate, affect full  Female chaperone present for pelvic and breast  portions of the physical exam  No results found.  Assessment: 27 y.o. G1P1001 presenting for ER follow up of right ovarian cyst  Plan: Problem List Items Addressed This Visit    None    Visit Diagnoses    Right ovarian cyst    -  Primary       1) The incidence and implication of adnexal masses and ovarian cysts were discussed with the patient in detail.  Prior imaging if available was reviewed at today's visit..  The vast majority of these lesions will represent benign or physiologic processes and may well resolve on repeat imaging with expectant management.  We discussed that in a premenopausal patient not on ovulation suppression with via a systemic form hormonal contraception the normal function of the ovary during follicular development is the formation of a dominant follicle or cyst(s) every month.  This is an essential part of normal reproductive physiology.  In some cases these cysts can take on larger dimensions, hemorrhage, or undergo torsion making them symptomatic. Torsion is relatively unlikely for lesions under 5 cm.  Based on initial imaging findings the overall concern for malignancy is deemed low, particularly given stability over the past year.  Given persistant nature and recent episode of pain will proceed with laparoscopic right ovarian cystectomy, possible left ovarian cystectomy.  Torsion precautions were given.    2) Tumor makers were not ordered  3) Return if symptoms worsen or fail to improve.   [name], MD, [facility], [place]

## 2019-02-18 ENCOUNTER — Telehealth: Payer: Self-pay | Admitting: Obstetrics and Gynecology

## 2019-02-18 NOTE — Telephone Encounter (Signed)
-----   Message from [name], MD sent at 02/16/2019  7:14 PM EDT ----- Regarding: Surgery Surgery Date: 1-4 weeks (Level 3)  LOS: same day surgery  Surgery Booking Request Patient Full Name: Claudia Byrd MRN: [id number]  DOB: 1990/12/24  Surgeon: [name], MD  Requested Surgery Date and Time: 1-4 weeks Primary Diagnosis and Code: Right ovarian cyst N83.201 Secondary Diagnosis and Code:  Surgical Procedure: laparoscopic left ovarian cystectomy, possible right ovarian cystectomy L&D Notification:N/A Admission Status: same day surgery Length of Surgery: 1hr Special Case Needs: none H&P: week of (date) Phone Interview or Office Pre-Admit: pre-admit Interpreter: No Language: English Medical Clearance: No Special Scheduling Instructions: none

## 2019-02-18 NOTE — Telephone Encounter (Signed)
Lmtrc

## 2019-02-18 NOTE — Telephone Encounter (Signed)
Patient is aware of H&P/consents on day of surgery, Pre-admit testing to be scheduled, Covid testing 02/21/19 @ 8-10:30am at [facility], and OR on 02/24/19. Patient is aware she will be asked to quarantine after Covid testing. Patient is aware she may receive calls from the [facility] and [facility].

## 2019-02-18 NOTE — Telephone Encounter (Signed)
Dates given. Patient to call husband and call back.

## 2019-02-20 ENCOUNTER — Other Ambulatory Visit: Payer: Self-pay | Admitting: Obstetrics and Gynecology

## 2019-02-21 ENCOUNTER — Other Ambulatory Visit
Admission: RE | Admit: 2019-02-21 | Discharge: 2019-02-21 | Disposition: A | Discharge status: Home / Self Care | Payer: BC Managed Care – PPO | Source: Ambulatory Visit | Attending: Obstetrics and Gynecology | Admitting: Obstetrics and Gynecology

## 2019-02-21 ENCOUNTER — Other Ambulatory Visit: Payer: Self-pay

## 2019-02-21 DIAGNOSIS — N83201 Unspecified ovarian cyst, right side: Secondary | ICD-10-CM | POA: Insufficient documentation

## 2019-02-21 DIAGNOSIS — Z20828 Contact with and (suspected) exposure to other viral communicable diseases: Secondary | ICD-10-CM | POA: Insufficient documentation

## 2019-02-21 DIAGNOSIS — Z01812 Encounter for preprocedural laboratory examination: Secondary | ICD-10-CM | POA: Diagnosis not present

## 2019-02-21 LAB — SARS CORONAVIRUS 2 (TAT 6-24 HRS): SARS Coronavirus 2: NEGATIVE

## 2019-02-22 ENCOUNTER — Encounter
Admission: RE | Admit: 2019-02-22 | Discharge: 2019-02-22 | Disposition: A | Discharge status: Home / Self Care | Payer: BC Managed Care – PPO | Source: Ambulatory Visit | Attending: Obstetrics and Gynecology | Admitting: Obstetrics and Gynecology

## 2019-02-22 ENCOUNTER — Other Ambulatory Visit: Payer: Self-pay

## 2019-02-22 HISTORY — DX: Gastro-esophageal reflux disease without esophagitis: K21.9

## 2019-02-22 NOTE — Patient Instructions (Signed)
Your procedure is scheduled on: 01/24/2019 Thurs Report to Same Day Surgery 2nd floor medical mall [facility] Entrance-take elevator on left to 2nd floor.  Check in with surgery information desk.) To find out your arrival time please call [phone] between 1PM - 3PM on 01/23/2019 Wed  Remember: Instructions that are not followed completely may result in serious medical risk, up to and including death, or upon the discretion of your surgeon and anesthesiologist your surgery may need to be rescheduled.    _x___ 1. Do not eat food after midnight the night before your procedure. You may drink clear liquids up to 2 hours before you are scheduled to arrive at the hospital for your procedure.  Do not drink clear liquids within 2 hours of your scheduled arrival to the hospital.  Clear liquids include  --Water or Apple juice without pulp  --Clear carbohydrate beverage such as ClearFast or Gatorade  --Black Coffee or Clear Tea (No milk, no creamers, do not add anything to                  the coffee or Tea Type 1 and type 2 diabetics should only drink water.   ____Ensure clear carbohydrate drink on the way to the hospital for bariatric patients  ____Ensure clear carbohydrate drink 3 hours before surgery.   No gum chewing or hard candies.     __x__ 2. No Alcohol for 24 hours before or after surgery.   __x__3. No Smoking or e-cigarettes for 24 prior to surgery.  Do not use any chewable tobacco products for at least 6 hour prior to surgery   ____  4. Bring all medications with you on the day of surgery if instructed.    __x__ 5. Notify your doctor if there is any change in your medical condition     (cold, fever, infections).    x___6. On the morning of surgery brush your teeth with toothpaste and water.  You may rinse your mouth with mouth wash if you wish.  Do not swallow any toothpaste or mouthwash.   Do not wear jewelry, make-up, hairpins, clips or nail polish.  Do not wear lotions,  powders, or perfumes. You may wear deodorant.  Do not shave 48 hours prior to surgery. Men may shave face and neck.  Do not bring valuables to the hospital.    [facility] is not responsible for any belongings or valuables.               Contacts, dentures or bridgework may not be worn into surgery.  Leave your suitcase in the car. After surgery it may be brought to your room.  For patients admitted to the hospital, discharge time is determined by your                       treatment team.  _  Patients discharged the day of surgery will not be allowed to drive home.  You will need someone to drive you home and stay with you the night of your procedure.    Please read over the following fact sheets that you were given:   [facility] Preparing for Surgery and or MRSA Information   _x___ Take anti-hypertensive listed below, cardiac, seizure, asthma,     anti-reflux and psychiatric medicines. These include:  1. omeprazole (PRILOSEC OTC) 20 MG tablet the night before and the morning of surgery  2.  3.  4.  5.  6.  ____Fleets  enema or Magnesium Citrate as directed.   _x___ Use CHG Soap or sage wipes as directed on instruction sheet   ____ Use inhalers on the day of surgery and bring to hospital day of surgery  ____ Stop Metformin and Janumet 2 days prior to surgery.    ____ Take 1/2 of usual insulin dose the night before surgery and none on the morning     surgery.   _x___ Follow recommendations from Cardiologist, Pulmonologist or PCP regarding          stopping Aspirin, Coumadin, Plavix ,Eliquis, Effient, or Pradaxa, and Pletal.  X____Stop Anti-inflammatories such as Advil, Aleve, Ibuprofen, Motrin, Naproxen, Naprosyn, Goodies powders or aspirin products. OK to take Tylenol and                          Celebrex.   _x___ Stop supplements until after surgery.  But may continue Vitamin D, Vitamin B,       and multivitamin.   ____ Bring C-Pap to the hospital.

## 2019-02-22 NOTE — Pre-Procedure Instructions (Signed)
Patient notified regarding carbohydrate drink.

## 2019-02-24 ENCOUNTER — Ambulatory Visit: Payer: BC Managed Care – PPO | Admitting: Certified Registered Nurse Anesthetist

## 2019-02-24 ENCOUNTER — Ambulatory Visit
Admission: RE | Admit: 2019-02-24 | Discharge: 2019-02-24 | Disposition: A | Discharge status: Home / Self Care | Payer: BC Managed Care – PPO | Attending: Obstetrics and Gynecology | Admitting: Obstetrics and Gynecology

## 2019-02-24 ENCOUNTER — Encounter
Admission: RE | Disposition: A | Discharge status: Home / Self Care | Payer: Self-pay | Source: Home / Self Care | Attending: Obstetrics and Gynecology

## 2019-02-24 ENCOUNTER — Other Ambulatory Visit: Payer: Self-pay

## 2019-02-24 DIAGNOSIS — I10 Essential (primary) hypertension: Secondary | ICD-10-CM | POA: Insufficient documentation

## 2019-02-24 DIAGNOSIS — Z880 Allergy status to penicillin: Secondary | ICD-10-CM | POA: Insufficient documentation

## 2019-02-24 DIAGNOSIS — D270 Benign neoplasm of right ovary: Secondary | ICD-10-CM | POA: Diagnosis not present

## 2019-02-24 DIAGNOSIS — Z793 Long term (current) use of hormonal contraceptives: Secondary | ICD-10-CM | POA: Insufficient documentation

## 2019-02-24 DIAGNOSIS — N83201 Unspecified ovarian cyst, right side: Secondary | ICD-10-CM | POA: Diagnosis present

## 2019-02-24 DIAGNOSIS — Z9889 Other specified postprocedural states: Secondary | ICD-10-CM

## 2019-02-24 DIAGNOSIS — Z882 Allergy status to sulfonamides status: Secondary | ICD-10-CM | POA: Insufficient documentation

## 2019-02-24 HISTORY — PX: LAPAROSCOPIC OVARIAN CYSTECTOMY: SHX6248

## 2019-02-24 LAB — TYPE AND SCREEN
ABO/RH(D): O POS
Antibody Screen: NEGATIVE

## 2019-02-24 LAB — POCT PREGNANCY, URINE: Preg Test, Ur: NEGATIVE

## 2019-02-24 SURGERY — EXCISION, CYST, OVARY, LAPAROSCOPIC
Anesthesia: General | Laterality: Right

## 2019-02-24 MED ORDER — FENTANYL CITRATE (PF) 100 MCG/2ML IJ SOLN: 25.0000 ug | INTRAMUSCULAR | Status: DC | PRN

## 2019-02-24 MED ORDER — SUGAMMADEX SODIUM 200 MG/2ML IV SOLN
INTRAVENOUS | Status: DC | PRN
  Administered 2019-02-24: 254 mg via INTRAVENOUS

## 2019-02-24 MED ORDER — PHENYLEPHRINE HCL (PRESSORS) 10 MG/ML IV SOLN
INTRAVENOUS | Status: DC | PRN
  Administered 2019-02-24: 200 ug via INTRAVENOUS

## 2019-02-24 MED ORDER — ROCURONIUM BROMIDE 50 MG/5ML IV SOLN
INTRAVENOUS | Status: AC
  Filled 2019-02-24: qty 1

## 2019-02-24 MED ORDER — BUPIVACAINE HCL 0.5 % IJ SOLN
INTRAMUSCULAR | Status: DC | PRN
  Administered 2019-02-24: 18 mL

## 2019-02-24 MED ORDER — EPHEDRINE SULFATE 50 MG/ML IJ SOLN
INTRAMUSCULAR | Status: DC | PRN
  Administered 2019-02-24: 10 mg via INTRAVENOUS

## 2019-02-24 MED ORDER — SUCCINYLCHOLINE CHLORIDE 20 MG/ML IJ SOLN
INTRAMUSCULAR | Status: DC | PRN
  Administered 2019-02-24: 100 mg via INTRAVENOUS

## 2019-02-24 MED ORDER — DEXAMETHASONE SODIUM PHOSPHATE 10 MG/ML IJ SOLN
INTRAMUSCULAR | Status: AC
  Filled 2019-02-24: qty 1

## 2019-02-24 MED ORDER — LIDOCAINE HCL (CARDIAC) PF 100 MG/5ML IV SOSY
PREFILLED_SYRINGE | INTRAVENOUS | Status: DC | PRN
  Administered 2019-02-24: 100 mg via INTRAVENOUS

## 2019-02-24 MED ORDER — ONDANSETRON HCL 4 MG/2ML IJ SOLN
INTRAMUSCULAR | Status: AC
  Filled 2019-02-24: qty 2

## 2019-02-24 MED ORDER — FENTANYL CITRATE (PF) 250 MCG/5ML IJ SOLN
INTRAMUSCULAR | Status: AC
  Filled 2019-02-24: qty 5

## 2019-02-24 MED ORDER — DEXAMETHASONE SODIUM PHOSPHATE 10 MG/ML IJ SOLN
INTRAMUSCULAR | Status: DC | PRN
  Administered 2019-02-24: 10 mg via INTRAVENOUS

## 2019-02-24 MED ORDER — BUPIVACAINE HCL (PF) 0.5 % IJ SOLN
INTRAMUSCULAR | Status: AC
  Filled 2019-02-24: qty 30

## 2019-02-24 MED ORDER — LIDOCAINE HCL 4 % MT SOLN
OROMUCOSAL | Status: DC | PRN
  Administered 2019-02-24: 4 mL via TOPICAL

## 2019-02-24 MED ORDER — ACETAMINOPHEN 10 MG/ML IV SOLN
INTRAVENOUS | Status: AC
  Filled 2019-02-24: qty 100

## 2019-02-24 MED ORDER — MIDAZOLAM HCL 2 MG/2ML IJ SOLN
INTRAMUSCULAR | Status: AC
  Filled 2019-02-24: qty 2

## 2019-02-24 MED ORDER — ONDANSETRON HCL 4 MG/2ML IJ SOLN: 4.0000 mg | Freq: Once | INTRAMUSCULAR | Status: DC | PRN

## 2019-02-24 MED ORDER — ROCURONIUM BROMIDE 100 MG/10ML IV SOLN
INTRAVENOUS | Status: DC | PRN
  Administered 2019-02-24: 45 mg via INTRAVENOUS
  Administered 2019-02-24: 10 mg via INTRAVENOUS
  Administered 2019-02-24: 5 mg via INTRAVENOUS

## 2019-02-24 MED ORDER — FENTANYL CITRATE (PF) 100 MCG/2ML IJ SOLN
INTRAMUSCULAR | Status: DC | PRN
  Administered 2019-02-24: 50 ug via INTRAVENOUS
  Administered 2019-02-24: 100 ug via INTRAVENOUS
  Administered 2019-02-24: 50 ug via INTRAVENOUS

## 2019-02-24 MED ORDER — LACTATED RINGERS IV SOLN
INTRAVENOUS | Status: DC
  Administered 2019-02-24: 13:00:00 via INTRAVENOUS

## 2019-02-24 MED ORDER — KETOROLAC TROMETHAMINE 30 MG/ML IJ SOLN
INTRAMUSCULAR | Status: AC
  Filled 2019-02-24: qty 1

## 2019-02-24 MED ORDER — LIDOCAINE HCL (PF) 2 % IJ SOLN
INTRAMUSCULAR | Status: AC
  Filled 2019-02-24: qty 10

## 2019-02-24 MED ORDER — OXYCODONE-ACETAMINOPHEN 5-325 MG PO TABS: 1.0000 | ORAL_TABLET | ORAL | 0 refills | Status: DC | PRN

## 2019-02-24 MED ORDER — PROPOFOL 10 MG/ML IV BOLUS
INTRAVENOUS | Status: AC
  Filled 2019-02-24: qty 20

## 2019-02-24 MED ORDER — SUCCINYLCHOLINE CHLORIDE 20 MG/ML IJ SOLN
INTRAMUSCULAR | Status: AC
  Filled 2019-02-24: qty 1

## 2019-02-24 MED ORDER — PROPOFOL 10 MG/ML IV BOLUS
INTRAVENOUS | Status: DC | PRN
  Administered 2019-02-24: 200 mg via INTRAVENOUS

## 2019-02-24 MED ORDER — ONDANSETRON HCL 4 MG/2ML IJ SOLN
INTRAMUSCULAR | Status: DC | PRN
  Administered 2019-02-24: 4 mg via INTRAVENOUS

## 2019-02-24 MED ORDER — SUGAMMADEX SODIUM 200 MG/2ML IV SOLN
INTRAVENOUS | Status: AC
  Filled 2019-02-24: qty 2

## 2019-02-24 MED ORDER — KETOROLAC TROMETHAMINE 30 MG/ML IJ SOLN
INTRAMUSCULAR | Status: DC | PRN
  Administered 2019-02-24: 30 mg via INTRAVENOUS

## 2019-02-24 MED ORDER — IBUPROFEN 600 MG PO TABS: 600.0000 mg | ORAL_TABLET | Freq: Four times a day (QID) | ORAL | 3 refills | Status: DC | PRN

## 2019-02-24 MED ORDER — MIDAZOLAM HCL 2 MG/2ML IJ SOLN
INTRAMUSCULAR | Status: DC | PRN
  Administered 2019-02-24: 2 mg via INTRAVENOUS

## 2019-02-24 MED ORDER — ACETAMINOPHEN 10 MG/ML IV SOLN
INTRAVENOUS | Status: DC | PRN
  Administered 2019-02-24: 1000 mg via INTRAVENOUS

## 2019-02-24 SURGICAL SUPPLY — 43 items
ANCHOR TIS RET SYS 235ML (MISCELLANEOUS) IMPLANT
BAG URINE DRAINAGE (UROLOGICAL SUPPLIES) ×2 IMPLANT
BLADE SURG SZ11 CARB STEEL (BLADE) ×2 IMPLANT
CANISTER SUCT 1200ML W/VALVE (MISCELLANEOUS) ×2 IMPLANT
CATH FOLEY 2WAY  5CC 16FR (CATHETERS) ×1
CATH URTH 16FR FL 2W BLN LF (CATHETERS) ×1 IMPLANT
CHLORAPREP W/TINT 26 (MISCELLANEOUS) ×2 IMPLANT
COVER WAND RF STERILE (DRAPES) ×2 IMPLANT
DERMABOND ADVANCED (GAUZE/BANDAGES/DRESSINGS) ×1
DERMABOND ADVANCED .7 DNX12 (GAUZE/BANDAGES/DRESSINGS) ×1 IMPLANT
DRAPE 3/4 80X56 (DRAPES) ×2 IMPLANT
DRAPE UNDER BUTTOCK W/FLU (DRAPES) ×2 IMPLANT
ELECT REM PT RETURN 9FT ADLT (ELECTROSURGICAL) ×2
ELECTRODE REM PT RTRN 9FT ADLT (ELECTROSURGICAL) ×1 IMPLANT
GLOVE BIO SURGEON STRL SZ7 (GLOVE) ×8 IMPLANT
GLOVE INDICATOR 7.5 STRL GRN (GLOVE) ×2 IMPLANT
GOWN STRL REUS W/ TWL LRG LVL3 (GOWN DISPOSABLE) ×2 IMPLANT
GOWN STRL REUS W/TWL LRG LVL3 (GOWN DISPOSABLE) ×2
GRASPER SUT TROCAR 14GX15 (MISCELLANEOUS) ×2 IMPLANT
IRRIGATION STRYKERFLOW (MISCELLANEOUS) IMPLANT
IRRIGATOR STRYKERFLOW (MISCELLANEOUS)
IV LACTATED RINGERS 1000ML (IV SOLUTION) IMPLANT
KIT PINK PAD W/HEAD ARE REST (MISCELLANEOUS) ×2
KIT PINK PAD W/HEAD ARM REST (MISCELLANEOUS) ×1 IMPLANT
KIT TURNOVER CYSTO (KITS) ×2 IMPLANT
LABEL OR SOLS (LABEL) ×2 IMPLANT
NEEDLE HYPO 22GX1.5 SAFETY (NEEDLE) ×2 IMPLANT
NS IRRIG 500ML POUR BTL (IV SOLUTION) ×2 IMPLANT
PACK GYN LAPAROSCOPIC (MISCELLANEOUS) ×2 IMPLANT
PAD OB MATERNITY 4.3X12.25 (PERSONAL CARE ITEMS) ×2 IMPLANT
PAD PREP 24X41 OB/GYN DISP (PERSONAL CARE ITEMS) ×2 IMPLANT
SCISSORS METZENBAUM CVD 33 (INSTRUMENTS) ×2 IMPLANT
SET TUBE SMOKE EVAC HIGH FLOW (TUBING) ×2 IMPLANT
SHEARS HARMONIC ACE PLUS 36CM (ENDOMECHANICALS) IMPLANT
SLEEVE ENDOPATH XCEL 5M (ENDOMECHANICALS) ×4 IMPLANT
SOL PREP PVP 2OZ (MISCELLANEOUS) ×2
SOLUTION PREP PVP 2OZ (MISCELLANEOUS) ×1 IMPLANT
SURGILUBE 2OZ TUBE FLIPTOP (MISCELLANEOUS) ×2 IMPLANT
SUT MNCRL AB 4-0 PS2 18 (SUTURE) IMPLANT
SUT VIC AB 2-0 UR6 27 (SUTURE) IMPLANT
TROCAR ENDO BLADELESS 11MM (ENDOMECHANICALS) IMPLANT
TROCAR XCEL NON-BLD 5MMX100MML (ENDOMECHANICALS) ×2 IMPLANT
TROCAR XCEL UNIV SLVE 11M 100M (ENDOMECHANICALS) IMPLANT

## 2019-02-24 NOTE — Anesthesia Post-op Follow-up Note (Signed)
Anesthesia QCDR form completed.        

## 2019-02-24 NOTE — Anesthesia Postprocedure Evaluation (Signed)
Anesthesia Post Note  Patient: Claudia Byrd  Procedure(s) Performed: LAPAROSCOPIC RIGHT OVARIAN CYSTECTOMY (Right )  Patient location during evaluation: PACU Anesthesia Type: General Level of consciousness: awake and alert and oriented Pain management: pain level controlled Vital Signs Assessment: post-procedure vital signs reviewed and stable Respiratory status: spontaneous breathing Cardiovascular status: blood pressure returned to baseline Anesthetic complications: no     Last Vitals:  Vitals:   02/24/19 1520 02/24/19 1533  BP: 124/69 117/67  Pulse: 77 72  Resp: 16 16  Temp:    SpO2: 97% 100%    Last Pain:  Vitals:   02/24/19 1533  TempSrc:   PainSc: 0-No pain                 Paizley Ramella

## 2019-02-24 NOTE — Op Note (Signed)
Preoperative Diagnosis: 1) 28 y.o. with persist ant right ovarian cyst  Postoperative Diagnosis: 1) 28 y.o. with persist ant right ovarian cyst  Operation Performed: Laparoscopic right ovarian cystectomy  Indication: 28 y.o. G1P1001 with 7cm right ovarian cyst first imaged last year during her pregnancy but recent ER visit for abdominal pain post intercourse showing the same 7cm right ovarian cyst.    Surgeon: [name], MD  Anesthesia: General  Preoperative Antibiotics: none  Estimated Blood Loss: 55mL  IV Fluids: 683mL  Urine Output:: 61mL  Drains or Tubes: none  Implants: none  Specimens Removed: right ovarian cyst wall  Complications: none  Intraoperative Findings: Normal tubes,and uterus.  Right ovary markedly enlarged with a large simple cyst containing serous fluid.  Left ovary with some filmy adhesive disease.  Evidence of small involuting corpus luteum/hemorrhagic cyst.  Normal liver edge, normal anterior posterior cul de sacs, normal appendix  Patient Condition: stable  Procedure in Detail:  Patient was taken to the operating room where she was administered general anesthesia.  She was positioned in the dorsal lithotomy position utilizing Allen stirups, prepped and draped in the usual sterile fashion.  Prior to proceeding with procedure a time out was performed.  Attention was turned to the patient's pelvis.  An indwelling foley catheter was used to empty the patient's bladder.  An operative speculum was placed to allow visualization of the cervix.  The anterior lip of the cervix was grasped with a single tooth tenaculum, and a Hulka tenaculum was placed to allow manipulation of the uterus.  The operative speculum and single tooth tenaculum were then removed.  Attention was turned to the patient's abdomen.  The umbilicus was infiltrated with 1% Sensorcaine, before making a stab incision using an 11 blade scalpel.  A 11mm Excel trocar was then used to gain direct  entry into the peritoneal cavity utilizing the camera to visualize progress of the trocar during placement.  Once peritoneal entry had been achieved, insufflation was started and pneumoperitoneum established at a pressure of 39mmHg.   General inspection of the abdomen revealed the above noted findings. Two additional 21mm excel trocars were placed in the left lower quadrant and left upper quadrant under direct visualization.  The cyst was stabilized, the ovarian cortex was scored using a monopolar scissors.  The cyst wall was then resected off the ovarian cortex using a Marilyn grasper to undermine.  The cyst eventually ruptured noting clear serous fluid.  The cyst wall pealed out easily with a good interface between the cyst and normal ovarian tissue.  The ovary was hemostatic following removal of the cyst wall.  The pelvis was irrigated.  Pneumoperitoneum was evacuated.  The trocars were removed.   All trocar sites were then dressed with surgical skin glue.  The Hulka tenaculum was removed.  Sponge needle and instrument counts were correct time two.  The patient tolerated the procedure well and was taken to the recovery room in stable condition.

## 2019-02-24 NOTE — Discharge Instructions (Signed)

## 2019-02-24 NOTE — Transfer of Care (Signed)
Immediate Anesthesia Transfer of Care Note  Patient: Claudia Byrd  Procedure(s) Performed: LAPAROSCOPIC RIGHT OVARIAN CYSTECTOMY (Right )  Patient Location: PACU  Anesthesia Type:General  Level of Consciousness: awake, alert  and oriented  Airway & Oxygen Therapy: Patient Spontanous Breathing and Patient connected to face mask oxygen  Post-op Assessment: Report given to RN and Post -op Vital signs reviewed and stable  Post vital signs: Reviewed and stable  Last Vitals:  Vitals Value Taken Time  BP 136/85 02/24/19 1439  Temp 36.9 C 02/24/19 1439  Pulse 83 02/24/19 1439  Resp 17 02/24/19 1439  SpO2 100 % 02/24/19 1439  Vitals shown include unvalidated device data.  Last Pain:  Vitals:   02/24/19 1204  TempSrc: Temporal  PainSc: 0-No pain         Complications: No apparent anesthesia complications

## 2019-02-24 NOTE — Anesthesia Procedure Notes (Signed)
Procedure Name: Intubation Date/Time: 02/24/2019 1:16 PM Performed by: Eben Burow, CRNA Pre-anesthesia Checklist: Patient identified, Emergency Drugs available, Suction available and Patient being monitored Patient Re-evaluated:Patient Re-evaluated prior to induction Oxygen Delivery Method: Circle system utilized Preoxygenation: Pre-oxygenation with 100% oxygen Induction Type: IV induction Ventilation: Mask ventilation without difficulty Laryngoscope Size: Miller and 2 Grade View: Grade I Tube type: Oral Tube size: 7.0 mm Number of attempts: 1 Airway Equipment and Method: Oral airway and LTA kit utilized Placement Confirmation: ETT inserted through vocal cords under direct vision,  positive ETCO2 and breath sounds checked- equal and bilateral Secured at: 21 cm Tube secured with: Tape Dental Injury: Teeth and Oropharynx as per pre-operative assessment

## 2019-02-24 NOTE — Anesthesia Preprocedure Evaluation (Signed)
Anesthesia Evaluation  Patient identified by MRN, date of birth, ID band Patient awake    Reviewed: Allergy & Precautions, NPO status , Patient's Chart, lab work & pertinent test results  Airway Mallampati: II  TM Distance: >3 FB     Dental   Pulmonary neg pulmonary ROS,    Pulmonary exam normal        Cardiovascular hypertension, Normal cardiovascular exam     Neuro/Psych negative neurological ROS  negative psych ROS   GI/Hepatic Neg liver ROS, GERD  ,  Endo/Other  Morbid obesity  Renal/GU negative Renal ROS  negative genitourinary   Musculoskeletal negative musculoskeletal ROS (+)   Abdominal Normal abdominal exam  (+)   Peds negative pediatric ROS (+)  Hematology negative hematology ROS (+)   Anesthesia Other Findings   Reproductive/Obstetrics                             Anesthesia Physical  Anesthesia Plan  ASA: II  Anesthesia Plan: General   Post-op Pain Management:    Induction: Intravenous  PONV Risk Score and Plan:   Airway Management Planned: Oral ETT  Additional Equipment:   Intra-op Plan:   Post-operative Plan: Extubation in OR  Informed Consent: I have reviewed the patients History and Physical, chart, labs and discussed the procedure including the risks, benefits and alternatives for the proposed anesthesia with the patient or authorized representative who has indicated his/her understanding and acceptance.     Dental advisory given  Plan Discussed with: CRNA and Surgeon  Anesthesia Plan Comments:         Anesthesia Quick Evaluation

## 2019-02-24 NOTE — H&P (Signed)
Date of Initial H&P: 02/16/2019  History reviewed, patient examined, no change in status, stable for surgery.

## 2019-03-01 LAB — SURGICAL PATHOLOGY

## 2019-03-04 ENCOUNTER — Encounter: Payer: Self-pay | Admitting: Obstetrics and Gynecology

## 2019-03-04 ENCOUNTER — Ambulatory Visit (INDEPENDENT_AMBULATORY_CARE_PROVIDER_SITE_OTHER): Payer: BC Managed Care – PPO | Admitting: Obstetrics and Gynecology

## 2019-03-04 ENCOUNTER — Other Ambulatory Visit: Payer: Self-pay

## 2019-03-04 VITALS — BP 118/82 | HR 85 | Ht 69.0 in | Wt 281.0 lb

## 2019-03-04 DIAGNOSIS — Z23 Encounter for immunization: Secondary | ICD-10-CM | POA: Diagnosis not present

## 2019-03-04 DIAGNOSIS — Z9889 Other specified postprocedural states: Secondary | ICD-10-CM

## 2019-03-04 DIAGNOSIS — Z3041 Encounter for surveillance of contraceptive pills: Secondary | ICD-10-CM

## 2019-03-04 MED ORDER — NORGESTIMATE-ETH ESTRADIOL 0.25-35 MG-MCG PO TABS: 1.0000 | ORAL_TABLET | Freq: Every day | ORAL | 11 refills | Status: DC

## 2019-03-04 NOTE — Progress Notes (Signed)
      Postoperative Follow-up Patient presents post op from laparoscopic left ovarian cystectomy 1weeks ago for serous cystadenoma.  Subjective: Patient reports marked improvement in her preop symptoms. Eating a regular diet without difficulty. The patient is not having any pain.  Activity: normal activities of daily living.  Objective: Blood pressure 118/82, pulse 85, height 5\' 9"  (1.753 m), weight 281 lb (127.5 kg), last menstrual period 02/19/2019, not currently breastfeeding.  General: NAD Pulmonary: no increased work of breathing Abdomen: soft, non-tender, non-distended, incision(s) D/C/I Extremities: no edema Neurologic: normal gait    Admission on 02/24/2019, Discharged on 02/24/2019  Component Date Value Ref Range Status  . ABO/RH(D) 02/24/2019 O POS   Final  . Antibody Screen 02/24/2019 NEG   Final  . Sample Expiration 02/24/2019    Final                   Value:02/27/2019,2359 Performed at [facility], [address] [place], [place] [address]   . Preg Test, Ur 02/24/2019 NEGATIVE  NEGATIVE Final   Comment:        THE SENSITIVITY OF THIS METHODOLOGY IS >24 mIU/mL   . SURGICAL PATHOLOGY 02/24/2019    Final                   Value:Surgical Pathology CASE: [id number] PATIENT: Princess Bruins Surgical Pathology Report     SPECIMEN SUBMITTED: A. Ovarian cyst, right  CLINICAL HISTORY: None provided  PRE-OPERATIVE DIAGNOSIS: [id number] right ovarian cyst  POST-OPERATIVE DIAGNOSIS: Same as pre-op    DIAGNOSIS: A. OVARY, RIGHT; CYSTECTOMY: - FRAGMENTS OF BENIGN OVARIAN CYST WALL, COMPATIBLE WITH SEROUS CYSTADENOMA. - NEGATIVE FOR ATYPIA AND MALIGNANCY.  GROSS DESCRIPTION: A. Labeled: Right ovarian cyst wall Received: In formalin Tissue fragment(s): 1 Size: 2.5 x 2.0 x 0.3 cm Description: Unoriented grossly unremarkable fibromembranous tissue fragment Entirely submitted in 1 cassette.   Final Diagnosis performed by Allena Napoleon, MD.    Electronically signed 03/01/2019 1:36:18PM The electronic signature indicates that the named Attending Pathologist has evaluated the specimen  Technical component performed at [facility], [address], [place], [place] [address] Lab: [phone] Dir: Worthy Rancher, MD, MMM  Professional component performed at [facility], [facility], [place], [place], [place] [address] Lab: [phone] Dir: Dellia Nims. Reuel Derby, MD     Assessment: 28 y.o. s/p laparoscopic left ovarian cystectomy stable  Plan: Patient has done well after surgery with no apparent complications.  I have discussed the post-operative course to date, and the expected progress moving forward.  The patient understands what complications to be concerned about.  I will see the patient in routine follow up, or sooner if needed.    Activity plan: No restriction.  Switch to 72mcg OCP as inconsistent withdrawal bleeds on 48mcg pill   [name], MD, [place] 03/04/2019, 6:21 PM

## 2019-04-28 ENCOUNTER — Other Ambulatory Visit: Payer: Self-pay | Admitting: Obstetrics and Gynecology

## 2019-04-28 DIAGNOSIS — Z800 Family history of malignant neoplasm of digestive organs: Secondary | ICD-10-CM

## 2019-04-28 NOTE — Progress Notes (Signed)
Sister recent diagnosis of colon cancer at age 29.

## 2019-05-02 ENCOUNTER — Telehealth: Payer: Self-pay

## 2019-05-02 NOTE — Telephone Encounter (Signed)
Returned patients call.  LVM for pt to call office back in regards to scheduling her colonoscopy.  Thanks  [employer]

## 2019-05-03 ENCOUNTER — Other Ambulatory Visit: Payer: Self-pay

## 2019-05-03 ENCOUNTER — Telehealth: Payer: Self-pay

## 2019-05-03 DIAGNOSIS — Z800 Family history of malignant neoplasm of digestive organs: Secondary | ICD-10-CM

## 2019-05-03 DIAGNOSIS — Z1211 Encounter for screening for malignant neoplasm of colon: Secondary | ICD-10-CM

## 2019-05-03 MED ORDER — NA SULFATE-K SULFATE-MG SULF 17.5-3.13-1.6 GM/177ML PO SOLN: 1.0000 | Freq: Once | ORAL | 0 refills | Status: AC

## 2019-05-03 NOTE — Telephone Encounter (Signed)
Gastroenterology Pre-Procedure Review  Request Date: Friday 05/27/19 Requesting Physician: Dr. Allen Norris  PATIENT REVIEW QUESTIONS: The patient responded to the following health history questions as indicated:    1. Are you having any GI issues? no 2. Do you have a personal history of Polyps? no 3. Do you have a family history of Colon Cancer or Polyps? yes (sister colon cancer) 4. Diabetes Mellitus? no 5. Joint replacements in the past 12 months?no 6. Major health problems in the past 3 months?no 7. Any artificial heart valves, MVP, or defibrillator?no    MEDICATIONS & ALLERGIES:    Patient reports the following regarding taking any anticoagulation/antiplatelet therapy:   Plavix, Coumadin, Eliquis, Xarelto, Lovenox, Pradaxa, Brilinta, or Effient? no Aspirin? no  Patient confirms/reports the following medications:  Current Outpatient Medications  Medication Sig Dispense Refill  . docusate sodium (COLACE) 100 MG capsule Take 100 mg by mouth daily as needed for mild constipation.    Marland Kitchen ibuprofen (ADVIL) 600 MG tablet Take 1 tablet (600 mg total) by mouth every 6 (six) hours as needed. (Patient not taking: Reported on 03/04/2019) 60 tablet 3  . loratadine (CLARITIN) 10 MG tablet Take 10 mg by mouth daily.    . norgestimate-ethinyl estradiol (ORTHO-CYCLEN) 0.25-35 MG-MCG tablet Take 1 tablet by mouth daily. 1 Package 11  . omeprazole (PRILOSEC OTC) 20 MG tablet Take 20 mg by mouth daily as needed (acid reflux).    Marland Kitchen oxyCODONE-acetaminophen (PERCOCET) 5-325 MG tablet Take 1 tablet by mouth every 4 (four) hours as needed for severe pain. (Patient not taking: Reported on 03/04/2019) 20 tablet 0   No current facility-administered medications for this visit.     Patient confirms/reports the following allergies:  Allergies  Allergen Reactions  . Penicillins Itching    Did it involve swelling of the face/tongue/throat, SOB, or low BP? No Did it involve sudden or severe rash/hives, skin peeling, or  any reaction on the inside of your mouth or nose? No Did you need to seek medical attention at a hospital or doctor's office? No When did it last happen?may 2020 If all above answers are "NO", may proceed with cephalosporin use.   . Sulfa Antibiotics     Unknown, childhood allergy    No orders of the defined types were placed in this encounter.   AUTHORIZATION INFORMATION Primary Insurance: 1D#: Group #:  Secondary Insurance: 1D#: Group #:  SCHEDULE INFORMATION: Date: Friday 05/27/19 Time: Location:[facility]

## 2019-05-24 ENCOUNTER — Other Ambulatory Visit
Admission: RE | Admit: 2019-05-24 | Discharge: 2019-05-24 | Disposition: A | Discharge status: Home / Self Care | Payer: BC Managed Care – PPO | Source: Ambulatory Visit | Attending: Gastroenterology | Admitting: Gastroenterology

## 2019-05-24 DIAGNOSIS — Z01812 Encounter for preprocedural laboratory examination: Secondary | ICD-10-CM | POA: Diagnosis present

## 2019-05-24 DIAGNOSIS — Z20828 Contact with and (suspected) exposure to other viral communicable diseases: Secondary | ICD-10-CM | POA: Insufficient documentation

## 2019-05-24 LAB — SARS CORONAVIRUS 2 (TAT 6-24 HRS): SARS Coronavirus 2: NEGATIVE

## 2019-05-27 ENCOUNTER — Other Ambulatory Visit: Payer: Self-pay

## 2019-05-27 ENCOUNTER — Ambulatory Visit
Admission: RE | Admit: 2019-05-27 | Discharge: 2019-05-27 | Disposition: A | Discharge status: Home / Self Care | Payer: BC Managed Care – PPO | Attending: Gastroenterology | Admitting: Gastroenterology

## 2019-05-27 ENCOUNTER — Encounter: Payer: Self-pay | Admitting: *Deleted

## 2019-05-27 ENCOUNTER — Encounter
Admission: RE | Disposition: A | Discharge status: Home / Self Care | Payer: Self-pay | Source: Home / Self Care | Attending: Gastroenterology

## 2019-05-27 ENCOUNTER — Ambulatory Visit: Payer: BC Managed Care – PPO | Admitting: Registered Nurse

## 2019-05-27 DIAGNOSIS — Z79899 Other long term (current) drug therapy: Secondary | ICD-10-CM | POA: Insufficient documentation

## 2019-05-27 DIAGNOSIS — Z800 Family history of malignant neoplasm of digestive organs: Secondary | ICD-10-CM | POA: Diagnosis not present

## 2019-05-27 DIAGNOSIS — K219 Gastro-esophageal reflux disease without esophagitis: Secondary | ICD-10-CM | POA: Insufficient documentation

## 2019-05-27 DIAGNOSIS — Z793 Long term (current) use of hormonal contraceptives: Secondary | ICD-10-CM | POA: Diagnosis not present

## 2019-05-27 DIAGNOSIS — Z1211 Encounter for screening for malignant neoplasm of colon: Secondary | ICD-10-CM

## 2019-05-27 HISTORY — PX: COLONOSCOPY WITH PROPOFOL: SHX5780

## 2019-05-27 LAB — POCT PREGNANCY, URINE: Preg Test, Ur: NEGATIVE

## 2019-05-27 SURGERY — COLONOSCOPY WITH PROPOFOL
Anesthesia: General

## 2019-05-27 MED ORDER — PROPOFOL 500 MG/50ML IV EMUL
INTRAVENOUS | Status: DC | PRN
  Administered 2019-05-27: 150 ug/kg/min via INTRAVENOUS

## 2019-05-27 MED ORDER — LIDOCAINE HCL (CARDIAC) PF 100 MG/5ML IV SOSY
PREFILLED_SYRINGE | INTRAVENOUS | Status: DC | PRN
  Administered 2019-05-27: 40 mg via INTRAVENOUS

## 2019-05-27 MED ORDER — PROPOFOL 500 MG/50ML IV EMUL
INTRAVENOUS | Status: AC
  Filled 2019-05-27: qty 50

## 2019-05-27 MED ORDER — LIDOCAINE HCL (PF) 2 % IJ SOLN
INTRAMUSCULAR | Status: AC
  Filled 2019-05-27: qty 10

## 2019-05-27 MED ORDER — SUCCINYLCHOLINE CHLORIDE 20 MG/ML IJ SOLN
INTRAMUSCULAR | Status: AC
  Filled 2019-05-27: qty 1

## 2019-05-27 MED ORDER — PROPOFOL 10 MG/ML IV BOLUS
INTRAVENOUS | Status: DC | PRN
  Administered 2019-05-27: 10 mg via INTRAVENOUS

## 2019-05-27 MED ORDER — MIDAZOLAM HCL 2 MG/2ML IJ SOLN
INTRAMUSCULAR | Status: AC
  Filled 2019-05-27: qty 2

## 2019-05-27 MED ORDER — MIDAZOLAM HCL 2 MG/2ML IJ SOLN
INTRAMUSCULAR | Status: DC | PRN
  Administered 2019-05-27: 2 mg via INTRAVENOUS

## 2019-05-27 MED ORDER — SODIUM CHLORIDE 0.9 % IV SOLN
INTRAVENOUS | Status: DC
  Administered 2019-05-27: 1000 mL via INTRAVENOUS

## 2019-05-27 NOTE — Anesthesia Procedure Notes (Signed)
Date/Time: 05/27/2019 8:31 AM Performed by: Doreen Salvage, CRNA Pre-anesthesia Checklist: Patient identified, Emergency Drugs available, Suction available and Patient being monitored Patient Re-evaluated:Patient Re-evaluated prior to induction Oxygen Delivery Method: Nasal cannula Induction Type: IV induction Dental Injury: Teeth and Oropharynx as per pre-operative assessment  Comments: Nasal cannula with etCO2 monitoring

## 2019-05-27 NOTE — Op Note (Signed)
[facility] Gastroenterology Patient Name: Claudia Byrd Procedure Date: 05/27/2019 8:23 AM MRN: [id number] Account #: [id number] Date of Birth: 07-30-1990 Admit Type: Outpatient Age: 28 Room: [facility] ENDO ROOM 4 Gender: Female Note Status: Finalized Procedure:             Colonoscopy Indications:           Family history of colon cancer in a first-degree                         relative before age 48 years Providers:             Lucilla Lame MD, MD Referring MD:          No Local Md, MD (Referring MD) Medicines:             Propofol per Anesthesia Complications:         No immediate complications. Procedure:             Pre-Anesthesia Assessment:                        - Prior to the procedure, a History and Physical was                         performed, and patient medications and allergies were                         reviewed. The patient's tolerance of previous                         anesthesia was also reviewed. The risks and benefits                         of the procedure and the sedation options and risks                         were discussed with the patient. All questions were                         answered, and informed consent was obtained. Prior                         Anticoagulants: The patient has taken no previous                         anticoagulant or antiplatelet agents. ASA Grade                         Assessment: III - A patient with severe systemic                         disease. After reviewing the risks and benefits, the                         patient was deemed in satisfactory condition to                         undergo the procedure.  After obtaining informed consent, the colonoscope was                         passed under direct vision. Throughout the procedure,                         the patient's blood pressure, pulse, and oxygen                         saturations were monitored continuously. The                   Colonoscope was introduced through the anus and                         advanced to the the cecum, identified by appendiceal                         orifice and ileocecal valve. The colonoscopy was                         performed without difficulty. The patient tolerated                         the procedure well. The quality of the bowel                         preparation was excellent. Findings:      The perianal and digital rectal examinations were normal.      The entire examined colon appeared normal. Impression:            - The entire examined colon is normal.                        - No specimens collected. Recommendation:        - Discharge patient to home.                        - Resume previous diet.                        - Continue present medications.                        - Repeat colonoscopy in 5 years for surveillance. Procedure Code(s):     --- Professional ---                        [phone], Colonoscopy, flexible; diagnostic, including                         collection of specimen(s) by brushing or washing, when                         performed (separate procedure) Diagnosis Code(s):     --- Professional ---                        Z80.0, Family history of malignant neoplasm of  digestive organs CPT copyright 2019 American Medical Association. All rights reserved. The codes documented in this report are preliminary and upon coder review may  be revised to meet current compliance requirements. Lucilla Lame MD, MD 05/27/2019 8:43:39 AM This report has been signed electronically. Number of Addenda: 0 Note Initiated On: 05/27/2019 8:23 AM Scope Withdrawal Time: 0 hours 6 minutes 7 seconds  Total Procedure Duration: 0 hours 8 minutes 58 seconds  Estimated Blood Loss:  Estimated blood loss: none.      [facility]

## 2019-05-27 NOTE — Anesthesia Preprocedure Evaluation (Signed)
Anesthesia Evaluation  Patient identified by MRN, date of birth, ID band Patient awake    Reviewed: Allergy & Precautions, H&P , NPO status , Patient's Chart, lab work & pertinent test results  History of Anesthesia Complications Negative for: history of anesthetic complications  Airway Mallampati: III  TM Distance: >3 FB Neck ROM: full    Dental  (+) Chipped   Pulmonary neg pulmonary ROS,           Cardiovascular Exercise Tolerance: Good hypertension, (-) angina(-) Past MI and (-) DOE      Neuro/Psych negative neurological ROS  negative psych ROS   GI/Hepatic Neg liver ROS, GERD  Medicated and Controlled,  Endo/Other  negative endocrine ROS  Renal/GU negative Renal ROS  negative genitourinary   Musculoskeletal   Abdominal   Peds  Hematology negative hematology ROS (+)   Anesthesia Other Findings Past Medical History: No date: GERD (gastroesophageal reflux disease) No date: Hypertension     Comment:  no longer hypertensive  Past Surgical History: No date: DIAGNOSTIC LAPAROSCOPY 02/24/2019: LAPAROSCOPIC OVARIAN CYSTECTOMY; Right     Comment:  Procedure: LAPAROSCOPIC RIGHT OVARIAN CYSTECTOMY;                Surgeon: [name], MD;  Location: [facility] ORS;                Service: Gynecology;  Laterality: Right; No date: TONSILLECTOMY  BMI    Body Mass Index: 39.87 kg/m      Reproductive/Obstetrics negative OB ROS                             Anesthesia Physical Anesthesia Plan  ASA: III  Anesthesia Plan: General   Post-op Pain Management:    Induction: Intravenous  PONV Risk Score and Plan: Propofol infusion and TIVA  Airway Management Planned: Natural Airway and Nasal Cannula  Additional Equipment:   Intra-op Plan:   Post-operative Plan:   Informed Consent: I have reviewed the patients History and Physical, chart, labs and discussed the procedure including the  risks, benefits and alternatives for the proposed anesthesia with the patient or authorized representative who has indicated his/her understanding and acceptance.     Dental Advisory Given  Plan Discussed with: Anesthesiologist, CRNA and Surgeon  Anesthesia Plan Comments: (Patient consented for risks of anesthesia including but not limited to:  - adverse reactions to medications - risk of intubation if required - damage to teeth, lips or other oral mucosa - sore throat or hoarseness - Damage to heart, brain, lungs or loss of life  Patient voiced understanding.)        Anesthesia Quick Evaluation

## 2019-05-27 NOTE — Anesthesia Postprocedure Evaluation (Signed)
Anesthesia Post Note  Patient: Claudia Byrd  Procedure(s) Performed: COLONOSCOPY WITH PROPOFOL (N/A )  Patient location during evaluation: Endoscopy Anesthesia Type: General Level of consciousness: awake and alert Pain management: pain level controlled Vital Signs Assessment: post-procedure vital signs reviewed and stable Respiratory status: spontaneous breathing, nonlabored ventilation, respiratory function stable and patient connected to nasal cannula oxygen Cardiovascular status: blood pressure returned to baseline and stable Postop Assessment: no apparent nausea or vomiting Anesthetic complications: no     Last Vitals:  Vitals:   05/27/19 0906 05/27/19 0916  BP: (!) 107/58 111/65  Pulse: 78 82  Resp: 14 18  Temp:    SpO2: 100% 100%    Last Pain:  Vitals:   05/27/19 0916  TempSrc:   PainSc: 0-No pain                 Precious Haws Piscitello

## 2019-05-27 NOTE — Anesthesia Post-op Follow-up Note (Signed)
Anesthesia QCDR form completed.        

## 2019-05-27 NOTE — H&P (Signed)
Lucilla Lame, MD [facility] [address] [place], [place] [address] Phone:[phone] Fax : [phone]  Primary Care Physician:  Almetta Lovely, FNP Primary Gastroenterologist:  Dr. Allen Norris  Pre-Procedure History & Physical: HPI:  Claudia Byrd is a 28 y.o. female is here for an colonoscopy.   Past Medical History:  Diagnosis Date  . GERD (gastroesophageal reflux disease)   . Hypertension    no longer hypertensive    Past Surgical History:  Procedure Laterality Date  . DIAGNOSTIC LAPAROSCOPY    . LAPAROSCOPIC OVARIAN CYSTECTOMY Right 02/24/2019   Procedure: LAPAROSCOPIC RIGHT OVARIAN CYSTECTOMY;  Surgeon: [name], MD;  Location: [facility] ORS;  Service: Gynecology;  Laterality: Right;  . TONSILLECTOMY      Prior to Admission medications   Medication Sig Start Date End Date Taking? Authorizing Provider  docusate sodium (COLACE) 100 MG capsule Take 100 mg by mouth daily as needed for mild constipation.   Yes [provider]  ibuprofen (ADVIL) 600 MG tablet Take 1 tablet (600 mg total) by mouth every 6 (six) hours as needed. 02/24/19  Yes [name], MD  loratadine (CLARITIN) 10 MG tablet Take 10 mg by mouth daily.   Yes [provider]  norgestimate-ethinyl estradiol (ORTHO-CYCLEN) 0.25-35 MG-MCG tablet Take 1 tablet by mouth daily. 03/04/19  Yes [name], MD  omeprazole (PRILOSEC OTC) 20 MG tablet Take 20 mg by mouth daily as needed (acid reflux).   Yes [provider]  oxyCODONE-acetaminophen (PERCOCET) 5-325 MG tablet Take 1 tablet by mouth every 4 (four) hours as needed for severe pain. Patient not taking: Reported on 03/04/2019 02/24/19 02/24/20  [name], MD    Allergies as of 05/04/2019 - Review Complete 03/04/2019  Allergen Reaction Noted  . Penicillins Itching 01/09/2013  . Sulfa antibiotics  09/30/2011    History reviewed. No pertinent family history.  Social History   Socioeconomic History  . Marital  status: Married    Spouse name: Not on file  . Number of children: Not on file  . Years of education: Not on file  . Highest education level: Not on file  Occupational History  . Not on file  Social Needs  . Financial resource strain: Not on file  . Food insecurity    Worry: Not on file    Inability: Not on file  . Transportation needs    Medical: Not on file    Non-medical: Not on file  Tobacco Use  . Smoking status: Never Smoker  . Smokeless tobacco: Never Used  Substance and Sexual Activity  . Alcohol use: Never    Frequency: Never  . Drug use: Never  . Sexual activity: Yes    Partners: Male    Birth control/protection: Pill  Lifestyle  . Physical activity    Days per week: Not on file    Minutes per session: Not on file  . Stress: Not on file  Relationships  . Social Herbalist on phone: Not on file    Gets together: Not on file    Attends religious service: Not on file    Active member of club or organization: Not on file    Attends meetings of clubs or organizations: Not on file    Relationship status: Not on file  . Intimate partner violence    Fear of current or ex partner: Not on file    Emotionally abused: Not on file    Physically abused: Not on file  Forced sexual activity: Not on file  Other Topics Concern  . Not on file  Social History Narrative  . Not on file    Review of Systems: See HPI, otherwise negative ROS  Physical Exam: BP 140/89   Pulse (!) 102   Temp (!) 96.5 F (35.8 C) (Tympanic)   Resp 18   Ht 5\' 9"  (1.753 m)   Wt 122.5 kg   LMP 05/23/2019 Comment: PREG.TEST NEGATIVE  SpO2 98%   BMI 39.87 kg/m  General:   Alert,  pleasant and cooperative in NAD Head:  Normocephalic and atraumatic. Neck:  Supple; no masses or thyromegaly. Lungs:  Clear throughout to auscultation.    Heart:  Regular rate and rhythm. Abdomen:  Soft, nontender and nondistended. Normal bowel sounds, without guarding, and without rebound.    Neurologic:  Alert and  oriented x4;  grossly normal neurologically.  Impression/Plan: Claudia Byrd is here for an colonoscopy to be performed for family history of colon cancer in 56 year old sister.  Risks, benefits, limitations, and alternatives regarding  colonoscopy have been reviewed with the patient.  Questions have been answered.  All parties agreeable.   Lucilla Lame, MD  05/27/2019, 8:19 AM

## 2019-05-27 NOTE — Transfer of Care (Signed)
Immediate Anesthesia Transfer of Care Note  Patient: Claudia Byrd  Procedure(s) Performed: Procedure(s): COLONOSCOPY WITH PROPOFOL (N/A)  Patient Location: PACU and Endoscopy Unit  Anesthesia Type:General  Level of Consciousness: sedated  Airway & Oxygen Therapy: Patient Spontanous Breathing and Patient connected to nasal cannula oxygen  Post-op Assessment: Report given to RN and Post -op Vital signs reviewed and stable  Post vital signs: Reviewed and stable  Last Vitals:  Vitals:   05/27/19 0734 05/27/19 0846  BP: 140/89 101/62  Pulse: (!) 102 94  Resp: 18 15  Temp: (!) 35.8 C 36.4 C  SpO2: [id number]    Complications: No apparent anesthesia complications

## 2019-05-28 NOTE — Progress Notes (Signed)
Voicemail. No message left.

## 2019-05-30 ENCOUNTER — Encounter: Payer: Self-pay | Admitting: Gastroenterology

## 2019-07-01 DIAGNOSIS — E8881 Metabolic syndrome: Secondary | ICD-10-CM | POA: Insufficient documentation

## 2019-07-01 DIAGNOSIS — E559 Vitamin D deficiency, unspecified: Secondary | ICD-10-CM | POA: Insufficient documentation

## 2019-12-13 ENCOUNTER — Other Ambulatory Visit: Payer: Self-pay

## 2019-12-13 ENCOUNTER — Encounter: Payer: Self-pay | Admitting: Obstetrics and Gynecology

## 2019-12-13 ENCOUNTER — Ambulatory Visit (INDEPENDENT_AMBULATORY_CARE_PROVIDER_SITE_OTHER): Payer: BC Managed Care – PPO | Admitting: Obstetrics and Gynecology

## 2019-12-13 VITALS — BP 134/84 | HR 93 | Ht 69.0 in | Wt 263.0 lb

## 2019-12-13 DIAGNOSIS — Z01419 Encounter for gynecological examination (general) (routine) without abnormal findings: Secondary | ICD-10-CM | POA: Diagnosis not present

## 2019-12-13 DIAGNOSIS — Z3041 Encounter for surveillance of contraceptive pills: Secondary | ICD-10-CM

## 2019-12-13 DIAGNOSIS — Z1239 Encounter for other screening for malignant neoplasm of breast: Secondary | ICD-10-CM | POA: Diagnosis not present

## 2019-12-13 MED ORDER — NORGESTIMATE-ETH ESTRADIOL 0.25-35 MG-MCG PO TABS: 1.0000 | ORAL_TABLET | Freq: Every day | ORAL | 3 refills | Status: DC

## 2019-12-13 NOTE — Progress Notes (Signed)
Gynecology Annual Exam   PCP: Almetta Lovely, FNP  Chief Complaint:  Chief Complaint  Patient presents with  . Gynecologic Exam    History of Present Illness: Patient is a 29 y.o. G1P1001 presents for annual exam. The patient has no complaints today.   LMP: Patient's last menstrual period was 12/05/2019 (exact date). Average Interval: regular, 28 days Duration of flow: 5 days Heavy Menses: no Clots: no Intermenstrual Bleeding: no Postcoital Bleeding: no Dysmenorrhea: no  The patient is sexually active. She currently uses OCP (estrogen/progesterone) for contraception. She denies dyspareunia.  The patient does perform self breast exams.  There is no notable family history of breast or ovarian cancer in her family.  The patient wears seatbelts: yes.   The patient has regular exercise: not asked.    The patient denies current symptoms of depression.  Stable on current dose of Lexapro.  Review of Systems: Review of Systems  Constitutional: Negative for chills and fever.  HENT: Negative for congestion.   Respiratory: Negative for cough and shortness of breath.   Cardiovascular: Negative for chest pain and palpitations.  Gastrointestinal: Negative for abdominal pain, constipation, diarrhea, heartburn, nausea and vomiting.  Genitourinary: Negative for dysuria, frequency and urgency.  Skin: Negative for itching and rash.  Neurological: Negative for dizziness and headaches.  Endo/Heme/Allergies: Negative for polydipsia.  Psychiatric/Behavioral: Negative for depression.    Past Medical History:  Patient Active Problem List   Diagnosis Date Noted  . Family history of colon cancer   . Postpartum care following vaginal delivery 11/01/2018  . Encounter for elective induction of labor 10/31/2018  . Supervision of high risk pregnancy, antepartum 03/10/2018    Clinic [facility] Prenatal Labs  Dating LMP = 7 week [place] Blood type: O/Positive/-- (09/18 1153)   Genetic Screen  NIPS:Normal XX Inheritest: SMA, CF, Fragile-X negative Antibody:Negative (09/18 1153)  Anatomic [place] Incomplete 4-chamber and outflow tracts [x]  Complete 24 weeks Rubella: 5.81 (09/18 1153)  Varicella: Immune  GTT Early:111    Third trimester: 102 RPR: Non Reactive (09/18 1153)   Rhogam N/A HBsAg: Negative (09/18 1153)   TDaP vaccine   08/31/2018 Flu Shot: 03/19/2018 HIV: Non Reactive (09/18 1153)   Baby Food  Breast                               GBS:   Contraception  POP Pap: 03/10/18 NIL  CBB     CS/VBAC N/A  [ ]  anesthesia referral  Support Person Husband [name]        . Chronic hypertension during pregnancy, antepartum 03/10/2018    [X]  Aspirin 81 mg daily after 12 weeks; discontinue after 36 weeks [X]  baseline labs with CBC, CMP, urine protein/creatinine ratio [X]  no BP meds unless BPs become elevated [X]  ultrasound for growth at 28, 32, 36 weeks - 10/18/2018 [redacted]w[redacted]d 7lbs 10oz (3458g) c/w 71.2%ile - 10/11/2018 [redacted]w[redacted]d 7lbs 0oz (3186g) c/w 64.6%ile - 08/31/2018 [redacted]w[redacted]d 4lbs 1oz (1,840g) c/w 62.6%ile   Current antihypertensives:  None   Baseline and surveillance labs (pulled in from [facility], refresh links as needed)  Lab Results  Component Value Date   PLT 379 03/10/2018   CREATININE 0.70 03/10/2018   AST 18 03/10/2018   ALT 10 03/10/2018    Antenatal Testing CHTN - O10.919  Group I  BP < 140/90, no preeclampsia, AGA,  nml AFV, +/- meds    Group II BP > 140/90, on meds, no  preeclampsia, AGA, nml AFV  20-28-34-38  20-24-28-32-35-38  32//2 x wk  28//BPP wkly then 32//2 x wk  40 no meds; 39 meds  PRN or 37  Pre-eclampsia  GHTN - O13.9/Preeclampsia without severe features  - O14.00   Preeclampsia with severe features - O14.10  Q 3-4wks  Q 2 wks  28//BPP wkly then 32//2 x wk  Inpatient  37  PRN or 34     . Obesity affecting pregnancy 03/10/2018    Past Surgical History:  Past Surgical History:  Procedure Laterality Date  . COLONOSCOPY WITH PROPOFOL N/A 05/27/2019    Procedure: COLONOSCOPY WITH PROPOFOL;  Surgeon: Lucilla Lame, MD;  Location: [facility] ENDOSCOPY;  Service: Endoscopy;  Laterality: N/A;  . DIAGNOSTIC LAPAROSCOPY    . LAPAROSCOPIC OVARIAN CYSTECTOMY Right 02/24/2019   Procedure: LAPAROSCOPIC RIGHT OVARIAN CYSTECTOMY;  Surgeon: [name], MD;  Location: [facility] ORS;  Service: Gynecology;  Laterality: Right;  . TONSILLECTOMY      Gynecologic History:  Patient's last menstrual period was 12/05/2019 (exact date). Contraception: OCP (estrogen/progesterone) Last Pap: Results were:03/10/2018 NIL and HR HPV negative   Obstetric History: G1P1001  Family History:  History reviewed. No pertinent family history.  Social History:  Social History   Socioeconomic History  . Marital status: Married    Spouse name: Not on file  . Number of children: Not on file  . Years of education: Not on file  . Highest education level: Not on file  Occupational History  . Not on file  Tobacco Use  . Smoking status: Never Smoker  . Smokeless tobacco: Never Used  Vaping Use  . Vaping Use: Never used  Substance and Sexual Activity  . Alcohol use: Never  . Drug use: Never  . Sexual activity: Yes    Partners: Male    Birth control/protection: Pill  Other Topics Concern  . Not on file  Social History Narrative  . Not on file   Social Determinants of Health   Financial Resource Strain:   . Difficulty of Paying Living Expenses:   Food Insecurity:   . Worried About Charity fundraiser in the Last Year:   . Arboriculturist in the Last Year:   Transportation Needs:   . Film/video editor (Medical):   Marland Kitchen Lack of Transportation (Non-Medical):   Physical Activity:   . Days of Exercise per Week:   . Minutes of Exercise per Session:   Stress:   . Feeling of Stress :   Social Connections:   . Frequency of Communication with Friends and Family:   . Frequency of Social Gatherings with Friends and Family:   . Attends Religious Services:   . Active  Member of Clubs or Organizations:   . Attends Archivist Meetings:   Marland Kitchen Marital Status:   Intimate Partner Violence:   . Fear of Current or Ex-Partner:   . Emotionally Abused:   Marland Kitchen Physically Abused:   . Sexually Abused:     Allergies:  Allergies  Allergen Reactions  . Penicillins Itching    Did it involve swelling of the face/tongue/throat, SOB, or low BP? No Did it involve sudden or severe rash/hives, skin peeling, or any reaction on the inside of your mouth or nose? No Did you need to seek medical attention at a hospital or doctor's office? No When did it last happen?may 2020 If all above answers are "NO", may proceed with cephalosporin use.   . Sulfa Antibiotics     Unknown,  childhood allergy    Medications: Prior to Admission medications   Medication Sig Start Date End Date Taking? Authorizing Provider  docusate sodium (COLACE) 100 MG capsule Take 100 mg by mouth daily as needed for mild constipation.    [provider]  ibuprofen (ADVIL) 600 MG tablet Take 1 tablet (600 mg total) by mouth every 6 (six) hours as needed. 02/24/19   [name], MD  loratadine (CLARITIN) 10 MG tablet Take 10 mg by mouth daily.    [provider]  norgestimate-ethinyl estradiol (ORTHO-CYCLEN) 0.25-35 MG-MCG tablet Take 1 tablet by mouth daily. 03/04/19   [name], MD  omeprazole (PRILOSEC OTC) 20 MG tablet Take 20 mg by mouth daily as needed (acid reflux).    [provider]  oxyCODONE-acetaminophen (PERCOCET) 5-325 MG tablet Take 1 tablet by mouth every 4 (four) hours as needed for severe pain. Patient not taking: Reported on 03/04/2019 02/24/19 02/24/20  [name], MD    Physical Exam Vitals: Blood pressure 134/84, pulse 93, height 5\' 9"  (1.753 m), weight 263 lb (119.3 kg), last menstrual period 12/05/2019, not currently breastfeeding.   General: NAD HEENT: normocephalic, anicteric Thyroid: no enlargement, no palpable  nodules Pulmonary: No increased work of breathing, CTAB Cardiovascular: RRR, distal pulses 2+ Breast: Breast symmetrical, no tenderness, no palpable nodules or masses, no skin or nipple retraction present, no nipple discharge.  No axillary or supraclavicular lymphadenopathy. Abdomen: NABS, soft, non-tender, non-distended.  Umbilicus without lesions.  No hepatomegaly, splenomegaly or masses palpable. No evidence of hernia  Genitourinary:  External: Normal external female genitalia.  Normal urethral meatus, normal Bartholin's and Skene's glands.    Vagina: Normal vaginal mucosa, no evidence of prolapse.    Cervix: Grossly normal in appearance, no bleeding  Uterus: Non-enlarged, mobile, normal contour.  No CMT  Adnexa: ovaries non-enlarged, no adnexal masses  Rectal: deferred  Lymphatic: no evidence of inguinal lymphadenopathy Extremities: no edema, erythema, or tenderness Neurologic: Grossly intact Psychiatric: mood appropriate, affect full  Female chaperone present for pelvic and breast  portions of the physical exam    Assessment: 29 y.o. G1P1001 routine annual exam  Plan: Problem List Items Addressed This Visit    None    Visit Diagnoses    Encounter for gynecological examination without abnormal finding    -  Primary   Breast screening       Surveillance for birth control, oral contraceptives          1) STI screening  was notoffered and therefore not obtained  2)  ASCCP guidelines and rational discussed.  Patient opts for every 3 years screening interval  3) Contraception - the patient is currently using  OCP (estrogen/progesterone).  She is happy with her current form of contraception and plans to continue  - Rx refilled - interested in conceiving sometime after September of this year  4) Routine healthcare maintenance including cholesterol, diabetes screening discussed managed by PCP  5) Anxiety/Depression - stable on current does Lexapro.  Discussed SSRI safe to  continue during pregnancy no need to discontinue prior to attempting conception  6) Return in about 1 year (around 12/12/2020) for annual.   [name], MD, [place] 12/13/2019, 9:59 AM

## 2020-01-11 ENCOUNTER — Encounter: Payer: Self-pay | Admitting: Obstetrics and Gynecology

## 2020-03-06 ENCOUNTER — Telehealth: Payer: Self-pay

## 2020-03-06 NOTE — Telephone Encounter (Signed)
Spoke w/patient. Advised we have had several patients mention the same issue w/My chart. Uncertain if it is just related to our providers/practice or an overall my chart issue. Advised against drinking until patient is seen for NOB 10/6 and can discuss w/provider.

## 2020-03-06 NOTE — Telephone Encounter (Signed)
Patient was trying to send message thru mychart, but it wouldn't allow. She wanted to attached pictures of an energy drink she was drinking before she found out she was pregnant. She hasn't been drinking them since she found out. One has caffeine and one does not, but wanted [place] to review ingredients to see if it was safe in pregnancy. WC#[phone]

## 2020-03-28 ENCOUNTER — Ambulatory Visit (INDEPENDENT_AMBULATORY_CARE_PROVIDER_SITE_OTHER): Payer: BC Managed Care – PPO | Admitting: Obstetrics and Gynecology

## 2020-03-28 ENCOUNTER — Other Ambulatory Visit (HOSPITAL_COMMUNITY)
Admission: RE | Admit: 2020-03-28 | Discharge: 2020-03-28 | Disposition: A | Discharge status: Home / Self Care | Payer: BC Managed Care – PPO | Source: Ambulatory Visit | Attending: Obstetrics and Gynecology | Admitting: Obstetrics and Gynecology

## 2020-03-28 ENCOUNTER — Other Ambulatory Visit: Payer: Self-pay

## 2020-03-28 ENCOUNTER — Encounter: Payer: Self-pay | Admitting: Obstetrics and Gynecology

## 2020-03-28 VITALS — BP 122/76 | Wt 274.0 lb

## 2020-03-28 DIAGNOSIS — O0990 Supervision of high risk pregnancy, unspecified, unspecified trimester: Secondary | ICD-10-CM | POA: Diagnosis present

## 2020-03-28 DIAGNOSIS — Z113 Encounter for screening for infections with a predominantly sexual mode of transmission: Secondary | ICD-10-CM

## 2020-03-28 DIAGNOSIS — Z3A08 8 weeks gestation of pregnancy: Secondary | ICD-10-CM

## 2020-03-28 DIAGNOSIS — O99210 Obesity complicating pregnancy, unspecified trimester: Secondary | ICD-10-CM | POA: Insufficient documentation

## 2020-03-28 DIAGNOSIS — Z3689 Encounter for other specified antenatal screening: Secondary | ICD-10-CM

## 2020-03-28 DIAGNOSIS — O10919 Unspecified pre-existing hypertension complicating pregnancy, unspecified trimester: Secondary | ICD-10-CM

## 2020-03-28 DIAGNOSIS — Z3480 Encounter for supervision of other normal pregnancy, unspecified trimester: Secondary | ICD-10-CM | POA: Insufficient documentation

## 2020-03-28 NOTE — Progress Notes (Signed)
NOB 

## 2020-03-28 NOTE — Progress Notes (Signed)
New Obstetric Patient H&P    Chief Complaint: "Desires prenatal care"   History of Present Illness: Patient is a 29 y.o. G2P1001 Not Hispanic or Latino female, presents with amenorrhea and positive home pregnancy test. Patient's last menstrual period was 02/01/2020. and based on her  LMP, her EDD is Estimated Date of Delivery: 11/07/20 and her EGA is [redacted]w[redacted]d.  Her last pap smear was normal on 03/10/2018.   Since her LMP she claims she has experienced mild nausea, fatigue, breast tenderness. She denies vaginal bleeding. 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 Her prior pregnancies are notable for VAVD 8lbs 4oz infant, no other issues  Since her LMP, she admits to the use of tobacco products  no She admits close contact with children on a regular basis  yes  She has had chicken pox in the past yes She has had Tuberculosis exposures, symptoms, or previously tested positive for TB   no Current or past history of domestic violence. no  Genetic Screening/Teratology Counseling: (Includes patient, baby's father, or anyone in either family with:)   29. Patient's age >/= 59 at [facility]  no 2. Thalassemia ([place], [place], [place], or Asian background): MCV<80  no 3. Neural tube defect (meningomyelocele, spina bifida, anencephaly)  no 4. Congenital heart defect  no  5. Down syndrome  no 6. Tay-Sachs (Jewish, [place])  no 7. Canavan's Disease  no 8. Sickle cell disease or trait (African)  no  9. Hemophilia or other blood disorders  no  10. Muscular dystrophy  no  11. Cystic fibrosis  no  12. Huntington's Chorea  no  13. Mental retardation/autism  no 14. Other inherited genetic or chromosomal disorder  no 15. Maternal metabolic disorder (DM, PKU, etc)  no 16. Patient or FOB with a child with a birth defect not listed above no  16a. Patient or FOB with a birth defect themselves no 17. Recurrent pregnancy loss, or stillbirth  no  18. Any medications since LMP other than prenatal  vitamins (include vitamins, supplements, OTC meds, drugs, alcohol)  no 19. Any other genetic/environmental exposure to discuss  no  Infection History:   1. Lives with someone with TB or TB exposed  no  2. Patient or partner has history of genital herpes  no 3. Rash or viral illness since LMP  no 4. History of STI (GC, CT, HPV, syphilis, HIV)  no 5. History of recent travel :  no  Other pertinent information:  no     Review of Systems:10 point review of systems negative unless otherwise noted in HPI  Past Medical History:  Patient Active Problem List   Diagnosis Date Noted  . Supervision of other normal pregnancy, antepartum 03/28/2020  . Family history of colon cancer   . Postpartum care following vaginal delivery 11/01/2018  . Encounter for elective induction of labor 10/31/2018  . Supervision of high risk pregnancy, antepartum 03/10/2018    Clinic [facility] Prenatal Labs  Dating LMP = 7 week [place] Blood type: O/Positive/-- (09/18 1153)   Genetic Screen NIPS:Normal XX Inheritest: SMA, CF, Fragile-X negative Antibody:Negative (09/18 1153)  Anatomic [place] Incomplete 4-chamber and outflow tracts [x]  Complete 24 weeks Rubella: 5.81 (09/18 1153)  Varicella: Immune  GTT Early:111    Third trimester: 102 RPR: Non Reactive (09/18 1153)   Rhogam N/A HBsAg: Negative (09/18 1153)   TDaP vaccine   08/31/2018 Flu Shot: 03/19/2018 HIV: Non Reactive (09/18 1153)   Baby Food  Breast  GBS:   Contraception  POP Pap: 03/10/18 NIL  CBB     CS/VBAC N/A  [ ]  anesthesia referral  Support Person Husband [name]        . Chronic hypertension during pregnancy, antepartum 03/10/2018    [X]  Aspirin 81 mg daily after 12 weeks; discontinue after 36 weeks [X]  baseline labs with CBC, CMP, urine protein/creatinine ratio [X]  no BP meds unless BPs become elevated [X]  ultrasound for growth at 28, 32, 36 weeks - 10/18/2018 [redacted]w[redacted]d 7lbs 10oz (3458g) c/w 71.2%ile - 10/11/2018 [redacted]w[redacted]d 7lbs 0oz  (3186g) c/w 64.6%ile - 08/31/2018 [redacted]w[redacted]d 4lbs 1oz (1,840g) c/w 62.6%ile   Current antihypertensives:  None   Baseline and surveillance labs (pulled in from [facility], refresh links as needed)  Lab Results  Component Value Date   PLT 379 03/10/2018   CREATININE 0.70 03/10/2018   AST 18 03/10/2018   ALT 10 03/10/2018    Antenatal Testing CHTN - O10.919  Group I  BP < 140/90, no preeclampsia, AGA,  nml AFV, +/- meds    Group II BP > 140/90, on meds, no preeclampsia, AGA, nml AFV  20-28-34-38  20-24-28-32-35-38  32//2 x wk  28//BPP wkly then 32//2 x wk  40 no meds; 39 meds  PRN or 37  Pre-eclampsia  GHTN - O13.9/Preeclampsia without severe features  - O14.00   Preeclampsia with severe features - O14.10  Q 3-4wks  Q 2 wks  28//BPP wkly then 32//2 x wk  Inpatient  37  PRN or 34     . Obesity affecting pregnancy 03/10/2018    Past Surgical History:  Past Surgical History:  Procedure Laterality Date  . COLONOSCOPY WITH PROPOFOL N/A 05/27/2019   Procedure: COLONOSCOPY WITH PROPOFOL;  Surgeon: Lucilla Lame, MD;  Location: [facility] ENDOSCOPY;  Service: Endoscopy;  Laterality: N/A;  . DIAGNOSTIC LAPAROSCOPY    . LAPAROSCOPIC OVARIAN CYSTECTOMY Right 02/24/2019   Procedure: LAPAROSCOPIC RIGHT OVARIAN CYSTECTOMY;  Surgeon: [name], MD;  Location: [facility] ORS;  Service: Gynecology;  Laterality: Right;  . TONSILLECTOMY      Gynecologic History: Patient's last menstrual period was 02/01/2020.  Obstetric History: G2P1001  Family History:  Family History  Problem Relation Age of Onset  . Breast cancer Other 50  . Breast cancer Maternal Uncle 38    Social History:  Social History   Socioeconomic History  . Marital status: Married    Spouse name: Not on file  . Number of children: Not on file  . Years of education: Not on file  . Highest education level: Not on file  Occupational History  . Not on file  Tobacco Use  . Smoking status: Never Smoker  . Smokeless  tobacco: Never Used  Vaping Use  . Vaping Use: Never used  Substance and Sexual Activity  . Alcohol use: Never  . Drug use: Never  . Sexual activity: Yes    Partners: Male    Birth control/protection: None  Other Topics Concern  . Not on file  Social History Narrative  . Not on file   Social Determinants of Health   Financial Resource Strain:   . Difficulty of Paying Living Expenses: Not on file  Food Insecurity:   . Worried About Charity fundraiser in the Last Year: Not on file  . Ran Out of Food in the Last Year: Not on file  Transportation Needs:   . Lack of Transportation (Medical): Not on file  . Lack of Transportation (Non-Medical): Not on file  Physical Activity:   .  Days of Exercise per Week: Not on file  . Minutes of Exercise per Session: Not on file  Stress:   . Feeling of Stress : Not on file  Social Connections:   . Frequency of Communication with Friends and Family: Not on file  . Frequency of Social Gatherings with Friends and Family: Not on file  . Attends Religious Services: Not on file  . Active Member of Clubs or Organizations: Not on file  . Attends Archivist Meetings: Not on file  . Marital Status: Not on file  Intimate Partner Violence:   . Fear of Current or Ex-Partner: Not on file  . Emotionally Abused: Not on file  . Physically Abused: Not on file  . Sexually Abused: Not on file    Allergies:  Allergies  Allergen Reactions  . Penicillins Itching    Did it involve swelling of the face/tongue/throat, SOB, or low BP? No Did it involve sudden or severe rash/hives, skin peeling, or any reaction on the inside of your mouth or nose? No Did you need to seek medical attention at a hospital or doctor's office? No When did it last happen?may 2020 If all above answers are "NO", may proceed with cephalosporin use.   . Sulfa Antibiotics     Unknown, childhood allergy    Medications: Prior to Admission medications   Medication Sig  Start Date End Date Taking? Authorizing Provider  escitalopram (LEXAPRO) 20 MG tablet Take 20 mg by mouth daily. 11/24/19  Yes [provider]  loratadine (CLARITIN) 10 MG tablet Take 10 mg by mouth daily.   Yes [provider]  omeprazole (PRILOSEC OTC) 20 MG tablet Take 20 mg by mouth daily as needed (acid reflux).   Yes [provider]  VITAMIN D PO Take by mouth.   Yes [provider]    Physical Exam Vitals: Blood pressure 122/76, weight 274 lb (124.3 kg), last menstrual period 02/01/2020, not currently breastfeeding. Body mass index is 40.46 kg/m.  General: NAD HEENT: normocephalic, anicteric Thyroid: no enlargement, no palpable nodules Pulmonary: No increased work of breathing, CTAB Cardiovascular: RRR, distal pulses 2+ Abdomen: NABS, soft, non-tender, non-distended.  Umbilicus without lesions.  No hepatomegaly, splenomegaly or masses palpable. No evidence of hernia  Genitourinary:  External: Normal external female genitalia.  Normal urethral meatus, normal  Bartholin's and Skene's glands.    Vagina: Normal vaginal mucosa, no evidence of prolapse.    Cervix: Grossly normal in appearance, no bleeding  Uterus: Non-enlarged, mobile, normal contour.  No CMT  Adnexa: ovaries non-enlarged, no adnexal masses  Rectal: deferred Extremities: no edema, erythema, or tenderness Neurologic: Grossly intact Psychiatric: mood appropriate, affect full   Assessment: 29 y.o. G2P1001 at [redacted]w[redacted]d presenting to initiate prenatal care  Plan: 1) Avoid alcoholic beverages. 2) Patient encouraged not to smoke.  3) Discontinue the use of all non-medicinal drugs and chemicals.  4) Take prenatal vitamins daily.  5) Nutrition, food safety (fish, cheese advisories, and high nitrite foods) and exercise discussed. 6) Hospital and practice style discussed with cross coverage system.  7) Genetic Screening, such as with 1st Trimester Screening, cell free fetal DNA, AFP testing,  and Ultrasound, as well as with amniocentesis and CVS as appropriate, is discussed with patient. At the conclusion of today's visit patient requested genetic testing 8) Dating scan ordered 10) Baseline [place], MD, [place], [facility] 03/28/2020, 9:56 AM

## 2020-03-29 LAB — RPR+RH+ABO+RUB AB+AB SCR+CB...
Antibody Screen: NEGATIVE
HIV Screen 4th Generation wRfx: NONREACTIVE
Hematocrit: 40.3 % (ref 34.0–46.6)
Hemoglobin: 13 g/dL (ref 11.1–15.9)
Hepatitis B Surface Ag: NEGATIVE
MCH: 26.7 pg (ref 26.6–33.0)
MCHC: 32.3 g/dL (ref 31.5–35.7)
MCV: 83 fL (ref 79–97)
Platelets: 392 10*3/uL (ref 150–450)
RBC: 4.86 x10E6/uL (ref 3.77–5.28)
RDW: 13.6 % (ref 11.7–15.4)
RPR Ser Ql: NONREACTIVE
Rh Factor: POSITIVE
Rubella Antibodies, IGG: 1.5 index (ref >0.99)
Varicella zoster IgG: 1184 index (ref >165)
WBC: 12.3 10*3/uL — ABNORMAL HIGH (ref 3.4–10.8)

## 2020-03-29 LAB — COMPREHENSIVE METABOLIC PANEL
ALT: 8 IU/L (ref 0–32)
AST: 15 IU/L (ref 0–40)
Albumin/Globulin Ratio: 1.4 (ref 1.2–2.2)
Albumin: 4 g/dL (ref 3.9–5.0)
Alkaline Phosphatase: 146 IU/L — ABNORMAL HIGH (ref 44–121)
BUN/Creatinine Ratio: 16 (ref 9–23)
BUN: 9 mg/dL (ref 6–20)
Bilirubin Total: <0.2 mg/dL (ref 0.0–1.2)
CO2: 23 mmol/L (ref 20–29)
Calcium: 9 mg/dL (ref 8.7–10.2)
Chloride: 104 mmol/L (ref 96–106)
Creatinine, Ser: 0.57 mg/dL (ref 0.57–1.00)
GFR calc Af Amer: 144 mL/min/{1.73_m2} (ref >59)
GFR calc non Af Amer: 125 mL/min/{1.73_m2} (ref >59)
Globulin, Total: 2.8 g/dL (ref 1.5–4.5)
Glucose: 81 mg/dL (ref 65–99)
Potassium: 4.5 mmol/L (ref 3.5–5.2)
Sodium: 140 mmol/L (ref 134–144)
Total Protein: 6.8 g/dL (ref 6.0–8.5)

## 2020-03-29 LAB — CERVICOVAGINAL ANCILLARY ONLY
Chlamydia: NEGATIVE
Comment: NEGATIVE
Comment: NORMAL
Neisseria Gonorrhea: NEGATIVE

## 2020-03-29 LAB — HEMOGLOBIN A1C
Est. average glucose Bld gHb Est-mCnc: 105 mg/dL
Hgb A1c MFr Bld: 5.3 % (ref 4.8–5.6)

## 2020-03-30 LAB — URINE CULTURE

## 2020-03-30 LAB — PROTEIN / CREATININE RATIO, URINE
Creatinine, Urine: 155.6 mg/dL
Protein, Ur: 10.7 mg/dL
Protein/Creat Ratio: 69 mg/g creat (ref 0–200)

## 2020-04-06 ENCOUNTER — Encounter: Payer: Self-pay | Admitting: Advanced Practice Midwife

## 2020-04-06 ENCOUNTER — Ambulatory Visit (INDEPENDENT_AMBULATORY_CARE_PROVIDER_SITE_OTHER): Payer: BC Managed Care – PPO

## 2020-04-06 ENCOUNTER — Other Ambulatory Visit: Payer: Self-pay

## 2020-04-06 ENCOUNTER — Ambulatory Visit (INDEPENDENT_AMBULATORY_CARE_PROVIDER_SITE_OTHER): Payer: BC Managed Care – PPO | Admitting: Advanced Practice Midwife

## 2020-04-06 ENCOUNTER — Other Ambulatory Visit: Payer: Self-pay | Admitting: Obstetrics and Gynecology

## 2020-04-06 VITALS — BP 130/80 | Wt 276.0 lb

## 2020-04-06 DIAGNOSIS — Z3A09 9 weeks gestation of pregnancy: Secondary | ICD-10-CM | POA: Diagnosis not present

## 2020-04-06 DIAGNOSIS — Z3689 Encounter for other specified antenatal screening: Secondary | ICD-10-CM

## 2020-04-06 DIAGNOSIS — O0990 Supervision of high risk pregnancy, unspecified, unspecified trimester: Secondary | ICD-10-CM

## 2020-04-06 DIAGNOSIS — O0991 Supervision of high risk pregnancy, unspecified, first trimester: Secondary | ICD-10-CM

## 2020-04-06 DIAGNOSIS — O99211 Obesity complicating pregnancy, first trimester: Secondary | ICD-10-CM

## 2020-04-06 LAB — POCT URINALYSIS DIPSTICK OB
Glucose, UA: NEGATIVE
POC,PROTEIN,UA: NEGATIVE

## 2020-04-06 NOTE — Progress Notes (Signed)
Routine Prenatal Care Visit  Subjective  Claudia Byrd is a 29 y.o. G2P1001 at [redacted]w[redacted]d being seen today for ongoing prenatal care.  She is currently monitored for the following issues for this high-risk pregnancy and has Supervision of high risk pregnancy, antepartum; Chronic hypertension during pregnancy, antepartum; Obesity affecting pregnancy; Family history of colon cancer; and Maternal obesity, antepartum on their problem list.  ----------------------------------------------------------------------------------- Patient reports no complaints.    . Vag. Bleeding: None.   . Leaking Fluid denies.  ----------------------------------------------------------------------------------- The following portions of the patient's history were reviewed and updated as appropriate: allergies, current medications, past family history, past medical history, past social history, past surgical history and problem list. Problem list updated.  Objective  Blood pressure 130/80, weight 276 lb (125.2 kg), last menstrual period 02/01/2020, not currently breastfeeding. Pregravid weight 270 lb (122.5 kg) Total Weight Gain 6 lb (2.722 kg) Urinalysis: Urine Protein Negative  Urine Glucose Negative  Fetal Status: Fetal Heart Rate (bpm): 165          Dating scan: agrees with LMP w/in 2 days, no adjustment of EDD  General:  Alert, oriented and cooperative. Patient is in no acute distress.  Skin: Skin is warm and dry. No rash noted.   Cardiovascular: Normal heart rate noted  Respiratory: Normal respiratory effort, no problems with respiration noted  Abdomen: Soft, gravid, appropriate for gestational age. Pain/Pressure: Absent     Pelvic:  Cervical exam deferred        Extremities: Normal range of motion.     Mental Status: Normal mood and affect. Normal behavior. Normal judgment and thought content.   Assessment   29 y.o. G2P1001 at [redacted]w[redacted]d by  11/07/2020, by Last Menstrual Period presenting for routine prenatal  visit  Plan   Pregnancy#2 Problems (from 02/01/20 to present)    Problem Noted Resolved   Maternal obesity, antepartum 03/28/2020 by [name], MD No   Supervision of high risk pregnancy, antepartum 03/10/2018 by [name], MD No   Overview Addendum 03/28/2020 10:01 AM by [name], MD    Clinic [facility] Prenatal Labs  Dating EDD by LMP c/w 9w u/s Blood type: O/Positive/-- (09/18 1153)   Genetic Screen NIPS: Inheritest: SMA, CF, Fragile-X negative 01/03/2018 Antibody:Negative (09/18 1153)  Anatomic [place] I Rubella: 5.81 (09/18 1153)  Varicella: Immune  GTT Early:   Third trimester:  RPR: Non Reactive (09/18 1153)   Rhogam N/A HBsAg: Negative (09/18 1153)   TDaP vaccine Flu Shot Covid    Pfeizer (booster as well) HIV: Non Reactive (09/18 1153)   Baby Food  Breast                               GBS:   Contraception  POP Pap: 03/10/18 NILM  CBB   Pelvis tested to 8lbs 4oz (VAVD)  CS/VBAC N/A  [ ]  anesthesia referral  Support Person Husband [name]           Previous Version   Chronic hypertension during pregnancy, antepartum 03/10/2018 by [name], MD No   Overview Addendum 03/28/2020 10:03 AM by [name], MD    [ ]  Aspirin 81 mg daily after 12 weeks; discontinue after 36 weeks [X]  baseline labs with CBC, CMP, urine protein/creatinine ratio [ ]  no BP meds unless BPs become elevated [ ]  ultrasound for growth at 28, 32, 36 weeks    Current antihypertensives:  None   Baseline and surveillance labs (pulled in  from [facility], refresh links as needed)  Lab Results  Component Value Date   PLT 379 03/10/2018   CREATININE 0.70 03/10/2018   AST 18 03/10/2018   ALT 10 03/10/2018    Antenatal Testing CHTN - O10.919  Group I  BP < 140/90, no preeclampsia, AGA,  nml AFV, +/- meds    Group II BP > 140/90, on meds, no preeclampsia, AGA, nml AFV  20-28-34-38  20-24-28-32-35-38  32//2 x wk  28//BPP wkly then 32//2 x wk  40 no meds; 39 meds  PRN or  37  Pre-eclampsia  GHTN - O13.9/Preeclampsia without severe features  - O14.00   Preeclampsia with severe features - O14.10  Q 3-4wks  Q 2 wks  28//BPP wkly then 32//2 x wk  Inpatient  37  PRN or 34        Previous Version   Obesity affecting pregnancy 03/10/2018 by [name], MD No       Preterm labor symptoms and general obstetric precautions including but not limited to vaginal bleeding, contractions, leaking of fluid and fetal movement were reviewed in detail with the patient.   Return in about 4 weeks (around 05/04/2020) for lab only visit in 1 wk for MaT 21 and rob in 4 wks.  Rod Can, CNM 04/06/2020 4:18 PM

## 2020-04-12 ENCOUNTER — Other Ambulatory Visit: Payer: Self-pay | Admitting: Obstetrics & Gynecology

## 2020-04-12 ENCOUNTER — Other Ambulatory Visit: Payer: BC Managed Care – PPO

## 2020-04-12 ENCOUNTER — Other Ambulatory Visit: Payer: Self-pay

## 2020-04-12 DIAGNOSIS — Z1379 Encounter for other screening for genetic and chromosomal anomalies: Secondary | ICD-10-CM

## 2020-04-13 ENCOUNTER — Other Ambulatory Visit: Payer: BC Managed Care – PPO

## 2020-04-17 LAB — MATERNIT21 PLUS CORE+SCA
Fetal Fraction: 3
Monosomy X (Turner Syndrome): NOT DETECTED
Result (T21): NEGATIVE
Trisomy 13 (Patau syndrome): NEGATIVE
Trisomy 18 (Edwards syndrome): NEGATIVE
Trisomy 21 (Down syndrome): NEGATIVE
XXX (Triple X Syndrome): NOT DETECTED
XXY (Klinefelter Syndrome): NOT DETECTED
XYY (Jacobs Syndrome): NOT DETECTED

## 2020-04-24 ENCOUNTER — Other Ambulatory Visit: Payer: Self-pay | Admitting: Obstetrics and Gynecology

## 2020-04-24 DIAGNOSIS — F32A Depression, unspecified: Secondary | ICD-10-CM

## 2020-04-24 MED ORDER — ESCITALOPRAM OXALATE 20 MG PO TABS: 20.0000 mg | ORAL_TABLET | Freq: Every day | ORAL | 11 refills | Status: DC

## 2020-05-04 ENCOUNTER — Other Ambulatory Visit: Payer: Self-pay

## 2020-05-04 ENCOUNTER — Ambulatory Visit (INDEPENDENT_AMBULATORY_CARE_PROVIDER_SITE_OTHER): Payer: BC Managed Care – PPO | Admitting: Obstetrics and Gynecology

## 2020-05-04 VITALS — BP 110/80 | Wt 281.0 lb

## 2020-05-04 DIAGNOSIS — Z23 Encounter for immunization: Secondary | ICD-10-CM | POA: Diagnosis not present

## 2020-05-04 DIAGNOSIS — Z363 Encounter for antenatal screening for malformations: Secondary | ICD-10-CM

## 2020-05-04 DIAGNOSIS — O10919 Unspecified pre-existing hypertension complicating pregnancy, unspecified trimester: Secondary | ICD-10-CM

## 2020-05-04 DIAGNOSIS — O0990 Supervision of high risk pregnancy, unspecified, unspecified trimester: Secondary | ICD-10-CM

## 2020-05-04 DIAGNOSIS — Z3A13 13 weeks gestation of pregnancy: Secondary | ICD-10-CM

## 2020-05-04 DIAGNOSIS — O99210 Obesity complicating pregnancy, unspecified trimester: Secondary | ICD-10-CM

## 2020-05-04 LAB — POCT URINALYSIS DIPSTICK OB
Glucose, UA: NEGATIVE
POC,PROTEIN,UA: NEGATIVE

## 2020-05-04 NOTE — Progress Notes (Signed)
Routine Prenatal Care Visit  Subjective  Claudia Byrd is a 29 y.o. G2P1001 at [redacted]w[redacted]d being seen today for ongoing prenatal care.  She is currently monitored for the following issues for this high-risk pregnancy and has Supervision of high risk pregnancy, antepartum; Chronic hypertension during pregnancy, antepartum; Obesity affecting pregnancy; Family history of colon cancer; and Maternal obesity, antepartum on their problem list.  ----------------------------------------------------------------------------------- Patient reports no complaints.   Contractions: Not present. Vag. Bleeding: None.  Movement: Absent. Denies leaking of fluid.  ----------------------------------------------------------------------------------- The following portions of the patient's history were reviewed and updated as appropriate: allergies, current medications, past family history, past medical history, past social history, past surgical history and problem list. Problem list updated.   Objective  Blood pressure 110/80, weight 281 lb (127.5 kg), last menstrual period 02/01/2020, not currently breastfeeding. Pregravid weight 270 lb (122.5 kg) Total Weight Gain 11 lb (4.99 kg) Urinalysis:      Fetal Status: Fetal Heart Rate (bpm): 145   Movement: Absent     General:  Alert, oriented and cooperative. Patient is in no acute distress.  Skin: Skin is warm and dry. No rash noted.   Cardiovascular: Normal heart rate noted  Respiratory: Normal respiratory effort, no problems with respiration noted  Abdomen: Soft, gravid, appropriate for gestational age. Pain/Pressure: Absent     Pelvic:  Cervical exam deferred        Extremities: Normal range of motion.     ental Status: Normal mood and affect. Normal behavior. Normal judgment and thought content.     Assessment   29 y.o. G2P1001 at [redacted]w[redacted]d by  11/07/2020, by Last Menstrual Period presenting for routine prenatal visit  Plan   Pregnancy#2 Problems (from  02/01/20 to present)    Problem Noted Resolved   Maternal obesity, antepartum 03/28/2020 by [name], MD No   Supervision of high risk pregnancy, antepartum 03/10/2018 by [name], MD No   Overview Addendum 04/06/2020  4:22 PM by Rod Can, [place] Prenatal Labs  Dating EDD by LMP c/w 9w u/s Blood type: O/Positive/-- (09/18 1153)   Genetic Screen NIPS: Inheritest: SMA, CF, Fragile-X negative 01/03/2018 Antibody:Negative (09/18 1153)  Anatomic [place] I Rubella: 5.81 (09/18 1153)  Varicella: Immune  GTT Early:   Third trimester:  RPR: Non Reactive (09/18 1153)   Rhogam N/A HBsAg: Negative (09/18 1153)   TDaP vaccine Flu Shot Covid    Pfeizer (booster as well) HIV: Non Reactive (09/18 1153)   Baby Food  Breast                               GBS:   Contraception  POP Pap: 03/10/18 NILM  CBB   Pelvis tested to 8lbs 4oz (VAVD)  CS/VBAC N/A  [ ]  anesthesia referral  Support Person Husband [name]           Previous Version   Chronic hypertension during pregnancy, antepartum 03/10/2018 by [name], MD No   Overview Addendum 03/28/2020 10:03 AM by [name], MD    [ ]  Aspirin 81 mg daily after 12 weeks; discontinue after 36 weeks [X]  baseline labs with CBC, CMP, urine protein/creatinine ratio [ ]  no BP meds unless BPs become elevated [ ]  ultrasound for growth at 28, 32, 36 weeks    Current antihypertensives:  None   Baseline and surveillance labs (pulled in from [facility], refresh links as needed)  Lab Results  Component Value Date   PLT 379 03/10/2018   CREATININE 0.70 03/10/2018   AST 18 03/10/2018   ALT 10 03/10/2018    Antenatal Testing CHTN - O10.919  Group I  BP < 140/90, no preeclampsia, AGA,  nml AFV, +/- meds    Group II BP > 140/90, on meds, no preeclampsia, AGA, nml AFV  20-28-34-38  20-24-28-32-35-38  32//2 x wk  28//BPP wkly then 32//2 x wk  40 no meds; 39 meds  PRN or 37  Pre-eclampsia  GHTN - O13.9/Preeclampsia  without severe features  - O14.00   Preeclampsia with severe features - O14.10  Q 3-4wks  Q 2 wks  28//BPP wkly then 32//2 x wk  Inpatient  37  PRN or 34        Previous Version   Obesity affecting pregnancy 03/10/2018 by [name], MD No       Gestational age appropriate obstetric precautions including but not limited to vaginal bleeding, contractions, leaking of fluid and fetal movement were reviewed in detail with the patient.    Return in about 4 weeks (around 06/01/2020) for ROB and 1-hr glucose test 4 weeks, ROB anatomy scan 7 weeks.  [name], MD, [facility], [facility] 05/04/2020, 10:23 AM

## 2020-05-04 NOTE — Progress Notes (Signed)
ROB- no concerns 

## 2020-06-01 ENCOUNTER — Other Ambulatory Visit: Payer: BC Managed Care – PPO

## 2020-06-01 ENCOUNTER — Other Ambulatory Visit: Payer: Self-pay

## 2020-06-01 ENCOUNTER — Ambulatory Visit (INDEPENDENT_AMBULATORY_CARE_PROVIDER_SITE_OTHER): Payer: BC Managed Care – PPO | Admitting: Obstetrics and Gynecology

## 2020-06-01 VITALS — BP 112/80 | Wt 291.0 lb

## 2020-06-01 DIAGNOSIS — O10919 Unspecified pre-existing hypertension complicating pregnancy, unspecified trimester: Secondary | ICD-10-CM

## 2020-06-01 DIAGNOSIS — O0990 Supervision of high risk pregnancy, unspecified, unspecified trimester: Secondary | ICD-10-CM

## 2020-06-01 DIAGNOSIS — O99210 Obesity complicating pregnancy, unspecified trimester: Secondary | ICD-10-CM

## 2020-06-01 DIAGNOSIS — Z3A17 17 weeks gestation of pregnancy: Secondary | ICD-10-CM

## 2020-06-01 DIAGNOSIS — O99211 Obesity complicating pregnancy, first trimester: Secondary | ICD-10-CM

## 2020-06-01 LAB — POCT URINALYSIS DIPSTICK OB
Glucose, UA: NEGATIVE
POC,PROTEIN,UA: NEGATIVE

## 2020-06-01 NOTE — Progress Notes (Signed)
ROB - 1hr gtt, no concerns

## 2020-06-01 NOTE — Progress Notes (Signed)
Routine Prenatal Care Visit  Subjective  Claudia Byrd is a 29 y.o. G2P1001 at [redacted]w[redacted]d being seen today for ongoing prenatal care.  She is currently monitored for the following issues for this low-risk pregnancy and has Supervision of high risk pregnancy, antepartum; Chronic hypertension during pregnancy, antepartum; Obesity affecting pregnancy; Family history of colon cancer; and Maternal obesity, antepartum on their problem list.  ----------------------------------------------------------------------------------- Patient reports no complaints.   Contractions: Not present. Vag. Bleeding: None.  Movement: Present. Denies leaking of fluid.  ----------------------------------------------------------------------------------- The following portions of the patient's history were reviewed and updated as appropriate: allergies, current medications, past family history, past medical history, past social history, past surgical history and problem list. Problem list updated.   Objective  Blood pressure 112/80, weight 291 lb (132 kg), last menstrual period 02/01/2020, not currently breastfeeding. Pregravid weight 270 lb (122.5 kg) Total Weight Gain 21 lb (9.526 kg) Urinalysis:      Fetal Status: Fetal Heart Rate (bpm): 140   Movement: Present     General:  Alert, oriented and cooperative. Patient is in no acute distress.  Skin: Skin is warm and dry. No rash noted.   Cardiovascular: Normal heart rate noted  Respiratory: Normal respiratory effort, no problems with respiration noted  Abdomen: Soft, gravid, appropriate for gestational age. Pain/Pressure: Absent     Pelvic:  Cervical exam deferred        Extremities: Normal range of motion.     ental Status: Normal mood and affect. Normal behavior. Normal judgment and thought content.     Assessment   29 y.o. G2P1001 at [redacted]w[redacted]d by  11/07/2020, by Last Menstrual Period presenting for routine prenatal visit  Plan   Pregnancy#2 Problems (from  02/01/20 to present)    Problem Noted Resolved   Maternal obesity, antepartum 03/28/2020 by [name], MD No   Supervision of high risk pregnancy, antepartum 03/10/2018 by [name], MD No   Overview Addendum 04/06/2020  4:22 PM by Rod Can, [place] Prenatal Labs  Dating EDD by LMP c/w 9w u/s Blood type: O/Positive/-- (09/18 1153)   Genetic Screen NIPS: Inheritest: SMA, CF, Fragile-X negative 01/03/2018 Antibody:Negative (09/18 1153)  Anatomic [place] I Rubella: 5.81 (09/18 1153)  Varicella: Immune  GTT Early:   Third trimester:  RPR: Non Reactive (09/18 1153)   Rhogam N/A HBsAg: Negative (09/18 1153)   TDaP vaccine Flu Shot Covid    Pfeizer (booster as well) HIV: Non Reactive (09/18 1153)   Baby Food  Breast                               GBS:   Contraception  POP Pap: 03/10/18 NILM  CBB   Pelvis tested to 8lbs 4oz (VAVD)  CS/VBAC N/A  [ ]  anesthesia referral  Support Person Husband [name]           Previous Version   Chronic hypertension during pregnancy, antepartum 03/10/2018 by [name], MD No   Overview Addendum 03/28/2020 10:03 AM by [name], MD    [ ]  Aspirin 81 mg daily after 12 weeks; discontinue after 36 weeks [X]  baseline labs with CBC, CMP, urine protein/creatinine ratio [ ]  no BP meds unless BPs become elevated [ ]  ultrasound for growth at 28, 32, 36 weeks    Current antihypertensives:  None   Baseline and surveillance labs (pulled in from [facility], refresh links as needed)  Lab Results  Component Value Date   PLT 379 03/10/2018   CREATININE 0.70 03/10/2018   AST 18 03/10/2018   ALT 10 03/10/2018    Antenatal Testing CHTN - O10.919  Group I  BP < 140/90, no preeclampsia, AGA,  nml AFV, +/- meds    Group II BP > 140/90, on meds, no preeclampsia, AGA, nml AFV  20-28-34-38  20-24-28-32-35-38  32//2 x wk  28//BPP wkly then 32//2 x wk  40 no meds; 39 meds  PRN or 37  Pre-eclampsia  GHTN - O13.9/Preeclampsia  without severe features  - O14.00   Preeclampsia with severe features - O14.10  Q 3-4wks  Q 2 wks  28//BPP wkly then 32//2 x wk  Inpatient  37  PRN or 34        Previous Version   Obesity affecting pregnancy 03/10/2018 by [name], MD No       Gestational age appropriate obstetric precautions including but not limited to vaginal bleeding, contractions, leaking of fluid and fetal movement were reviewed in detail with the patient.    Early glucola today  Return in about 3 weeks (around 06/22/2020) for ROB and anatomy scan (scheduled already I believe).  [name], MD, [facility], [facility] 06/01/2020, 9:55 AM

## 2020-06-02 LAB — GLUCOSE, 1 HOUR GESTATIONAL: Gestational Diabetes Screen: 131 mg/dL (ref 65–139)

## 2020-06-23 NOTE — L&D Delivery Note (Addendum)
Delivery Note At 3:39 AM a viable female child was delivered via Vaginal, Spontaneous (Presentation: Left Occiput Anterior).  APGAR:2, 4, 8  weight pending.   Placenta status: Spontaneous, Intact.  Cord: 3 vessels with the following complications: patient initially with poor respiratory effort, nuchal cord x 1 loose reduced on perineum, light meconium. Patient delivered rapidly in 3  Contractions.  Cord pH: N/A  Anesthesia: Epidural Episiotomy: None Lacerations: 1st degree Suture Repair: 2.0 monocryl Est. Blood Loss (mL): 365mL  Mom to postpartum.  Baby to Couplet care / Skin to Skin.  [name] 11/03/2020, 4:08 AM

## 2020-06-25 ENCOUNTER — Ambulatory Visit (INDEPENDENT_AMBULATORY_CARE_PROVIDER_SITE_OTHER): Payer: BC Managed Care – PPO

## 2020-06-25 ENCOUNTER — Ambulatory Visit (INDEPENDENT_AMBULATORY_CARE_PROVIDER_SITE_OTHER): Payer: BC Managed Care – PPO | Admitting: Obstetrics and Gynecology

## 2020-06-25 ENCOUNTER — Other Ambulatory Visit: Payer: Self-pay

## 2020-06-25 VITALS — BP 112/68 | Wt 303.0 lb

## 2020-06-25 DIAGNOSIS — Z363 Encounter for antenatal screening for malformations: Secondary | ICD-10-CM | POA: Diagnosis not present

## 2020-06-25 DIAGNOSIS — O99211 Obesity complicating pregnancy, first trimester: Secondary | ICD-10-CM

## 2020-06-25 DIAGNOSIS — O0990 Supervision of high risk pregnancy, unspecified, unspecified trimester: Secondary | ICD-10-CM

## 2020-06-25 DIAGNOSIS — Z3A20 20 weeks gestation of pregnancy: Secondary | ICD-10-CM

## 2020-06-25 DIAGNOSIS — O10919 Unspecified pre-existing hypertension complicating pregnancy, unspecified trimester: Secondary | ICD-10-CM

## 2020-06-25 LAB — POCT URINALYSIS DIPSTICK OB
Glucose, UA: NEGATIVE
POC,PROTEIN,UA: NEGATIVE

## 2020-06-25 NOTE — Progress Notes (Signed)
Routine Prenatal Care Visit  Subjective  Claudia Byrd is a 30 y.o. G2P1001 at [redacted]w[redacted]d being seen today for ongoing prenatal care.  She is currently monitored for the following issues for this high-risk pregnancy and has Supervision of high risk pregnancy, antepartum; Chronic hypertension during pregnancy, antepartum; Obesity affecting pregnancy; Family history of colon cancer; and Maternal obesity, antepartum on their problem list.  ----------------------------------------------------------------------------------- Patient reports no complaints.   Contractions: Not present. Vag. Bleeding: None.  Movement: Present. Denies leaking of fluid.  ----------------------------------------------------------------------------------- The following portions of the patient's history were reviewed and updated as appropriate: allergies, current medications, past family history, past medical history, past social history, past surgical history and problem list. Problem list updated.   Objective  Blood pressure 112/68, weight (!) 303 lb (137.4 kg), last menstrual period 02/01/2020, not currently breastfeeding. Pregravid weight 270 lb (122.5 kg) Total Weight Gain 33 lb (15 kg) Urinalysis:      Fetal Status: Fetal Heart Rate (bpm): 140   Movement: Present     General:  Alert, oriented and cooperative. Patient is in no acute distress.  Skin: Skin is warm and dry. No rash noted.   Cardiovascular: Normal heart rate noted  Respiratory: Normal respiratory effort, no problems with respiration noted  Abdomen: Soft, gravid, appropriate for gestational age. Pain/Pressure: Absent     Pelvic:  Cervical exam deferred        Extremities: Normal range of motion.     ental Status: Normal mood and affect. Normal behavior. Normal judgment and thought content.   US OB Comp + 14 Wk  Result Date: 06/25/2020 Patient Name: CHALYN THACHER DOB: 1990-12-25 MRN: [id number] ULTRASOUND REPORT Location: [facility] Date of Service:  06/25/2020 Indications:Anatomy Ultrasound Findings: [name] intrauterine pregnancy is visualized with FHR at 154 BPM. Biometrics give an (U/S) Gestational age of [redacted]w[redacted]d and an (U/S) EDD of 11/06/2020; this correlates with the clinically established Estimated Date of Delivery: 11/07/20 Fetal presentation is Cephalic. EFW: 341 g ( 12 oz ). Placenta: posterior. Grade: 2 AFI: subjectively normal. Anatomic survey is complete and normal; Gender - female.  Impression: 1. [redacted]w[redacted]d Viable Singleton Intrauterine pregnancy by U/S. 2. (U/S) EDD is consistent with Clinically established Estimated Date of Delivery: 11/07/20 . 3. Normal Anatomy Scan Recommendations: 1.Clinical correlation with the patient's History and Physical Exam. Gweneth Dimitri, RT  There is a singleton gestation with subjectively normal amniotic fluid volume. The fetal biometry correlates with established dating. Detailed evaluation of the fetal anatomy was performed.The fetal anatomical survey appears within normal limits within the resolution of ultrasound as described above.  It must be noted that a normal ultrasound is unable to rule out fetal aneuploidy, subtle defects such as small ASD or VDS may also not be visible on imaging.  [name], MD, [facility], [facility] 06/25/2020, 9:11 AM     Assessment   30 y.o. G2P1001 at [redacted]w[redacted]d by  11/07/2020, by Last Menstrual Period presenting for routine prenatal visit  Plan   Pregnancy#2 Problems (from 02/01/20 to present)    Problem Noted Resolved   Maternal obesity, antepartum 03/28/2020 by [name], MD No   Supervision of high risk pregnancy, antepartum 03/10/2018 by [name], MD No   Overview Addendum 04/06/2020  4:22 PM by Rod Can, [place] Prenatal Labs  Dating EDD by LMP c/w 9w u/s Blood type: O/Positive/-- (09/18 1153)   Genetic Screen NIPS: Inheritest: SMA, CF, Fragile-X negative 01/03/2018 Antibody:Negative (  09/18 1153)   Anatomic [place] Normal Rubella: 5.81 (09/18 1153)  Varicella: Immune  GTT Early:  HgbA1C 5.3, 1-hr 131 Third trimester:  RPR: Non Reactive (09/18 1153)   Rhogam N/A HBsAg: Negative (09/18 1153)   TDaP vaccine Flu Shot Covid    Pfeizer (booster as well) HIV: Non Reactive (09/18 1153)   Baby Food  Breast                               GBS:   Contraception  POP Pap: 03/10/18 NILM  CBB   Pelvis tested to 8lbs 4oz (VAVD)  CS/VBAC N/A  [ ]  anesthesia referral  Support Person Husband           Previous Version   Chronic hypertension during pregnancy, antepartum 03/10/2018 by 03/12/2018, MD No   Overview Addendum 03/28/2020 10:03 AM by 05/28/2020, MD    [ ]  Aspirin 81 mg daily after 12 weeks; discontinue after 36 weeks [X]  baseline labs with CBC, CMP, urine protein/creatinine ratio [ ]  no BP meds unless BPs become elevated [ ]  ultrasound for growth at 28, 32, 36 weeks    Current antihypertensives:  None   Baseline and surveillance labs (pulled in from [facility], refresh links as needed)  Lab Results  Component Value Date   PLT 379 03/10/2018   CREATININE 0.70 03/10/2018   AST 18 03/10/2018   ALT 10 03/10/2018    Antenatal Testing CHTN - O10.919  Group I  BP < 140/90, no preeclampsia, AGA,  nml AFV, +/- meds    Group II BP > 140/90, on meds, no preeclampsia, AGA, nml AFV  20-28-34-38  20-24-28-32-35-38  32//2 x wk  28//BPP wkly then 32//2 x wk  40 no meds; 39 meds  PRN or 37  Pre-eclampsia  GHTN - O13.9/Preeclampsia without severe features  - O14.00   Preeclampsia with severe features - O14.10  Q 3-4wks  Q 2 wks  28//BPP wkly then 32//2 x wk  Inpatient  37  PRN or 34        Previous Version   Obesity affecting pregnancy 03/10/2018 by 03/12/2018, MD No       Gestational age appropriate obstetric precautions including but not limited to vaginal bleeding, contractions, leaking of fluid and fetal movement were reviewed in detail with the  patient.    Return in about 4 weeks (around 07/23/2020) for ROB.  03-24-1971, MD, 03-21-1989 [facility], [facility] 06/25/2020, 9:29 AM

## 2020-07-24 ENCOUNTER — Other Ambulatory Visit: Payer: Self-pay

## 2020-07-24 ENCOUNTER — Ambulatory Visit (INDEPENDENT_AMBULATORY_CARE_PROVIDER_SITE_OTHER): Payer: BC Managed Care – PPO | Admitting: Obstetrics and Gynecology

## 2020-07-24 VITALS — BP 100/62 | Wt 309.0 lb

## 2020-07-24 DIAGNOSIS — O99210 Obesity complicating pregnancy, unspecified trimester: Secondary | ICD-10-CM

## 2020-07-24 DIAGNOSIS — Z3A24 24 weeks gestation of pregnancy: Secondary | ICD-10-CM

## 2020-07-24 DIAGNOSIS — O10919 Unspecified pre-existing hypertension complicating pregnancy, unspecified trimester: Secondary | ICD-10-CM

## 2020-07-24 DIAGNOSIS — O99212 Obesity complicating pregnancy, second trimester: Secondary | ICD-10-CM

## 2020-07-24 DIAGNOSIS — O0990 Supervision of high risk pregnancy, unspecified, unspecified trimester: Secondary | ICD-10-CM

## 2020-07-24 DIAGNOSIS — Z369 Encounter for antenatal screening, unspecified: Secondary | ICD-10-CM

## 2020-07-24 NOTE — Progress Notes (Signed)
Routine Prenatal Care Visit  Subjective  Claudia Byrd is a 30 y.o. G2P1001 at [redacted]w[redacted]d being seen today for ongoing prenatal care.  She is currently monitored for the following issues for this high-risk pregnancy and has Supervision of high risk pregnancy, antepartum; Chronic hypertension during pregnancy, antepartum; Obesity affecting pregnancy; Family history of colon cancer; and Maternal obesity, antepartum on their problem list.  ----------------------------------------------------------------------------------- Patient reports no complaints.   Contractions: Not present. Vag. Bleeding: None.  Movement: Present. Denies leaking of fluid.  ----------------------------------------------------------------------------------- The following portions of the patient's history were reviewed and updated as appropriate: allergies, current medications, past family history, past medical history, past social history, past surgical history and problem list. Problem list updated.   Objective  Blood pressure 100/62, weight (!) 309 lb (140.2 kg), last menstrual period 02/01/2020, not currently breastfeeding. Pregravid weight 270 lb (122.5 kg) Total Weight Gain 39 lb (17.7 kg) Urinalysis:      Fetal Status: Fetal Heart Rate (bpm): 140 Fundal Height: 25 cm Movement: Present     General:  Alert, oriented and cooperative. Patient is in no acute distress.  Skin: Skin is warm and dry. No rash noted.   Cardiovascular: Normal heart rate noted  Respiratory: Normal respiratory effort, no problems with respiration noted  Abdomen: Soft, gravid, appropriate for gestational age. Pain/Pressure: Absent     Pelvic:  Cervical exam deferred        Extremities: Normal range of motion.     ental Status: Normal mood and affect. Normal behavior. Normal judgment and thought content.     Assessment   30 y.o. G2P1001 at [redacted]w[redacted]d by  11/07/2020, by Last Menstrual Period presenting for routine prenatal visit  Plan    Pregnancy#2 Problems (from 02/01/20 to present)    Problem Noted Resolved   Maternal obesity, antepartum 03/28/2020 by [name], MD No   Supervision of high risk pregnancy, antepartum 03/10/2018 by [name], MD No   Overview Addendum 06/25/2020  9:28 AM by [name], [place] Prenatal Labs  Dating EDD by LMP c/w 9w u/s Blood type: O/Positive/-- (09/18 1153)   Genetic Screen NIPS: Normal XY Inheritest: SMA, CF, Fragile-X negative 01/03/2018 Antibody:Negative (09/18 1153)  Anatomic [place] Complete 06/25/20 Rubella: 5.81 (09/18 1153)  Varicella: Immune  GTT Early: Hgb1C 5.3, 1-hr 131 Third trimester:  RPR: Non Reactive (09/18 1153)   Rhogam N/A HBsAg: Negative (09/18 1153)   TDaP vaccine Flu Shot Covid    Pfeizer (booster as well) HIV: Non Reactive (09/18 1153)   Baby Food  Breast                               GBS:   Contraception  POP Pap: 03/10/18 NILM  CBB   Pelvis tested to 8lbs 4oz (VAVD)  CS/VBAC N/A  [ ]  anesthesia referral  Support Person Husband [name]           Previous Version   Chronic hypertension during pregnancy, antepartum 03/10/2018 by [name], MD No   Overview Addendum 03/28/2020 10:03 AM by [name], MD    [ ]  Aspirin 81 mg daily after 12 weeks; discontinue after 36 weeks [X]  baseline labs with CBC, CMP, urine protein/creatinine ratio [ ]  no BP meds unless BPs become elevated [ ]  ultrasound for growth at 28, 32, 36 weeks    Current antihypertensives:  None   Baseline and surveillance labs (pulled in from  [facility], refresh links as needed)  Lab Results  Component Value Date   PLT 379 03/10/2018   CREATININE 0.70 03/10/2018   AST 18 03/10/2018   ALT 10 03/10/2018    Antenatal Testing CHTN - O10.919  Group I  BP < 140/90, no preeclampsia, AGA,  nml AFV, +/- meds    Group II BP > 140/90, on meds, no preeclampsia, AGA, nml AFV  20-28-34-38  20-24-28-32-35-38  32//2 x wk  28//BPP wkly then 32//2 x wk  40  no meds; 39 meds  PRN or 37  Pre-eclampsia  GHTN - O13.9/Preeclampsia without severe features  - O14.00   Preeclampsia with severe features - O14.10  Q 3-4wks  Q 2 wks  28//BPP wkly then 32//2 x wk  Inpatient  37  PRN or 34        Previous Version   Obesity affecting pregnancy 03/10/2018 by [name], MD No       Gestational age appropriate obstetric precautions including but not limited to vaginal bleeding, contractions, leaking of fluid and fetal movement were reviewed in detail with the patient.    Return in about 4 weeks (around 08/21/2020) for ROB, 28 week labs, and growth scan.  [name], MD, [facility], [facility] 07/24/2020, 4:27 PM

## 2020-08-13 ENCOUNTER — Other Ambulatory Visit: Payer: Self-pay | Admitting: Obstetrics and Gynecology

## 2020-08-13 DIAGNOSIS — F32A Depression, unspecified: Secondary | ICD-10-CM

## 2020-08-13 DIAGNOSIS — F419 Anxiety disorder, unspecified: Secondary | ICD-10-CM

## 2020-08-13 MED ORDER — ESCITALOPRAM OXALATE 20 MG PO TABS: 20.0000 mg | ORAL_TABLET | Freq: Every day | ORAL | 3 refills | Status: DC

## 2020-08-21 ENCOUNTER — Other Ambulatory Visit: Payer: BC Managed Care – PPO

## 2020-08-21 ENCOUNTER — Encounter: Payer: BC Managed Care – PPO | Admitting: Obstetrics and Gynecology

## 2020-08-22 ENCOUNTER — Ambulatory Visit (INDEPENDENT_AMBULATORY_CARE_PROVIDER_SITE_OTHER): Payer: BC Managed Care – PPO | Admitting: Obstetrics and Gynecology

## 2020-08-22 ENCOUNTER — Other Ambulatory Visit: Payer: BC Managed Care – PPO

## 2020-08-22 ENCOUNTER — Ambulatory Visit (INDEPENDENT_AMBULATORY_CARE_PROVIDER_SITE_OTHER): Payer: BC Managed Care – PPO

## 2020-08-22 ENCOUNTER — Other Ambulatory Visit: Payer: Self-pay

## 2020-08-22 VITALS — BP 110/76 | Wt 315.0 lb

## 2020-08-22 DIAGNOSIS — Z3A30 30 weeks gestation of pregnancy: Secondary | ICD-10-CM | POA: Diagnosis not present

## 2020-08-22 DIAGNOSIS — O10919 Unspecified pre-existing hypertension complicating pregnancy, unspecified trimester: Secondary | ICD-10-CM

## 2020-08-22 DIAGNOSIS — O0990 Supervision of high risk pregnancy, unspecified, unspecified trimester: Secondary | ICD-10-CM

## 2020-08-22 DIAGNOSIS — O99212 Obesity complicating pregnancy, second trimester: Secondary | ICD-10-CM | POA: Diagnosis not present

## 2020-08-22 DIAGNOSIS — Z3A29 29 weeks gestation of pregnancy: Secondary | ICD-10-CM

## 2020-08-22 DIAGNOSIS — Z369 Encounter for antenatal screening, unspecified: Secondary | ICD-10-CM

## 2020-08-22 NOTE — Progress Notes (Signed)
Routine Prenatal Care Visit  Subjective  Claudia Byrd is a 30 y.o. G2P1001 at [redacted]w[redacted]d being seen today for ongoing prenatal care.  She is currently monitored for the following issues for this high-risk pregnancy and has Supervision of high risk pregnancy, antepartum; Chronic hypertension during pregnancy, antepartum; Obesity affecting pregnancy; Family history of colon cancer; and Maternal obesity, antepartum on their problem list.  ----------------------------------------------------------------------------------- Patient reports no complaints.   Contractions: Not present. Vag. Bleeding: None.  Movement: Present. Denies leaking of fluid.  ----------------------------------------------------------------------------------- The following portions of the patient's history were reviewed and updated as appropriate: allergies, current medications, past family history, past medical history, past social history, past surgical history and problem list. Problem list updated.   Objective  Blood pressure 110/76, weight (!) 315 lb (142.9 kg), last menstrual period 02/01/2020, not currently breastfeeding. Pregravid weight 270 lb (122.5 kg) Total Weight Gain 45 lb (20.4 kg) Urinalysis:      Fetal Status: Fetal Heart Rate (bpm): 145 Fundal Height: 29 cm Movement: Present     General:  Alert, oriented and cooperative. Patient is in no acute distress.  Skin: Skin is warm and dry. No rash noted.   Cardiovascular: Normal heart rate noted  Respiratory: Normal respiratory effort, no problems with respiration noted  Abdomen: Soft, gravid, appropriate for gestational age. Pain/Pressure: Absent     Pelvic:  Cervical exam deferred        Extremities: Normal range of motion.     ental Status: Normal mood and affect. Normal behavior. Normal judgment and thought content.   US OB Follow Up  Result Date: 08/22/2020 Patient Name: LILIAUNA SANTONI DOB: 1991/04/07 MRN: [id number] ULTRASOUND REPORT Location: [facility]  Date of Service: 08/22/2020 Indications:growth/afi Findings: [name] intrauterine pregnancy is visualized with FHR at 141 BPM. Biometrics give an (U/S) Gestational age of [redacted]w[redacted]d and an (U/S) EDD of 10/29/20; this correlates with the clinically established Estimated Date of Delivery: 11/07/20. Fetal presentation is Breech. Placenta: posterior. Grade: 2 AFI: 17.1 cm Growth percentile is 61.4.  AC percentile is 60.4. EFW: 1,425g (3lbs 2oz) Impression: 1. [redacted]w[redacted]d Viable Singleton Intrauterine pregnancy previously established criteria. 2. Growth is 61.4 %ile.  AFI is 17.1 cm. Recommendations: 1.Clinical correlation with the patient's History and Physical Exam. Vita Barley, RT There is a singleton gestation with normal amniotic fluid volume. The fetal biometry correlates with established dating.  he visualized fetal anatomy appears within normal limits within the resolution of ultrasound as described above.  It must be noted that a normal ultrasound is unable to rule out fetal aneuploidy.  [name], MD, [facility], [facility] 08/22/2020, 9:26 AM    Assessment   30 y.o. G2P1001 at [redacted]w[redacted]d by  11/07/2020, by Last Menstrual Period presenting for routine prenatal visit  Plan   Pregnancy#2 Problems (from 02/01/20 to present)    Problem Noted Resolved   Maternal obesity, antepartum 03/28/2020 by [name], MD No   Supervision of high risk pregnancy, antepartum 03/10/2018 by [name], MD No   Overview Addendum 06/25/2020  9:28 AM by [name], MD    Clinic [facility] Prenatal Labs  Dating EDD by LMP c/w 9w u/s Blood type: O/Positive/-- (09/18 1153)   Genetic Screen NIPS: Normal XY Inheritest: SMA, CF, Fragile-X negative 01/03/2018 Antibody:Negative (09/18 1153)  Anatomic [place] Complete 06/25/20 Rubella: 5.81 (09/18 1153)  Varicella: Immune  GTT Early: Hgb1C 5.3, 1-hr 131 Third trimester:  RPR: Non Reactive (09/18 1153)   Rhogam N/A HBsAg:  Negative (09/18 1153)    TDaP vaccine Flu Shot Covid    Pfeizer (booster as well) HIV: Non Reactive (09/18 1153)   Baby Food  Breast                               GBS:   Contraception  POP Pap: 03/10/18 NILM  CBB   Pelvis tested to 8lbs 4oz (VAVD)  CS/VBAC N/A  [ ]  anesthesia referral  Support Person Husband [name]           Previous Version   Chronic hypertension during pregnancy, antepartum 03/10/2018 by [name], MD No   Overview Addendum 03/28/2020 10:03 AM by [name], MD    [ ]  Aspirin 81 mg daily after 12 weeks; discontinue after 36 weeks [X]  baseline labs with CBC, CMP, urine protein/creatinine ratio [ ]  no BP meds unless BPs become elevated [ ]  ultrasound for growth at 28, 32, 36 weeks    Current antihypertensives:  None   Baseline and surveillance labs (pulled in from [facility], refresh links as needed)  Lab Results  Component Value Date   PLT 379 03/10/2018   CREATININE 0.70 03/10/2018   AST 18 03/10/2018   ALT 10 03/10/2018    Antenatal Testing CHTN - O10.919  Group I  BP < 140/90, no preeclampsia, AGA,  nml AFV, +/- meds    Group II BP > 140/90, on meds, no preeclampsia, AGA, nml AFV  20-28-34-38  20-24-28-32-35-38  32//2 x wk  28//BPP wkly then 32//2 x wk  40 no meds; 39 meds  PRN or 37  Pre-eclampsia  GHTN - O13.9/Preeclampsia without severe features  - O14.00   Preeclampsia with severe features - O14.10  Q 3-4wks  Q 2 wks  28//BPP wkly then 32//2 x wk  Inpatient  37  PRN or 34        Previous Version   Obesity affecting pregnancy 03/10/2018 by [name], MD No       Gestational age appropriate obstetric precautions including but not limited to vaginal bleeding, contractions, leaking of fluid and fetal movement were reviewed in detail with the patient.    Return in about 2 weeks (around 09/05/2020) for [place].  [name], MD, [facility], [facility] 08/22/2020, 9:39 AM

## 2020-08-23 LAB — 28 WEEK RH+PANEL
Basophils Absolute: 0 10*3/uL (ref 0.0–0.2)
Basos: 0 %
EOS (ABSOLUTE): 0.1 10*3/uL (ref 0.0–0.4)
Eos: 1 %
Gestational Diabetes Screen: 145 mg/dL — ABNORMAL HIGH (ref 65–139)
HIV Screen 4th Generation wRfx: NONREACTIVE
Hematocrit: 34.4 % (ref 34.0–46.6)
Hemoglobin: 11.3 g/dL (ref 11.1–15.9)
Immature Grans (Abs): 0.1 10*3/uL (ref 0.0–0.1)
Immature Granulocytes: 1 %
Lymphocytes Absolute: 2.9 10*3/uL (ref 0.7–3.1)
Lymphs: 23 %
MCH: 28.3 pg (ref 26.6–33.0)
MCHC: 32.8 g/dL (ref 31.5–35.7)
MCV: 86 fL (ref 79–97)
Monocytes Absolute: 0.6 10*3/uL (ref 0.1–0.9)
Monocytes: 5 %
Neutrophils Absolute: 8.9 10*3/uL — ABNORMAL HIGH (ref 1.4–7.0)
Neutrophils: 70 %
Platelets: 318 10*3/uL (ref 150–450)
RBC: 4 x10E6/uL (ref 3.77–5.28)
RDW: 13.2 % (ref 11.7–15.4)
RPR Ser Ql: NONREACTIVE
WBC: 12.6 10*3/uL — ABNORMAL HIGH (ref 3.4–10.8)

## 2020-08-24 ENCOUNTER — Other Ambulatory Visit: Payer: Self-pay | Admitting: Obstetrics and Gynecology

## 2020-08-24 ENCOUNTER — Telehealth: Payer: Self-pay

## 2020-08-24 DIAGNOSIS — O99810 Abnormal glucose complicating pregnancy: Secondary | ICD-10-CM

## 2020-08-24 NOTE — Telephone Encounter (Signed)
Patient is schedule for 08/29/20

## 2020-08-24 NOTE — Telephone Encounter (Signed)
-----   Message from [name], MD sent at 08/24/2020  1:45 PM EST ----- 3-hr glucose test sometime next week order is in

## 2020-08-24 NOTE — Progress Notes (Signed)
3-hr glucose test sometime next week order is in

## 2020-08-29 ENCOUNTER — Other Ambulatory Visit: Payer: Self-pay

## 2020-08-29 ENCOUNTER — Other Ambulatory Visit: Payer: BC Managed Care – PPO

## 2020-08-29 DIAGNOSIS — O99810 Abnormal glucose complicating pregnancy: Secondary | ICD-10-CM

## 2020-08-30 LAB — GESTATIONAL GLUCOSE TOLERANCE
Glucose, Fasting: 86 mg/dL (ref 65–94)
Glucose, GTT - 1 Hour: 155 mg/dL (ref 65–179)
Glucose, GTT - 2 Hour: 146 mg/dL (ref 65–154)
Glucose, GTT - 3 Hour: 110 mg/dL (ref 65–139)

## 2020-09-04 ENCOUNTER — Other Ambulatory Visit: Payer: Self-pay

## 2020-09-04 ENCOUNTER — Ambulatory Visit (INDEPENDENT_AMBULATORY_CARE_PROVIDER_SITE_OTHER): Payer: BC Managed Care – PPO | Admitting: Obstetrics and Gynecology

## 2020-09-04 VITALS — BP 112/70 | Wt 317.0 lb

## 2020-09-04 DIAGNOSIS — O0990 Supervision of high risk pregnancy, unspecified, unspecified trimester: Secondary | ICD-10-CM

## 2020-09-04 DIAGNOSIS — Z3A30 30 weeks gestation of pregnancy: Secondary | ICD-10-CM

## 2020-09-04 DIAGNOSIS — O99210 Obesity complicating pregnancy, unspecified trimester: Secondary | ICD-10-CM

## 2020-09-04 DIAGNOSIS — O10919 Unspecified pre-existing hypertension complicating pregnancy, unspecified trimester: Secondary | ICD-10-CM

## 2020-09-04 NOTE — Progress Notes (Signed)
Routine Prenatal Care Visit  Subjective  Claudia Byrd is a 30 y.o. G2P1001 at [redacted]w[redacted]d being seen today for ongoing prenatal care.  She is currently monitored for the following issues for this low-risk pregnancy and has Supervision of high risk pregnancy, antepartum; Chronic hypertension during pregnancy, antepartum; Obesity affecting pregnancy; Family history of colon cancer; and Maternal obesity, antepartum on their problem list.  ----------------------------------------------------------------------------------- Patient reports no complaints.   Contractions: Not present. Vag. Bleeding: None.  Movement: Present. Denies leaking of fluid.  ----------------------------------------------------------------------------------- The following portions of the patient's history were reviewed and updated as appropriate: allergies, current medications, past family history, past medical history, past social history, past surgical history and problem list. Problem list updated.   Objective  Blood pressure 112/70, weight (!) 317 lb (143.8 kg), last menstrual period 02/01/2020, not currently breastfeeding. Pregravid weight 270 lb (122.5 kg) Total Weight Gain 47 lb (21.3 kg) Urinalysis:      Fetal Status: Fetal Heart Rate (bpm): 150 Fundal Height: 32 cm Movement: Present     General:  Alert, oriented and cooperative. Patient is in no acute distress.  Skin: Skin is warm and dry. No rash noted.   Cardiovascular: Normal heart rate noted  Respiratory: Normal respiratory effort, no problems with respiration noted  Abdomen: Soft, gravid, appropriate for gestational age. Pain/Pressure: Absent     Pelvic:  Cervical exam deferred        Extremities: Normal range of motion.     ental Status: Normal mood and affect. Normal behavior. Normal judgment and thought content.     Assessment   30 y.o. G2P1001 at [redacted]w[redacted]d by  11/07/2020, by Last Menstrual Period presenting for routine prenatal visit  Plan    Pregnancy#2 Problems (from 02/01/20 to present)    Problem Noted Resolved   Maternal obesity, antepartum 03/28/2020 by [name], MD No   Supervision of high risk pregnancy, antepartum 03/10/2018 by [name], MD No   Overview Addendum 08/30/2020  7:55 PM by [name], [place] Prenatal Labs  Dating EDD by LMP c/w 9w u/s Blood type: O/Positive/-- (09/18 1153)   Genetic Screen NIPS: Normal XY Inheritest: SMA, CF, Fragile-X negative 01/03/2018 Antibody:Negative (09/18 1153)  Anatomic [place] Complete 06/25/20 Rubella: 5.81 (09/18 1153)  Varicella: Immune  GTT Early: Hgb1C 5.3, 1-hr 131 Third trimester: 145 3-hr 86, 155, 146, 110 RPR: Non Reactive (09/18 1153)   Rhogam N/A HBsAg: Negative (09/18 1153)   TDaP vaccine Flu Shot Covid    Pfeizer (booster as well) HIV: Non Reactive (09/18 1153)   Baby Food  Breast                               GBS:   Contraception  POP Pap: 03/10/18 NILM  CBB   Pelvis tested to 8lbs 4oz (VAVD)  CS/VBAC N/A  [ ]  anesthesia referral  Support Person Husband [name]           Previous Version   Chronic hypertension during pregnancy, antepartum 03/10/2018 by [name], MD No   Overview Addendum 03/28/2020 10:03 AM by [name], MD    [ ]  Aspirin 81 mg daily after 12 weeks; discontinue after 36 weeks [X]  baseline labs with CBC, CMP, urine protein/creatinine ratio [ ]  no BP meds unless BPs become elevated [ ]  ultrasound for growth at 28, 32, 36 weeks    Current antihypertensives:  None   Baseline and  surveillance labs (pulled in from [facility], refresh links as needed)  Lab Results  Component Value Date   PLT 379 03/10/2018   CREATININE 0.70 03/10/2018   AST 18 03/10/2018   ALT 10 03/10/2018    Antenatal Testing CHTN - O10.919  Group I  BP < 140/90, no preeclampsia, AGA,  nml AFV, +/- meds    Group II BP > 140/90, on meds, no preeclampsia, AGA, nml AFV  20-28-34-38  20-24-28-32-35-38  32//2 x wk  28//BPP  wkly then 32//2 x wk  40 no meds; 39 meds  PRN or 37  Pre-eclampsia  GHTN - O13.9/Preeclampsia without severe features  - O14.00   Preeclampsia with severe features - O14.10  Q 3-4wks  Q 2 wks  28//BPP wkly then 32//2 x wk  Inpatient  37  PRN or 34        Previous Version   Obesity affecting pregnancy 03/10/2018 by [name], MD No       Gestational age appropriate obstetric precautions including but not limited to vaginal bleeding, contractions, leaking of fluid and fetal movement were reviewed in detail with the patient.    1) BP remains well controlled  2) Elevated 1-hr, 3-hr all values normal  3) Growth scan next visit  Return in about 2 weeks (around 09/18/2020) for ROB and growth scan.  [name], MD, [facility], [facility] 09/04/2020, 2:06 PM

## 2020-09-11 ENCOUNTER — Ambulatory Visit: Payer: BC Managed Care – PPO

## 2020-09-11 ENCOUNTER — Encounter: Payer: BC Managed Care – PPO | Admitting: Obstetrics & Gynecology

## 2020-09-11 DIAGNOSIS — O0990 Supervision of high risk pregnancy, unspecified, unspecified trimester: Secondary | ICD-10-CM

## 2020-09-11 DIAGNOSIS — O10919 Unspecified pre-existing hypertension complicating pregnancy, unspecified trimester: Secondary | ICD-10-CM

## 2020-09-11 DIAGNOSIS — O99210 Obesity complicating pregnancy, unspecified trimester: Secondary | ICD-10-CM

## 2020-09-26 ENCOUNTER — Other Ambulatory Visit: Payer: Self-pay

## 2020-09-26 ENCOUNTER — Encounter: Payer: Self-pay | Admitting: Obstetrics & Gynecology

## 2020-09-26 ENCOUNTER — Ambulatory Visit (INDEPENDENT_AMBULATORY_CARE_PROVIDER_SITE_OTHER): Payer: BC Managed Care – PPO

## 2020-09-26 ENCOUNTER — Ambulatory Visit (INDEPENDENT_AMBULATORY_CARE_PROVIDER_SITE_OTHER): Payer: BC Managed Care – PPO | Admitting: Obstetrics & Gynecology

## 2020-09-26 VITALS — BP 120/70 | Wt 322.0 lb

## 2020-09-26 DIAGNOSIS — Z3A34 34 weeks gestation of pregnancy: Secondary | ICD-10-CM

## 2020-09-26 DIAGNOSIS — O10919 Unspecified pre-existing hypertension complicating pregnancy, unspecified trimester: Secondary | ICD-10-CM | POA: Diagnosis not present

## 2020-09-26 DIAGNOSIS — O0990 Supervision of high risk pregnancy, unspecified, unspecified trimester: Secondary | ICD-10-CM | POA: Diagnosis not present

## 2020-09-26 DIAGNOSIS — O0993 Supervision of high risk pregnancy, unspecified, third trimester: Secondary | ICD-10-CM

## 2020-09-26 DIAGNOSIS — O99210 Obesity complicating pregnancy, unspecified trimester: Secondary | ICD-10-CM

## 2020-09-26 DIAGNOSIS — O99213 Obesity complicating pregnancy, third trimester: Secondary | ICD-10-CM

## 2020-09-26 DIAGNOSIS — Z23 Encounter for immunization: Secondary | ICD-10-CM

## 2020-09-26 DIAGNOSIS — O10913 Unspecified pre-existing hypertension complicating pregnancy, third trimester: Secondary | ICD-10-CM

## 2020-09-26 LAB — POCT URINALYSIS DIPSTICK OB
Glucose, UA: NEGATIVE
POC,PROTEIN,UA: NEGATIVE

## 2020-09-26 NOTE — Patient Instructions (Signed)
Modified Biophysical Profile A modified biophysical profile is a noninvasive test that checks the health of the developing baby (fetus) and the placenta of the pregnant person. This test may be recommended when other tests show that a baby may be at high risk for certain problems. A modified biophysical profile is usually done during the last 3 months of pregnancy (third trimester). This procedure combines two tests. In one test, a device will be strapped to your belly to measure your baby's heart rate. The other test uses sound waves and a computer (ultrasound) to create an image of your baby inside your uterus. It also measures the amount of fluid (amniotic fluid) inside your uterus. Tell a health care provider about:  Any allergies you have.  All medicines you are taking, including vitamins, herbs, eye drops, creams, and over-the-counter medicines.  Any medical conditions you have, such as high blood pressure or diabetes.  Any concerns you have about your pregnancy or any pregnancy-related complications. These may include: ? Abdominal pain or contractions. ? Nausea or vomiting. ? Vaginal bleeding. ? Leaking of amniotic fluid. ? Decreased fetal movements. ? Fever or infection. ? Increased swelling. ? Headaches. ? Problems with your vision.  How often you feel your baby move. What are the risks? There are no risks to you or your baby from a modified biophysical profile. What happens before the procedure? Ask your health care provider how to prepare for this test. You may be asked to:  Drink fluids so that you have a full bladder for your ultrasound.  Eat before you arrive for the test. This makes your baby more active. What happens during the procedure?  You will lie on your back on an exam table.  A belt with a sensor will be placed around your belly to measure your baby's heart rate.  During the ultrasound, a health care provider or technician will put a small amount of gel on  your belly and gently roll a handheld device (transducer) over it. This device sends signals to a computer that measures the amniotic fluid in four areas of your uterus. The procedure may vary among health care providers and hospitals.      What can I expect after the procedure?  If your test results show a problem, you may need to have additional tests. These tests will include a full biophysical profile. This is done to make sure that your baby is doing well and getting enough food and oxygen from your placenta.  The results of your test will be used to decide on the safest type of delivery for you and whether your baby should be delivered earlier than planned.  Your health care provider will discuss your results with you.  Unless you need to stay for additional testing, you can go home right after the procedure and resume your normal activities. Where to find more information  Office on Women's Health: [place]  [employer]nec[employer]: www.acog.org Summary  A modified biophysical profile is a noninvasive test to check that your developing baby (fetus) and your placenta are healthy.  A modified biophysical profile combines two tests: a test to measure your baby's heart rate and an ultrasound test to measure the amount of fluid (amniotic fluid) inside your uterus. The ultrasound also creates an image of your baby inside your uterus.  Tell your health care provider about any concerns you have about your pregnancy or any pregnancy-related complications.  If the results of your test  show a problem, your health care provider will recommend additional testing to make sure that you and your baby are healthy. This information is not intended to replace advice given to you by your health care provider. Make sure you discuss any questions you have with your health care provider. Document Revised: 11/14/2019 Document Reviewed: 10/11/2019 Elsevier  Patient Education  2021 [employer].

## 2020-09-26 NOTE — Addendum Note (Signed)
Addended by: Quintella Baton D on: 09/26/2020 03:18 PM   Modules accepted: Orders

## 2020-09-26 NOTE — Progress Notes (Signed)
  Subjective  Fetal Movement? yes Contractions? no Leaking Fluid? no Vaginal Bleeding? no  Objective  BP 120/70   Wt (!) 322 lb (146.1 kg)   LMP 02/01/2020   BMI 47.55 kg/m  General: NAD Pumonary: no increased work of breathing Abdomen: gravid, non-tender Extremities: no edema Psychiatric: mood appropriate, affect full  Review of ULTRASOUND.    I have personally reviewed images and report of recent ultrasound done at [facility].    Plan of management to be discussed with patient. AFI normal, growth adequate, Vtx  Assessment  30 y.o. G2P1001 at [redacted]w[redacted]d by  11/07/2020, by Last Menstrual Period presenting for routine prenatal visit  Plan   Problem List Items Addressed This Visit      Cardiovascular and Mediastinum   Chronic hypertension during pregnancy, antepartum     Other   Supervision of high risk pregnancy, antepartum   Obesity affecting pregnancy   Relevant Orders   Ambulatory referral to Anesthesiology   Maternal obesity, antepartum    Other Visit Diagnoses    [redacted] weeks gestation of pregnancy    -  Primary   Relevant Orders   POC Urinalysis Dipstick OB (Completed)        Clinic [facility] Prenatal Labs  Dating EDD by LMP c/w 9w u/s Blood type: O/Positive/-- (09/18 1153)   Genetic Screen NIPS: Normal XY Inheritest: SMA, CF, Fragile-X negative 01/03/2018 Antibody:Negative (09/18 1153)  Anatomic [place] Complete 06/25/20 Rubella: 5.81 (09/18 1153)  Varicella: Immune  GTT Early: Hgb1C 5.3, 1-hr 131 Third trimester: 145 3-hr 86, 155, 146, 110 RPR: Non Reactive (09/18 1153)   Rhogam N/A HBsAg: Negative (09/18 1153)   TDaP vaccine Flu Shot Covid  09/26/20 no Pfeizer (booster as well) HIV: Non Reactive (09/18 1153)   Baby Food  Breast                               GBS:   Contraception  POP Pap: 03/10/18 NILM  CBB  no Pelvis tested to 8lbs 4oz (VAVD)  CS/VBAC N/A  [x ] anesthesia referral  Support Person Husband [name]         Obesity affecting pregnancy     Overview  Signed 09/26/2020  3:00 PM by [name], MD    Antenatal Testing [ ]  BMI 35-39.9 Weekly at 37 weeks [x ] BMI 40 or >Weekly at 34 weeks [x ] Anesthesia consult ordered today         Barnett Applebaum, MD, [facility], [facility] 09/26/2020  3:09 PM

## 2020-10-01 ENCOUNTER — Other Ambulatory Visit: Payer: BC Managed Care – PPO

## 2020-10-08 ENCOUNTER — Other Ambulatory Visit: Payer: Self-pay

## 2020-10-08 ENCOUNTER — Other Ambulatory Visit
Admission: RE | Admit: 2020-10-08 | Discharge: 2020-10-08 | Disposition: A | Discharge status: Home / Self Care | Payer: BC Managed Care – PPO | Source: Ambulatory Visit | Attending: Obstetrics & Gynecology | Admitting: Obstetrics & Gynecology

## 2020-10-08 NOTE — Consult Note (Signed)
[facility] Anesthesia Consultation  Claudia Byrd SEG:[id number] DOB: [birth year] DOA: 10/08/2020 PCP: Almetta Lovely, FNP   Requesting physician: Dr. Kenton Kingfisher  Date of consultation: 10/08/20 Reason for consultation: Obesity during pregnancy  CHIEF COMPLAINT:  Obesity during pregnancy  HISTORY OF PRESENT ILLNESS: Claudia Byrd  is a 30 y.o. female with a known history of obesity during pregancy. Prior vaginal delivery with successful epidural labor analgesia. Denies hx of cardiovascular disease. Denies hx of asthma. Denies personal or family hx of bleeding disorders.  PAST MEDICAL HISTORY:   Past Medical History:  Diagnosis Date  . Family history of breast cancer    7/21 genetic testing letter sent  . GERD (gastroesophageal reflux disease)   . Hypertension    no longer hypertensive    PAST SURGICAL HISTORY:  Past Surgical History:  Procedure Laterality Date  . COLONOSCOPY WITH PROPOFOL N/A 05/27/2019   Procedure: COLONOSCOPY WITH PROPOFOL;  Surgeon: Lucilla Lame, MD;  Location: [facility] ENDOSCOPY;  Service: Endoscopy;  Laterality: N/A;  . DIAGNOSTIC LAPAROSCOPY    . LAPAROSCOPIC OVARIAN CYSTECTOMY Right 02/24/2019   Procedure: LAPAROSCOPIC RIGHT OVARIAN CYSTECTOMY;  Surgeon: [name], MD;  Location: [facility] ORS;  Service: Gynecology;  Laterality: Right;  . TONSILLECTOMY      SOCIAL HISTORY:  Social History   Tobacco Use  . Smoking status: Never Smoker  . Smokeless tobacco: Never Used  Substance Use Topics  . Alcohol use: Never    FAMILY HISTORY:  Family History  Problem Relation Age of Onset  . Breast cancer Other 50  . Breast cancer Maternal Uncle 50    DRUG ALLERGIES:  Allergies  Allergen Reactions  . Penicillins Itching    Did it involve swelling of the face/tongue/throat, SOB, or low BP? No Did it involve sudden or severe rash/hives, skin peeling, or any reaction on the inside of your mouth or nose? No Did you need to  seek medical attention at a hospital or doctor's office? No When did it last happen?may 2020 If all above answers are "NO", may proceed with cephalosporin use.   . Sulfa Antibiotics     Unknown, childhood allergy    REVIEW OF SYSTEMS:   RESPIRATORY: No cough, shortness of breath, wheezing.  CARDIOVASCULAR: No chest pain, orthopnea, edema.  HEMATOLOGY: No anemia, easy bruising or bleeding SKIN: No rash or lesion. NEUROLOGIC: No tingling, numbness, weakness.  PSYCHIATRY: No anxiety or depression.   MEDICATIONS AT HOME:  Prior to Admission medications   Medication Sig Start Date End Date Taking? Authorizing Provider  aspirin EC 81 MG tablet Take 81 mg by mouth daily. Swallow whole.    [provider]  escitalopram (LEXAPRO) 20 MG tablet Take 1 tablet (20 mg total) by mouth daily. 08/13/20   [name], MD  loratadine (CLARITIN) 10 MG tablet Take 10 mg by mouth daily.    [provider]  omeprazole (PRILOSEC OTC) 20 MG tablet Take 20 mg by mouth daily as needed (acid reflux).    [provider]  VITAMIN D PO Take by mouth.    [provider]      PHYSICAL EXAMINATION:   VITAL SIGNS: Last menstrual period 02/01/2020, not currently breastfeeding.  GENERAL:  30 y.o.-year-old patient no acute distress.  HEENT: Head atraumatic, normocephalic. Oropharynx and nasopharynx clear. MP 2, TM distance >3 cm, normal mouth opening, grade 1 upper lip bite. LUNGS: No use of accessory muscles of respiration.   EXTREMITIES: No pedal edema, cyanosis, or clubbing.  NEUROLOGIC:  normal gait PSYCHIATRIC: The patient is alert and oriented x 3.  SKIN: No obvious rash, lesion, or ulcer.    IMPRESSION AND PLAN:   Claudia Byrd  is a 30 y.o. female presenting with obesity during pregnancy. BMI is currently 47 at [redacted] weeks gestation.   Airway exam reassuring. Spinal interspaces palpable.   We discussed analgesic options during labor including epidural  analgesia. Discussed that in obesity there can be increased difficulty with epidural placement or even failure of successful epidural. We also discussed that even after successful epidural placement there is increased risk of catheter migration out of the epidural space that would require catheter replacement. Discussed use of epidural vs spinal vs GA if cesarean delivery is required. Discussed increased risk of difficult intubation during pregnancy should an emergency cesarean delivery be required.   Plan for delivery at [facility]

## 2020-10-12 ENCOUNTER — Other Ambulatory Visit (HOSPITAL_COMMUNITY)
Admission: RE | Admit: 2020-10-12 | Discharge: 2020-10-12 | Disposition: A | Discharge status: Home / Self Care | Payer: BC Managed Care – PPO | Source: Ambulatory Visit | Attending: Obstetrics and Gynecology | Admitting: Obstetrics and Gynecology

## 2020-10-12 ENCOUNTER — Ambulatory Visit (INDEPENDENT_AMBULATORY_CARE_PROVIDER_SITE_OTHER): Payer: BC Managed Care – PPO | Admitting: Obstetrics and Gynecology

## 2020-10-12 ENCOUNTER — Encounter: Payer: Self-pay | Admitting: Obstetrics and Gynecology

## 2020-10-12 ENCOUNTER — Other Ambulatory Visit: Payer: Self-pay

## 2020-10-12 VITALS — BP 124/74 | Wt 329.0 lb

## 2020-10-12 DIAGNOSIS — O0993 Supervision of high risk pregnancy, unspecified, third trimester: Secondary | ICD-10-CM | POA: Insufficient documentation

## 2020-10-12 DIAGNOSIS — O99213 Obesity complicating pregnancy, third trimester: Secondary | ICD-10-CM | POA: Diagnosis not present

## 2020-10-12 DIAGNOSIS — O10913 Unspecified pre-existing hypertension complicating pregnancy, third trimester: Secondary | ICD-10-CM | POA: Diagnosis not present

## 2020-10-12 DIAGNOSIS — Z3A36 36 weeks gestation of pregnancy: Secondary | ICD-10-CM | POA: Insufficient documentation

## 2020-10-12 LAB — FETAL NONSTRESS TEST

## 2020-10-12 NOTE — Progress Notes (Signed)
Routine Prenatal Care Visit  Subjective  Claudia Byrd is a 30 y.o. G2P1001 at [redacted]w[redacted]d being seen today for ongoing prenatal care.  She is currently monitored for the following issues for this high-risk pregnancy and has Supervision of high risk pregnancy, antepartum; Chronic hypertension during pregnancy, antepartum; Obesity affecting pregnancy; Family history of colon cancer; and Maternal obesity, antepartum on their problem list.  ----------------------------------------------------------------------------------- Patient reports no complaints.   Contractions: Not present. Vag. Bleeding: None.  Movement: Present. Leaking Fluid denies.  ----------------------------------------------------------------------------------- The following portions of the patient's history were reviewed and updated as appropriate: allergies, current medications, past family history, past medical history, past social history, past surgical history and problem list. Problem list updated.  Objective  Blood pressure 124/74, weight (!) 329 lb (149.2 kg), last menstrual period 02/01/2020, not currently breastfeeding. Pregravid weight 270 lb (122.5 kg) Total Weight Gain 59 lb (26.8 kg) Urinalysis: Urine Protein    Urine Glucose    Fetal Status: Fetal Heart Rate (bpm): 140   Movement: Present  Presentation: Vertex  General:  Alert, oriented and cooperative. Patient is in no acute distress.  Skin: Skin is warm and dry. No rash noted.   Cardiovascular: Normal heart rate noted  Respiratory: Normal respiratory effort, no problems with respiration noted  Abdomen: Soft, gravid, appropriate for gestational age. Pain/Pressure: Absent     Pelvic:  Cervical exam deferred       samples collected from vagina for STI and GBS testing  Extremities: Normal range of motion.  Edema: None  Mental Status: Normal mood and affect. Normal behavior. Normal judgment and thought content.   Female chaperone present for pelvic exam:   Bedside  u/s: cephalic presentation, DVP 3.6 cm.    NST: Baseline FHR: 140 beats/min Variability: moderate Accelerations: present Decelerations: absent Tocometry: not done  Interpretation:  INDICATIONS: chronic hypertension and obesity in pregnnacy with BMI > 40.  RESULTS:  A NST procedure was performed with FHR monitoring and a normal baseline established, appropriate time of 20-40 minutes of evaluation, and accels >2 seen w 15x15 characteristics.  Results show a REACTIVE NST.    Assessment   30 y.o. G2P1001 at [redacted]w[redacted]d by  11/07/2020, by Last Menstrual Period presenting for routine prenatal visit  Plan   Pregnancy#2 Problems (from 02/01/20 to present)    Problem Noted Resolved   Maternal obesity, antepartum 03/28/2020 by [name], MD No   Supervision of high risk pregnancy, antepartum 03/10/2018 by [name], MD No   Overview Addendum 09/26/2020  3:13 PM by [name], MD    [facility] Prenatal Labs  Dating EDD by LMP c/w 9w u/s Blood type: O/Positive/-- (09/18 1153)   Genetic Screen NIPS: Normal XY Inheritest: SMA, CF, Fragile-X negative 01/03/2018 Antibody:Negative (09/18 1153)  Anatomic [place] Complete 06/25/20 Rubella: 5.81 (09/18 1153)  Varicella: Immune  GTT Early: Hgb1C 5.3, 1-hr 131 Third trimester: 145 3-hr 86, 155, 146, 110 RPR: Non Reactive (09/18 1153)   Rhogam N/A HBsAg: Negative (09/18 1153)   TDaP vaccine Flu Shot Covid  09/26/20  05/04/21 Pfeizer (booster as well) HIV: Non Reactive (09/18 1153)   Baby Food  Breast                               GBS:   Contraception  POP Pap: 03/10/18 NILM  CBB  no Pelvis tested to 8lbs 4oz (VAVD)  CS/VBAC N/A  [ placed 09/26/20] anesthesia referral  Support  Person Husband [name]           Previous Version   Chronic hypertension during pregnancy, antepartum 03/10/2018 by [name], MD No   Overview Addendum 03/28/2020 10:03 AM by [name], MD    [ ]  Aspirin 81 mg daily after 12 weeks; discontinue after 36  weeks [X]  baseline labs with CBC, CMP, urine protein/creatinine ratio [ ]  no BP meds unless BPs become elevated [ ]  ultrasound for growth at 28, 32, 36 weeks    Current antihypertensives:  None   Baseline and surveillance labs (pulled in from [facility], refresh links as needed)  Lab Results  Component Value Date   PLT 379 03/10/2018   CREATININE 0.70 03/10/2018   AST 18 03/10/2018   ALT 10 03/10/2018    Antenatal Testing CHTN - O10.919  Group I  BP < 140/90, no preeclampsia, AGA,  nml AFV, +/- meds    Group II BP > 140/90, on meds, no preeclampsia, AGA, nml AFV  20-28-34-38  20-24-28-32-35-38  32//2 x wk  28//BPP wkly then 32//2 x wk  40 no meds; 39 meds  PRN or 37  Pre-eclampsia  GHTN - O13.9/Preeclampsia without severe features  - O14.00   Preeclampsia with severe features - O14.10  Q 3-4wks  Q 2 wks  28//BPP wkly then 32//2 x wk  Inpatient  37  PRN or 34        Previous Version   Obesity affecting pregnancy 03/10/2018 by [name], MD No   Overview Signed 09/26/2020  3:00 PM by [name], MD    Antenatal Testing [ ]  BMI 35-39.9 Weekly at 37 weeks [x ] BMI 40 or >Weekly at 34 weeks           Preterm labor symptoms and general obstetric precautions including but not limited to vaginal bleeding, contractions, leaking of fluid and fetal movement were reviewed in detail with the patient. Please refer to After Visit Summary for other counseling recommendations.   - twice weekly visits with NST, once per week with AFI - trying to arrange another growth u/s in 2 weeks.  Return in about 4 days (around 10/16/2020) for Routine Prenatal Appointment/NST (1 week for ROB/NST) (MD only).   Prentice Docker, MD, [facility], [facility] 10/12/2020 4:41 PM

## 2020-10-15 ENCOUNTER — Ambulatory Visit (INDEPENDENT_AMBULATORY_CARE_PROVIDER_SITE_OTHER): Payer: BC Managed Care – PPO | Admitting: Obstetrics and Gynecology

## 2020-10-15 ENCOUNTER — Telehealth: Payer: Self-pay

## 2020-10-15 ENCOUNTER — Other Ambulatory Visit: Payer: Self-pay

## 2020-10-15 ENCOUNTER — Encounter: Payer: Self-pay | Admitting: Obstetrics and Gynecology

## 2020-10-15 VITALS — BP 122/74 | Wt 327.0 lb

## 2020-10-15 DIAGNOSIS — O0993 Supervision of high risk pregnancy, unspecified, third trimester: Secondary | ICD-10-CM

## 2020-10-15 DIAGNOSIS — O10913 Unspecified pre-existing hypertension complicating pregnancy, third trimester: Secondary | ICD-10-CM | POA: Diagnosis not present

## 2020-10-15 DIAGNOSIS — O99213 Obesity complicating pregnancy, third trimester: Secondary | ICD-10-CM

## 2020-10-15 DIAGNOSIS — Z3A36 36 weeks gestation of pregnancy: Secondary | ICD-10-CM

## 2020-10-15 LAB — FETAL NONSTRESS TEST

## 2020-10-15 NOTE — Progress Notes (Signed)
Routine Prenatal Care Visit  Subjective  Claudia Byrd is a 30 y.o. G2P1001 at [redacted]w[redacted]d being seen today for ongoing prenatal care.  She is currently monitored for the following issues for this high-risk pregnancy and has Supervision of high risk pregnancy, antepartum; Chronic hypertension during pregnancy, antepartum; Obesity affecting pregnancy; Family history of colon cancer; and Maternal obesity, antepartum on their problem list.  ----------------------------------------------------------------------------------- Patient reports no complaints.   Contractions: Not present. Vag. Bleeding: None.  Movement: Present. Leaking Fluid denies.  ----------------------------------------------------------------------------------- The following portions of the patient's history were reviewed and updated as appropriate: allergies, current medications, past family history, past medical history, past social history, past surgical history and problem list. Problem list updated.  Objective  Blood pressure 122/74, weight (!) 327 lb (148.3 kg), last menstrual period 02/01/2020, not currently breastfeeding. Pregravid weight 270 lb (122.5 kg) Total Weight Gain 57 lb (25.9 kg) Urinalysis: Urine Protein    Urine Glucose    Fetal Status: Fetal Heart Rate (bpm): 135   Movement: Present     General:  Alert, oriented and cooperative. Patient is in no acute distress.  Skin: Skin is warm and dry. No rash noted.   Cardiovascular: Normal heart rate noted  Respiratory: Normal respiratory effort, no problems with respiration noted  Abdomen: Soft, gravid, appropriate for gestational age. Pain/Pressure: Absent     Pelvic:  Cervical exam deferred        Extremities: Normal range of motion.  Edema: None  Mental Status: Normal mood and affect. Normal behavior. Normal judgment and thought content.   NST: Baseline FHR: 135 beats/min Variability: moderate Accelerations: present Decelerations: absent Tocometry: not  done  Interpretation:  INDICATIONS: chronic hypertension and maternal morbid obesity RESULTS:  A NST procedure was performed with FHR monitoring and a normal baseline established, appropriate time of 20-40 minutes of evaluation, and accels >2 seen w 15x15 characteristics.  Results show a REACTIVE NST.    Assessment   30 y.o. G2P1001 at [redacted]w[redacted]d by  11/07/2020, by Last Menstrual Period presenting for routine prenatal visit  Plan   Pregnancy#2 Problems (from 02/01/20 to present)    Problem Noted Resolved   Maternal obesity, antepartum 03/28/2020 by [name], MD No   Supervision of high risk pregnancy, antepartum 03/10/2018 by [name], MD No   Overview Addendum 09/26/2020  3:13 PM by [name], MD    [facility] Prenatal Labs  Dating EDD by LMP c/w 9w u/s Blood type: O/Positive/-- (09/18 1153)   Genetic Screen NIPS: Normal XY Inheritest: SMA, CF, Fragile-X negative 01/03/2018 Antibody:Negative (09/18 1153)  Anatomic [place] Complete 06/25/20 Rubella: 5.81 (09/18 1153)  Varicella: Immune  GTT Early: Hgb1C 5.3, 1-hr 131 Third trimester: 145 3-hr 86, 155, 146, 110 RPR: Non Reactive (09/18 1153)   Rhogam N/A HBsAg: Negative (09/18 1153)   TDaP vaccine Flu Shot Covid  09/26/20  05/04/21 Pfeizer (booster as well) HIV: Non Reactive (09/18 1153)   Baby Food  Breast                               GBS:   Contraception  POP Pap: 03/10/18 NILM  CBB  no Pelvis tested to 8lbs 4oz (VAVD)  CS/VBAC N/A  [ placed 09/26/20] anesthesia referral  Support Person Husband [name]           Previous Version   Chronic hypertension during pregnancy, antepartum 03/10/2018 by [name], MD No   Overview Addendum  03/28/2020 10:03 AM by [name], MD    [ ]  Aspirin 81 mg daily after 12 weeks; discontinue after 36 weeks [X]  baseline labs with CBC, CMP, urine protein/creatinine ratio [ ]  no BP meds unless BPs become elevated [ ]  ultrasound for growth at 28, 32, 36 weeks    Current  antihypertensives:  None   Baseline and surveillance labs (pulled in from [facility], refresh links as needed)  Lab Results  Component Value Date   PLT 379 03/10/2018   CREATININE 0.70 03/10/2018   AST 18 03/10/2018   ALT 10 03/10/2018    Antenatal Testing CHTN - O10.919  Group I  BP < 140/90, no preeclampsia, AGA,  nml AFV, +/- meds    Group II BP > 140/90, on meds, no preeclampsia, AGA, nml AFV  20-28-34-38  20-24-28-32-35-38  32//2 x wk  28//BPP wkly then 32//2 x wk  40 no meds; 39 meds  PRN or 37  Pre-eclampsia  GHTN - O13.9/Preeclampsia without severe features  - O14.00   Preeclampsia with severe features - O14.10  Q 3-4wks  Q 2 wks  28//BPP wkly then 32//2 x wk  Inpatient  37  PRN or 34        Previous Version   Obesity affecting pregnancy 03/10/2018 by [name], MD No   Overview Signed 09/26/2020  3:00 PM by [name], MD    Antenatal Testing [ ]  BMI 35-39.9 Weekly at 37 weeks [x ] BMI 40 or >Weekly at 34 weeks           Preterm labor symptoms and general obstetric precautions including but not limited to vaginal bleeding, contractions, leaking of fluid and fetal movement were reviewed in detail with the patient. Please refer to After Visit Summary for other counseling recommendations.   Return for keep previously schedule appts .   Prentice Docker, MD, [facility], [facility] 10/15/2020 5:01 PM

## 2020-10-16 ENCOUNTER — Ambulatory Visit (INDEPENDENT_AMBULATORY_CARE_PROVIDER_SITE_OTHER): Payer: BC Managed Care – PPO | Admitting: Obstetrics

## 2020-10-16 ENCOUNTER — Other Ambulatory Visit: Payer: Self-pay | Admitting: Obstetrics and Gynecology

## 2020-10-16 ENCOUNTER — Telehealth: Payer: Self-pay

## 2020-10-16 ENCOUNTER — Encounter: Payer: BC Managed Care – PPO | Admitting: Obstetrics and Gynecology

## 2020-10-16 VITALS — BP 132/82 | Ht 69.0 in | Wt 331.2 lb

## 2020-10-16 DIAGNOSIS — O0993 Supervision of high risk pregnancy, unspecified, third trimester: Secondary | ICD-10-CM

## 2020-10-16 DIAGNOSIS — Z3A36 36 weeks gestation of pregnancy: Secondary | ICD-10-CM

## 2020-10-16 DIAGNOSIS — O99213 Obesity complicating pregnancy, third trimester: Secondary | ICD-10-CM

## 2020-10-16 DIAGNOSIS — O0990 Supervision of high risk pregnancy, unspecified, unspecified trimester: Secondary | ICD-10-CM

## 2020-10-16 DIAGNOSIS — O10919 Unspecified pre-existing hypertension complicating pregnancy, unspecified trimester: Secondary | ICD-10-CM

## 2020-10-16 LAB — CERVICOVAGINAL ANCILLARY ONLY
Chlamydia: NEGATIVE
Comment: NEGATIVE
Comment: NORMAL
Neisseria Gonorrhea: NEGATIVE

## 2020-10-16 LAB — POCT URINALYSIS DIPSTICK OB
Glucose, UA: NEGATIVE
POC,PROTEIN,UA: NEGATIVE

## 2020-10-16 LAB — STREP GP B CULTURE+RFLX: Strep Gp B Culture+Rflx: NEGATIVE

## 2020-10-16 NOTE — Telephone Encounter (Signed)
Pt calling; is [redacted]w[redacted]d; feels like she is leaking fluid a little bit at a time; come in and be checked or wait for appt Thurs.?  [phone]  Pt has panty  Liner on and cannot stay dry; no ctxs.  Tx to [facility] for scheduling today.

## 2020-10-16 NOTE — Progress Notes (Signed)
Routine Prenatal Care Visit  Subjective  Claudia Byrd is a 30 y.o. G2P1001 at [redacted]w[redacted]d being seen today for ongoing prenatal care.  She is currently monitored for the following issues for this high-risk pregnancy and has Supervision of high risk pregnancy, antepartum; Chronic hypertension during pregnancy, antepartum; Obesity affecting pregnancy; Family history of colon cancer; and Maternal obesity, antepartum on their problem list.  ----------------------------------------------------------------------------------- Patient reports no bleeding, no contractions, no cramping and and she is worked in today for increased vaginal discharge. she normally wears a apnty liner, but has noticed increased white discharge that is not itchy.   Contractions: Not present. Vag. Bleeding: None.  Movement: Present. Leaking Fluid she is not leaking, but feels more discharge.  ----------------------------------------------------------------------------------- The following portions of the patient's history were reviewed and updated as appropriate: allergies, current medications, past family history, past medical history, past social history, past surgical history and problem list. Problem list updated.  Objective  Blood pressure 132/82, height 5\' 9"  (1.753 m), weight (!) 331 lb 3.2 oz (150.2 kg), last menstrual period 02/01/2020, not currently breastfeeding. Pregravid weight 270 lb (122.5 kg) Total Weight Gain 61 lb 3.2 oz (27.8 kg) Urinalysis: Urine Protein    Urine Glucose    Fetal Status:     Movement: Present     General:  Alert, oriented and cooperative. Patient is in no acute distress.  Skin: Skin is warm and dry. No rash noted.   Cardiovascular: Normal heart rate noted  Respiratory: Normal respiratory effort, no problems with respiration noted  Abdomen: Soft, gravid, appropriate for gestational age. Pain/Pressure: Absent     Pelvic:  Cervical exam not performed        Extremities: Normal range of motion.      Mental Status: Normal mood and affect. Normal behavior. Normal judgment and thought content.   Assessment   30 y.o. G2P1001 at [redacted]w[redacted]d by  11/07/2020, by Last Menstrual Period presenting for work-in prenatal visit  Plan   Pregnancy#2 Problems (from 02/01/20 to present)    Problem Noted Resolved   Maternal obesity, antepartum 03/28/2020 by [name], MD No   Supervision of high risk pregnancy, antepartum 03/10/2018 by [name], MD No   Overview Addendum 09/26/2020  3:13 PM by [name], MD    [facility]inic [facility] Prenatal Labs  Dating EDD by LMP c/w 9w u/s Blood type: O/Positive/-- (09/18 1153)   Genetic Screen NIPS: Normal XY Inheritest: SMA, CF, Fragile-X negative 01/03/2018 Antibody:Negative (09/18 1153)  Anatomic [place] Complete 06/25/20 Rubella: 5.81 (09/18 1153)  Varicella: Immune  GTT Early: Hgb1C 5.3, 1-hr 131 Third trimester: 145 3-hr 86, 155, 146, 110 RPR: Non Reactive (09/18 1153)   Rhogam N/A HBsAg: Negative (09/18 1153)   TDaP vaccine Flu Shot Covid  09/26/20  05/04/21 Pfeizer (booster as well) HIV: Non Reactive (09/18 1153)   Baby Food  Breast                               GBS:   Contraception  POP Pap: 03/10/18 NILM  CBB  no Pelvis tested to 8lbs 4oz (VAVD)  CS/VBAC N/A  [ placed 09/26/20] anesthesia referral  Support Person Husband [name]           Previous Version   Chronic hypertension during pregnancy, antepartum 03/10/2018 by [name], MD No   Overview Addendum 03/28/2020 10:03 AM by [name], MD    [ ]  Aspirin 81 mg daily after  12 weeks; discontinue after 36 weeks [X]  baseline labs with CBC, CMP, urine protein/creatinine ratio [ ]  no BP meds unless BPs become elevated [ ]  ultrasound for growth at 28, 32, 36 weeks    Current antihypertensives:  None   Baseline and surveillance labs (pulled in from [facility], refresh links as needed)  Lab Results  Component Value Date   PLT 379 03/10/2018   CREATININE 0.70 03/10/2018   AST 18  03/10/2018   ALT 10 03/10/2018    Antenatal Testing CHTN - O10.919  Group I  BP < 140/90, no preeclampsia, AGA,  nml AFV, +/- meds    Group II BP > 140/90, on meds, no preeclampsia, AGA, nml AFV  20-28-34-38  20-24-28-32-35-38  32//2 x wk  28//BPP wkly then 32//2 x wk  40 no meds; 39 meds  PRN or 37  Pre-eclampsia  GHTN - O13.9/Preeclampsia without severe features  - O14.00   Preeclampsia with severe features - O14.10  Q 3-4wks  Q 2 wks  28//BPP wkly then 32//2 x wk  Inpatient  37  PRN or 34        Previous Version   Obesity affecting pregnancy 03/10/2018 by [name], MD No   Overview Signed 09/26/2020  3:00 PM by [name], MD    Antenatal Testing [ ]  BMI 35-39.9 Weekly at 37 weeks [x ] BMI 40 or >Weekly at 34 weeks           Preterm labor symptoms and general obstetric precautions including but not limited to vaginal bleeding, contractions, leaking of fluid and fetal movement were reviewed in detail with the patient. Please refer to After Visit Summary for other counseling recommendations.  Spec exam  Performed shows no fluid leaking, some leukkorhea. She is reassured by today's findings.   No follow-ups on file.  Imagene Riches, CNM  10/16/2020 5:18 PM

## 2020-10-18 ENCOUNTER — Ambulatory Visit (INDEPENDENT_AMBULATORY_CARE_PROVIDER_SITE_OTHER): Payer: BC Managed Care – PPO | Admitting: Obstetrics and Gynecology

## 2020-10-18 ENCOUNTER — Other Ambulatory Visit: Payer: Self-pay

## 2020-10-18 ENCOUNTER — Other Ambulatory Visit: Payer: Self-pay | Admitting: Obstetrics and Gynecology

## 2020-10-18 VITALS — BP 121/82 | Wt 327.0 lb

## 2020-10-18 DIAGNOSIS — Z3A37 37 weeks gestation of pregnancy: Secondary | ICD-10-CM | POA: Diagnosis not present

## 2020-10-18 DIAGNOSIS — O0993 Supervision of high risk pregnancy, unspecified, third trimester: Secondary | ICD-10-CM

## 2020-10-18 DIAGNOSIS — O99213 Obesity complicating pregnancy, third trimester: Secondary | ICD-10-CM

## 2020-10-18 DIAGNOSIS — O0990 Supervision of high risk pregnancy, unspecified, unspecified trimester: Secondary | ICD-10-CM

## 2020-10-18 DIAGNOSIS — O10919 Unspecified pre-existing hypertension complicating pregnancy, unspecified trimester: Secondary | ICD-10-CM

## 2020-10-18 DIAGNOSIS — O10913 Unspecified pre-existing hypertension complicating pregnancy, third trimester: Secondary | ICD-10-CM

## 2020-10-18 LAB — POCT URINALYSIS DIPSTICK OB
Glucose, UA: NEGATIVE
POC,PROTEIN,UA: NEGATIVE

## 2020-10-18 NOTE — Addendum Note (Signed)
Addended by: Prentice Docker D on: 10/18/2020 04:00 PM   Modules accepted: Orders

## 2020-10-18 NOTE — Progress Notes (Signed)
Routine Prenatal Care Visit  Subjective  Claudia Byrd is a 30 y.o. G2P1001 at [redacted]w[redacted]d being seen today for ongoing prenatal care.  She is currently monitored for the following issues for this low-risk pregnancy and has Supervision of high risk pregnancy, antepartum; Chronic hypertension during pregnancy, antepartum; Obesity affecting pregnancy; Family history of colon cancer; and Maternal obesity, antepartum on their problem list.  ----------------------------------------------------------------------------------- Patient reports no complaints.   Contractions: Not present. Vag. Bleeding: None.  Movement: Present. Denies leaking of fluid.  ----------------------------------------------------------------------------------- The following portions of the patient's history were reviewed and updated as appropriate: allergies, current medications, past family history, past medical history, past social history, past surgical history and problem list. Problem list updated.   Objective  Blood pressure 121/82, weight (!) 327 lb (148.3 kg), last menstrual period 02/01/2020, not currently breastfeeding. Pregravid weight 270 lb (122.5 kg) Total Weight Gain 57 lb (25.9 kg) Urinalysis:      Fetal Status: Fetal Heart Rate (bpm): 140   Movement: Present  Presentation: Vertex  General:  Alert, oriented and cooperative. Patient is in no acute distress.  Skin: Skin is warm and dry. No rash noted.   Cardiovascular: Normal heart rate noted  Respiratory: Normal respiratory effort, no problems with respiration noted  Abdomen: Soft, gravid, appropriate for gestational age. Pain/Pressure: Absent     Pelvic:  Cervical exam deferred        Extremities: Normal range of motion.     ental Status: Normal mood and affect. Normal behavior. Normal judgment and thought content.   Bedside AFI: 17.83cm  Baseline: 140 Variability: moderate Accelerations: present Decelerations: absent Tocometry: N/A The patient was  monitored for 30 minutes, fetal heart rate tracing was deemed reactive, category I tracing,  CPT [id number]   Assessment   30 y.o. G2P1001 at [redacted]w[redacted]d by  11/07/2020, by Last Menstrual Period presenting for routine prenatal visit  Plan   Pregnancy#2 Problems (from 02/01/20 to present)    Problem Noted Resolved   Maternal obesity, antepartum 03/28/2020 by [name], MD No   Supervision of high risk pregnancy, antepartum 03/10/2018 by [name], MD No   Overview Addendum 09/26/2020  3:13 PM by [name], MD    [facility] Prenatal Labs  Dating EDD by LMP c/w 9w u/s Blood type: O/Positive/-- (09/18 1153)   Genetic Screen NIPS: Normal XY Inheritest: SMA, CF, Fragile-X negative 01/03/2018 Antibody:Negative (09/18 1153)  Anatomic [place] Complete 06/25/20 Rubella: 5.81 (09/18 1153)  Varicella: Immune  GTT Early: Hgb1C 5.3, 1-hr 131 Third trimester: 145 3-hr 86, 155, 146, 110 RPR: Non Reactive (09/18 1153)   Rhogam N/A HBsAg: Negative (09/18 1153)   TDaP vaccine Flu Shot Covid  09/26/20  05/04/21 Pfeizer (booster as well) HIV: Non Reactive (09/18 1153)   Baby Food  Breast                               GBS:   Contraception  POP Pap: 03/10/18 NILM  CBB  no Pelvis tested to 8lbs 4oz (VAVD)  CS/VBAC N/A  [ placed 09/26/20] anesthesia referral  Support Person Husband [name]           Previous Version   Chronic hypertension during pregnancy, antepartum 03/10/2018 by [name], MD No   Overview Addendum 03/28/2020 10:03 AM by [name], MD    [ ]  Aspirin 81 mg daily after 12 weeks; discontinue after 36 weeks [X]  baseline labs with  CBC, CMP, urine protein/creatinine ratio [ ]  no BP meds unless BPs become elevated [ ]  ultrasound for growth at 28, 32, 36 weeks    Current antihypertensives:  None   Baseline and surveillance labs (pulled in from [facility], refresh links as needed)  Lab Results  Component Value Date   PLT 379 03/10/2018   CREATININE 0.70 03/10/2018   AST  18 03/10/2018   ALT 10 03/10/2018    Antenatal Testing CHTN - O10.919  Group I  BP < 140/90, no preeclampsia, AGA,  nml AFV, +/- meds    Group II BP > 140/90, on meds, no preeclampsia, AGA, nml AFV  20-28-34-38  20-24-28-32-35-38  32//2 x wk  28//BPP wkly then 32//2 x wk  40 no meds; 39 meds  PRN or 37  Pre-eclampsia  GHTN - O13.9/Preeclampsia without severe features  - O14.00   Preeclampsia with severe features - O14.10  Q 3-4wks  Q 2 wks  28//BPP wkly then 32//2 x wk  Inpatient  37  PRN or 34        Previous Version   Obesity affecting pregnancy 03/10/2018 by [name], MD No   Overview Signed 09/26/2020  3:00 PM by [name], MD    Antenatal Testing [ ]  BMI 35-39.9 Weekly at 37 weeks [x ] BMI 40 or >Weekly at [redacted] weeks           Gestational age appropriate obstetric precautions including but not limited to vaginal bleeding, contractions, leaking of fluid and fetal movement were reviewed in detail with the patient.    1) CHTN - BP remain well controlled no meds  2) Obesity - reactive NST normal AFI today - has growth scan scheduled next week  3) Discussed IOL in 39th week vs expectant management  Return in about 1 week (around 10/25/2020) for ROB / NST.  [name], MD, [facility], [facility] 10/18/2020, 2:33 PM

## 2020-10-22 ENCOUNTER — Other Ambulatory Visit: Payer: Self-pay

## 2020-10-22 ENCOUNTER — Encounter: Payer: Self-pay | Admitting: Obstetrics and Gynecology

## 2020-10-22 ENCOUNTER — Encounter: Payer: Self-pay | Admitting: *Deleted

## 2020-10-22 ENCOUNTER — Ambulatory Visit: Payer: BC Managed Care – PPO | Attending: Obstetrics and Gynecology

## 2020-10-22 ENCOUNTER — Ambulatory Visit (INDEPENDENT_AMBULATORY_CARE_PROVIDER_SITE_OTHER): Payer: BC Managed Care – PPO | Admitting: Obstetrics and Gynecology

## 2020-10-22 ENCOUNTER — Ambulatory Visit (HOSPITAL_BASED_OUTPATIENT_CLINIC_OR_DEPARTMENT_OTHER): Payer: BC Managed Care – PPO | Admitting: *Deleted

## 2020-10-22 VITALS — BP 128/84 | Wt 329.0 lb

## 2020-10-22 DIAGNOSIS — O10913 Unspecified pre-existing hypertension complicating pregnancy, third trimester: Secondary | ICD-10-CM

## 2020-10-22 DIAGNOSIS — O99213 Obesity complicating pregnancy, third trimester: Secondary | ICD-10-CM

## 2020-10-22 DIAGNOSIS — O3663X0 Maternal care for excessive fetal growth, third trimester, not applicable or unspecified: Secondary | ICD-10-CM

## 2020-10-22 DIAGNOSIS — O10919 Unspecified pre-existing hypertension complicating pregnancy, unspecified trimester: Secondary | ICD-10-CM | POA: Diagnosis present

## 2020-10-22 DIAGNOSIS — O99210 Obesity complicating pregnancy, unspecified trimester: Secondary | ICD-10-CM | POA: Insufficient documentation

## 2020-10-22 DIAGNOSIS — Z3A37 37 weeks gestation of pregnancy: Secondary | ICD-10-CM | POA: Insufficient documentation

## 2020-10-22 DIAGNOSIS — O0990 Supervision of high risk pregnancy, unspecified, unspecified trimester: Secondary | ICD-10-CM | POA: Diagnosis present

## 2020-10-22 DIAGNOSIS — O10013 Pre-existing essential hypertension complicating pregnancy, third trimester: Secondary | ICD-10-CM | POA: Diagnosis not present

## 2020-10-22 DIAGNOSIS — O0993 Supervision of high risk pregnancy, unspecified, third trimester: Secondary | ICD-10-CM | POA: Diagnosis present

## 2020-10-22 DIAGNOSIS — Z363 Encounter for antenatal screening for malformations: Secondary | ICD-10-CM

## 2020-10-22 DIAGNOSIS — E669 Obesity, unspecified: Secondary | ICD-10-CM

## 2020-10-22 NOTE — Progress Notes (Signed)
Routine Prenatal Care Visit  Subjective  Claudia Byrd is a 30 y.o. G2P1001 at [redacted]w[redacted]d being seen today for ongoing prenatal care.  She is currently monitored for the following issues for this high-risk pregnancy and has Supervision of high risk pregnancy, antepartum; Chronic hypertension during pregnancy, antepartum; Obesity affecting pregnancy; Family history of colon cancer; and Maternal obesity, antepartum on their problem list.  ----------------------------------------------------------------------------------- Patient reports no complaints.   Contractions: Not present. Vag. Bleeding: None.  Movement: Present. Leaking Fluid denies.  Growth u/s today 99th %ile (4,087 grams), recommended IOL at or around 39 weeks. ----------------------------------------------------------------------------------- The following portions of the patient's history were reviewed and updated as appropriate: allergies, current medications, past family history, past medical history, past social history, past surgical history and problem list. Problem list updated.  Objective  Blood pressure 128/84, weight (!) 329 lb (149.2 kg), last menstrual period 02/01/2020, not currently breastfeeding. Pregravid weight 270 lb (122.5 kg) Total Weight Gain 59 lb (26.8 kg) Urinalysis: Urine Protein    Urine Glucose    Fetal Status: Fetal Heart Rate (bpm): 140   Movement: Present  Presentation: Vertex  General:  Alert, oriented and cooperative. Patient is in no acute distress.  Skin: Skin is warm and dry. No rash noted.   Cardiovascular: Normal heart rate noted  Respiratory: Normal respiratory effort, no problems with respiration noted  Abdomen: Soft, gravid, appropriate for gestational age. Pain/Pressure: Absent     Pelvic:  Cervical exam deferred        Extremities: Normal range of motion.  Edema: None  Mental Status: Normal mood and affect. Normal behavior. Normal judgment and thought content.   NST Baseline FHR: 140  beats/min Variability: moderate Accelerations: present Decelerations: absent Tocometry: not done  Interpretation:  INDICATIONS: obesity in pregnancy RESULTS:  A NST procedure was performed with FHR monitoring and a normal baseline established, appropriate time of 20-40 minutes of evaluation, and accels >2 seen w 15x15 characteristics.  Results show a REACTIVE NST.    Assessment   30 y.o. G2P1001 at [redacted]w[redacted]d by  11/07/2020, by Last Menstrual Period presenting for routine prenatal visit  Plan   Pregnancy#2 Problems (from 02/01/20 to present)    Problem Noted Resolved   Maternal obesity, antepartum 03/28/2020 by [name], MD No   Supervision of high risk pregnancy, antepartum 03/10/2018 by [name], MD No   Overview Addendum 09/26/2020  3:13 PM by [name], MD    [facility] Prenatal Labs  Dating EDD by LMP c/w 9w u/s Blood type: O/Positive/-- (09/18 1153)   Genetic Screen NIPS: Normal XY Inheritest: SMA, CF, Fragile-X negative 01/03/2018 Antibody:Negative (09/18 1153)  Anatomic [place] Complete 06/25/20 Rubella: 5.81 (09/18 1153)  Varicella: Immune  GTT Early: Hgb1C 5.3, 1-hr 131 Third trimester: 145 3-hr 86, 155, 146, 110 RPR: Non Reactive (09/18 1153)   Rhogam N/A HBsAg: Negative (09/18 1153)   TDaP vaccine Flu Shot Covid  09/26/20  05/04/21 Pfeizer (booster as well) HIV: Non Reactive (09/18 1153)   Baby Food  Breast                               GBS:   Contraception  POP Pap: 03/10/18 NILM  CBB  no Pelvis tested to 8lbs 4oz (VAVD)  CS/VBAC N/A  [ placed 09/26/20] anesthesia referral  Support Person Husband [name]           Previous Version   Chronic hypertension during pregnancy,  antepartum 03/10/2018 by [name], MD No   Overview Addendum 03/28/2020 10:03 AM by [name], MD    [ ]  Aspirin 81 mg daily after 12 weeks; discontinue after 36 weeks [X]  baseline labs with CBC, CMP, urine protein/creatinine ratio [ ]  no BP meds unless BPs become  elevated [ ]  ultrasound for growth at 28, 32, 36 weeks    Current antihypertensives:  None   Baseline and surveillance labs (pulled in from [facility], refresh links as needed)  Lab Results  Component Value Date   PLT 379 03/10/2018   CREATININE 0.70 03/10/2018   AST 18 03/10/2018   ALT 10 03/10/2018    Antenatal Testing CHTN - O10.919  Group I  BP < 140/90, no preeclampsia, AGA,  nml AFV, +/- meds    Group II BP > 140/90, on meds, no preeclampsia, AGA, nml AFV  20-28-34-38  20-24-28-32-35-38  32//2 x wk  28//BPP wkly then 32//2 x wk  40 no meds; 39 meds  PRN or 37  Pre-eclampsia  GHTN - O13.9/Preeclampsia without severe features  - O14.00   Preeclampsia with severe features - O14.10  Q 3-4wks  Q 2 wks  28//BPP wkly then 32//2 x wk  Inpatient  37  PRN or 34        Previous Version   Obesity affecting pregnancy 03/10/2018 by [name], MD No   Overview Signed 09/26/2020  3:00 PM by [name], MD    Antenatal Testing [ ]  BMI 35-39.9 Weekly at 37 weeks [x ] BMI 40 or >Weekly at 34 weeks           Term labor symptoms and general obstetric precautions including but not limited to vaginal bleeding, contractions, leaking of fluid and fetal movement were reviewed in detail with the patient. Please refer to After Visit Summary for other counseling recommendations.   - IOL 5/13 at 0800  Return in about 1 week (around 10/29/2020) for Routine Prenatal Appointment/NST.   Prentice Docker, MD, [facility], [place] 10/22/2020 4:01 PM

## 2020-10-22 NOTE — Progress Notes (Signed)
MFM Consult Note  Claudia Byrd was seen for an ultrasound exam today due to suspected fetal macrosomia.  The patient reports that her fundal heights have been measuring larger than her gestational age.  She has screened negative for diabetes in her current pregnancy and denies any problems in her current pregnancy.  She reports that her first child was delivered at 39+ weeks weighing 8 pounds 4 ounces.  She had to undergo a vacuum-assisted vaginal delivery as she was pushing for a prolonged period of time.  She was informed that the fetal growth measures at the 99th percentile for her gestational age (9 pounds, 4087 g).  There was normal amniotic fluid noted today.  The views of the fetal anatomy were limited today due to her advanced gestational age.  Based on ACOG recommendations, as she does not have gestational diabetes, she may attempt a vaginal delivery if the EFW measures less than 5000 g.  The increased risk of shoulder dystocia due to the larger sized fetus was discussed.  The limitations of ultrasound to predict a shoulder dystocia should a vaginal delivery be attempted was also discussed.    As she already has a prior vaginal delivery and the EFW obtained today is less than 5000 g, she should be scheduled for an induction at around 39 weeks (next week).    I would have a low threshold for a cesarean delivery should there be any abnormalities in her labor progression or should there be any prolongation of the second stage of labor.  The patient stated that she was comfortable and happy with this management plan.    She also stated that all of her questions had been answered to her complete satisfaction.  A total of 30 minutes was spent counseling and coordinating the care for this patient.  Greater than 50% of the time was spent in direct face-to-face contact.

## 2020-10-25 ENCOUNTER — Other Ambulatory Visit: Payer: Self-pay

## 2020-10-25 ENCOUNTER — Ambulatory Visit (INDEPENDENT_AMBULATORY_CARE_PROVIDER_SITE_OTHER): Payer: BC Managed Care – PPO | Admitting: Obstetrics and Gynecology

## 2020-10-25 ENCOUNTER — Encounter: Payer: Self-pay | Admitting: Obstetrics and Gynecology

## 2020-10-25 VITALS — BP 120/79 | HR 97 | Ht 69.0 in | Wt 333.6 lb

## 2020-10-25 DIAGNOSIS — O10913 Unspecified pre-existing hypertension complicating pregnancy, third trimester: Secondary | ICD-10-CM

## 2020-10-25 DIAGNOSIS — Z3A38 38 weeks gestation of pregnancy: Secondary | ICD-10-CM | POA: Diagnosis not present

## 2020-10-25 DIAGNOSIS — O99213 Obesity complicating pregnancy, third trimester: Secondary | ICD-10-CM

## 2020-10-25 DIAGNOSIS — O0993 Supervision of high risk pregnancy, unspecified, third trimester: Secondary | ICD-10-CM

## 2020-10-25 NOTE — Progress Notes (Signed)
Routine Prenatal Care Visit  Subjective  Claudia Byrd is a 30 y.o. G2P1001 at [redacted]w[redacted]d being seen today for ongoing prenatal care.  She is currently monitored for the following issues for this high-risk pregnancy and has Supervision of high risk pregnancy, antepartum; Chronic hypertension during pregnancy, antepartum; Obesity affecting pregnancy; Family history of colon cancer; and Maternal obesity, antepartum on their problem list.  ----------------------------------------------------------------------------------- Patient reports no complaints.   Contractions: Not present. Vag. Bleeding: None.  Movement: Present. Leaking Fluid denies.  ----------------------------------------------------------------------------------- The following portions of the patient's history were reviewed and updated as appropriate: allergies, current medications, past family history, past medical history, past social history, past surgical history and problem list. Problem list updated.  Objective  Blood pressure 120/79, pulse 97, height 5\' 9"  (1.753 m), weight (!) 333 lb 9.6 oz (151.3 kg), last menstrual period 02/01/2020, not currently breastfeeding. Pregravid weight 270 lb (122.5 kg) Total Weight Gain 63 lb 9.6 oz (28.8 kg) Urinalysis: Urine Protein    Urine Glucose    Fetal Status: Fetal Heart Rate (bpm): 130   Movement: Present     General:  Alert, oriented and cooperative. Patient is in no acute distress.  Skin: Skin is warm and dry. No rash noted.   Cardiovascular: Normal heart rate noted  Respiratory: Normal respiratory effort, no problems with respiration noted  Abdomen: Soft, gravid, appropriate for gestational age. Pain/Pressure: Present     Pelvic:  Cervical exam deferred        Extremities: Normal range of motion.  Edema: None  Mental Status: Normal mood and affect. Normal behavior. Normal judgment and thought content.   NST: Baseline FHR: 130 beats/min Variability: moderate Accelerations:  present Decelerations: absent Tocometry: not done  Interpretation:  INDICATIONS: chronic hypertension RESULTS:  A NST procedure was performed with FHR monitoring and a normal baseline established, appropriate time of 20-40 minutes of evaluation, and accels >2 seen w 15x15 characteristics.  Results show a REACTIVE NST.    Assessment   30 y.o. G2P1001 at [redacted]w[redacted]d by  11/07/2020, by Last Menstrual Period presenting for routine prenatal visit  Plan   Pregnancy#2 Problems (from 02/01/20 to present)    Problem Noted Resolved   Maternal obesity, antepartum 03/28/2020 by [name], MD No   Supervision of high risk pregnancy, antepartum 03/10/2018 by [name], MD No   Overview Addendum 09/26/2020  3:13 PM by [name], MD    [facility] Prenatal Labs  Dating EDD by LMP c/w 9w u/s Blood type: O/Positive/-- (09/18 1153)   Genetic Screen NIPS: Normal XY Inheritest: SMA, CF, Fragile-X negative 01/03/2018 Antibody:Negative (09/18 1153)  Anatomic [place] Complete 06/25/20 Rubella: 5.81 (09/18 1153)  Varicella: Immune  GTT Early: Hgb1C 5.3, 1-hr 131 Third trimester: 145 3-hr 86, 155, 146, 110 RPR: Non Reactive (09/18 1153)   Rhogam N/A HBsAg: Negative (09/18 1153)   TDaP vaccine Flu Shot Covid  09/26/20  05/04/21 Pfeizer (booster as well) HIV: Non Reactive (09/18 1153)   Baby Food  Breast                               GBS:   Contraception  POP Pap: 03/10/18 NILM  CBB  no Pelvis tested to 8lbs 4oz (VAVD)  CS/VBAC N/A  [ placed 09/26/20] anesthesia referral  Support Person Husband [name]           Previous Version   Chronic hypertension during pregnancy, antepartum 03/10/2018 by Georgianne Fick,  Conan Bowens, MD No   Overview Addendum 03/28/2020 10:03 AM by [name], MD    [ ]  Aspirin 81 mg daily after 12 weeks; discontinue after 36 weeks [X]  baseline labs with CBC, CMP, urine protein/creatinine ratio [ ]  no BP meds unless BPs become elevated [ ]  ultrasound for growth at 28, 32, 36  weeks    Current antihypertensives:  None   Baseline and surveillance labs (pulled in from [facility], refresh links as needed)  Lab Results  Component Value Date   PLT 379 03/10/2018   CREATININE 0.70 03/10/2018   AST 18 03/10/2018   ALT 10 03/10/2018    Antenatal Testing CHTN - O10.919  Group I  BP < 140/90, no preeclampsia, AGA,  nml AFV, +/- meds    Group II BP > 140/90, on meds, no preeclampsia, AGA, nml AFV  20-28-34-38  20-24-28-32-35-38  32//2 x wk  28//BPP wkly then 32//2 x wk  40 no meds; 39 meds  PRN or 37  Pre-eclampsia  GHTN - O13.9/Preeclampsia without severe features  - O14.00   Preeclampsia with severe features - O14.10  Q 3-4wks  Q 2 wks  28//BPP wkly then 32//2 x wk  Inpatient  37  PRN or 34        Previous Version   Obesity affecting pregnancy 03/10/2018 by [name], MD No   Overview Signed 09/26/2020  3:00 PM by [name], MD    Antenatal Testing [ ]  BMI 35-39.9 Weekly at 37 weeks [x ] BMI 40 or >Weekly at 34 weeks           Term labor symptoms and general obstetric precautions including but not limited to vaginal bleeding, contractions, leaking of fluid and fetal movement were reviewed in detail with the patient. Please refer to After Visit Summary for other counseling recommendations.   Return in 4 days (on 10/29/2020) for Previously scheduled ROB/NST.   Prentice Docker, MD, [facility], [facility] 10/25/2020 5:12 PM

## 2020-10-29 ENCOUNTER — Other Ambulatory Visit: Payer: Self-pay

## 2020-10-29 ENCOUNTER — Ambulatory Visit (INDEPENDENT_AMBULATORY_CARE_PROVIDER_SITE_OTHER): Payer: BC Managed Care – PPO | Admitting: Obstetrics and Gynecology

## 2020-10-29 VITALS — BP 120/70 | Wt 334.0 lb

## 2020-10-29 DIAGNOSIS — O10919 Unspecified pre-existing hypertension complicating pregnancy, unspecified trimester: Secondary | ICD-10-CM | POA: Diagnosis not present

## 2020-10-29 DIAGNOSIS — O0990 Supervision of high risk pregnancy, unspecified, unspecified trimester: Secondary | ICD-10-CM

## 2020-10-29 DIAGNOSIS — Z3A38 38 weeks gestation of pregnancy: Secondary | ICD-10-CM

## 2020-10-29 DIAGNOSIS — O99210 Obesity complicating pregnancy, unspecified trimester: Secondary | ICD-10-CM | POA: Diagnosis not present

## 2020-10-29 LAB — POCT URINALYSIS DIPSTICK OB
Glucose, UA: NEGATIVE
POC,PROTEIN,UA: NEGATIVE

## 2020-10-29 NOTE — Progress Notes (Signed)
Obstetric H&P   Chief Complaint: ROB and induction scheduling  Prenatal Care Provider: [facility]  History of Present Illness: 30 y.o. G2P1001 52w5dby 11/07/2020, by Last Menstrual Period presenting today for routine antepartum testing.  +FM, no LOF, no VB, no contractions.  [facility].   The patient had an elevated 1-hr normal 3-hr OGTT.  Her Body mass index is 49.32 kg/m.   Most recent growth scan5/07/2020 4087g or 9lbs c/w 99%ile with AFI of 17.29cm  Pregravid weight 270 lb (122.5 kg) Total Weight Gain 64 lb (29 kg)  Pregnancy#2 Problems (from 02/01/20 to present)    Problem Noted Resolved   Maternal obesity, antepartum 03/28/2020 by SMalachy Mood MD No   Supervision of high risk pregnancy, antepartum 03/10/2018 by SMalachy Mood MD No   Overview Addendum 09/26/2020  3:13 PM by HGae Dry MSenatobiaPrenatal Labs  Dating EDD by LMP c/w 9w u/s Blood type: O/Positive/-- (09/18 1153)   Genetic Screen NIPS: Normal XY Inheritest: SMA, CF, Fragile-X negative 01/03/2018 Antibody:Negative (09/18 1153)  Anatomic [place] 06/25/20 Rubella: 5.81 (09/18 1153)  Varicella: Immune  GTT Early: Hgb1C 5.3, 1-hr 131 Third trimester: 145 3-hr 86, 155, 146, 110 RPR: Non Reactive (09/18 1153)   Rhogam N/A HBsAg: Negative (09/18 1153)   TDaP vaccine Flu Shot Covid  09/26/20  05/04/21 Pfeizer (booster as well) HIV: Non Reactive (09/18 1153)   Baby Food  Breast                               GBS:   Contraception  POP Pap: 03/10/18 NILM  CBB  no Pelvis tested to 8lbs 4oz (VAVD)  CS/VBAC N/A  [ placed 09/26/20] anesthesia referral  Support Person Husband STheresia Lo          Previous Version   Chronic hypertension during pregnancy, antepartum 03/10/2018 by SMalachy Mood MD No   Overview Addendum 03/28/2020 10:03 AM by SMalachy Mood MD    [ ]  Aspirin 81 mg daily after 12 weeks; discontinue after 36 weeks [X]  baseline labs  with CBC, CMP, urine protein/creatinine ratio [ ]  no BP meds unless BPs become elevated [ ]  ultrasound for growth at 28, 32, 36 weeks    Current antihypertensives:  None   [facility] and [facility] [place] [facility]  Lab Results  Component Value Date   PLT 379 03/10/2018   CREATININE 0.70 03/10/2018   AST 18 03/10/2018   ALT 10 03/10/2018    Antenatal Testing CHTN - O10.919  Group I  BP < 140/90, no preeclampsia, AGA,  nml AFV, +/- meds    Group II BP > 140/90, on meds, no preeclampsia, AGA, nml AFV  20-28-34-38  20-24-28-32-35-38  32//2 x wk  28//BPP wkly then 32//2 x wk  40 no meds; 39 meds  PRN or 37  Pre-eclampsia  GHTN - O13.9/Preeclampsia without severe features  - O14.00   Preeclampsia with severe features - O14.10  Q 3-4wks  Q 2 wks  28//BPP wkly then 32//2 x wk  Inpatient  37  PRN or 34        Previous Version   Obesity affecting pregnancy 03/10/2018 by SMalachy Mood MD No   Overview Signed 09/26/2020  3:00 PM by HGae Dry MD    Antenatal Testing [ ]  BMI 35-39.9 Weekly at 37 weeks [  x ] BMI 40 or >Weekly at 34 weeks           Review of Systems: 10 point review of systems negative unless otherwise noted in HPI  Past Medical History: Patient Active Problem List   Diagnosis Date Noted  . Maternal obesity, antepartum 03/28/2020  . Family history of colon cancer   . Supervision of high risk pregnancy, antepartum 03/10/2018    Clinic [facility] Prenatal Labs  Dating EDD by LMP c/w 9w u/s Blood type: O/Positive/-- (09/18 1153)   Genetic Screen NIPS: Normal XY Inheritest: SMA, CF, Fragile-X negative 01/03/2018 Antibody:Negative (09/18 1153)  Anatomic [place] Complete 06/25/20 Rubella: 5.81 (09/18 1153)  Varicella: Immune  GTT Early: Hgb1C 5.3, 1-hr 131 Third trimester: 145 3-hr 86, 155, 146, 110 RPR: Non Reactive (09/18 1153)   Rhogam N/A HBsAg: Negative (09/18 1153)   TDaP vaccine Flu Shot Covid  09/26/20   05/04/21 Pfeizer (booster as well) HIV: Non Reactive (09/18 1153)   Baby Food  Breast                               GBS:   Contraception  POP Pap: 03/10/18 NILM  CBB  no Pelvis tested to 8lbs 4oz (VAVD)  CS/VBAC N/A  [ placed 09/26/20] anesthesia referral  Support Person Husband [name]        . Chronic hypertension during pregnancy, antepartum 03/10/2018    [ ]  Aspirin 81 mg daily after 12 weeks; discontinue after 36 weeks [X]  baseline labs with CBC, CMP, urine protein/creatinine ratio [ ]  no BP meds unless BPs become elevated [ ]  ultrasound for growth at 28, 32, 36 weeks    Current antihypertensives:  None   Baseline and surveillance labs (pulled in from [facility], refresh links as needed)  Lab Results  Component Value Date   PLT 379 03/10/2018   CREATININE 0.70 03/10/2018   AST 18 03/10/2018   ALT 10 03/10/2018    Antenatal Testing CHTN - O10.919  Group I  BP < 140/90, no preeclampsia, AGA,  nml AFV, +/- meds    Group II BP > 140/90, on meds, no preeclampsia, AGA, nml AFV  20-28-34-38  20-24-28-32-35-38  32//2 x wk  28//BPP wkly then 32//2 x wk  40 no meds; 39 meds  PRN or 37  Pre-eclampsia  GHTN - O13.9/Preeclampsia without severe features  - O14.00   Preeclampsia with severe features - O14.10  Q 3-4wks  Q 2 wks  28//BPP wkly then 32//2 x wk  Inpatient  37  PRN or 34     . Obesity affecting pregnancy 03/10/2018    Antenatal Testing [ ]  BMI 35-39.9 Weekly at 37 weeks [x ] BMI 40 or >Weekly at 34 weeks      Past Surgical History: Past Surgical History:  Procedure Laterality Date  . COLONOSCOPY WITH PROPOFOL N/A 05/27/2019   Procedure: COLONOSCOPY WITH PROPOFOL;  Surgeon: Lucilla Lame, MD;  Location: [facility] ENDOSCOPY;  Service: Endoscopy;  Laterality: N/A;  . DIAGNOSTIC LAPAROSCOPY    . LAPAROSCOPIC OVARIAN CYSTECTOMY Right 02/24/2019   Procedure: LAPAROSCOPIC RIGHT OVARIAN CYSTECTOMY;  Surgeon: [name], MD;  Location: [facility] ORS;  Service:  Gynecology;  Laterality: Right;  . TONSILLECTOMY      Past Obstetric History: # 1 - Date: 11/01/18, Sex: Female, Weight: 8 lb 4.3 oz (3.75 kg), GA: [redacted]w[redacted]d Delivery: Vaginal, Vacuum (Extractor), Apgar1: 8, Apgar5: 9, Living: Living, Birth Comments: None  # 2 -  Date: None, Sex: None, Weight: None, GA: None, Delivery: None, Apgar1: None, Apgar5: None, Living: None, Birth Comments: None   Past Gynecologic History:  Family History: Family History  Problem Relation Age of Onset  . Breast cancer Other 50  . Breast cancer Maternal Uncle 28    Social History: Social History   Socioeconomic History  . Marital status: Married    Spouse name: Not on file  . Number of children: Not on file  . Years of education: Not on file  . Highest education level: Not on file  Occupational History  . Not on file  Tobacco Use  . Smoking status: Never Smoker  . Smokeless tobacco: Never Used  Vaping Use  . Vaping Use: Never used  Substance and Sexual Activity  . Alcohol use: Never  . Drug use: Never  . Sexual activity: Yes    Partners: Male    Birth control/protection: None  Other Topics Concern  . Not on file  Social History Narrative  . Not on file   Social Determinants of Health   Financial Resource Strain: Not on file  Food Insecurity: Not on file  Transportation Needs: Not on file  Physical Activity: Not on file  Stress: Not on file  Social Connections: Not on file  Intimate Partner Violence: Not on file    Medications: Prior to Admission medications   Medication Sig Start Date End Date Taking? Authorizing Provider  aspirin EC 81 MG tablet Take 81 mg by mouth daily. Swallow whole.    [provider]  escitalopram (LEXAPRO) 20 MG tablet Take 1 tablet (20 mg total) by mouth daily. 08/13/20   [name], MD  loratadine (CLARITIN) 10 MG tablet Take 10 mg by mouth daily.    [provider]  omeprazole (PRILOSEC OTC) 20 MG tablet Take 20 mg by mouth daily as  needed (acid reflux).    [provider]  Prenatal Vit-Fe Fumarate-FA (PRENATAL MULTIVITAMIN) TABS tablet Take 1 tablet by mouth daily at 12 noon.    [provider]  VITAMIN D PO Take by mouth.    [provider]    Allergies: Allergies  Allergen Reactions  . Penicillins Itching    Did it involve swelling of the face/tongue/throat, SOB, or low BP? No Did it involve sudden or severe rash/hives, skin peeling, or any reaction on the inside of your mouth or nose? No Did you need to seek medical attention at a hospital or doctor's office? No When did it last happen?may 2020 If all above answers are "NO", may proceed with cephalosporin use.   . Sulfa Antibiotics     Unknown, childhood allergy    Physical Exam: Vitals: Blood pressure 120/70, weight (!) 334 lb (151.5 kg), last menstrual period 02/01/2020, not currently breastfeeding.  Baseline: 150 Variability: moderate Accelerations: present Decelerations: absent Tocometry: none The patient was monitored for 30 minutes, fetal heart rate tracing was deemed reactive, category I tracing,  General: NAD HEENT: normocephalic, anicteric Pulmonary: No increased work of breathing Cardiovascular: RRR, distal pulses 2+ Abdomen: Gravid, non-tender Leopolds: vtx Genitourinary: deferred Extremities: no edema, erythema, or tenderness Neurologic: Grossly intact Psychiatric: mood appropriate, affect full  Labs: Results for orders placed or performed in visit on 10/29/20 (from the past 24 hour(s))  POC Urinalysis Dipstick OB     Status: Normal   Collection Time: 10/29/20 11:05 AM  Result Value Ref Range   Color, UA     Clarity, UA     Glucose, UA  Negative Negative   Bilirubin, UA     Ketones, UA     Spec Grav, UA     Blood, UA     pH, UA     POC,PROTEIN,UA Negative Negative, Trace, Small (1+), Moderate (2+), Large (3+), 4+   Urobilinogen, UA     Nitrite, UA     Leukocytes, UA     Appearance     Odor       Assessment: 30 y.o. G2P1001 31w5dby 11/07/2020, by Last Menstrual Period presenting for ROB with IOL 11/02/2020  Plan: 1) XCHTN well controlled no medications.  IOL for 11/02/2020   2) Fetus -cat I tracing  3) PNL - Blood type O/Positive/-- (10/06 1123) / Anti-bodyscreen Negative (10/06 1123) / Rubella 1.50 (10/06 1123) / Varicella Immune / RPR Non Reactive (03/02 0956) / HBsAg Negative (10/06 1123) / HIV Non Reactive (03/02 0956) / 1-hr OGTT 145 with normal follow up 3-hr / GBS Negative/-- (04/22 1600)  4) Immunization History -  Immunization History  Administered Date(s) Administered  . DTaP 11/13/1994  . Influenza Split 04/19/2014  . Influenza,inj,Quad PF,6+ Mos 03/04/2019, 05/04/2020  . MMR 11/13/1994, 10/26/2017  . OPV 11/13/1994  . PFIZER(Purple Top)SARS-COV-2 Vaccination 08/20/2019, 09/14/2019, 03/26/2020  . PPD Test 09/30/2011, 10/23/2017  . Pneumococcal Conjugate-13 04/18/1999  . Tdap 10/29/2011, 06/13/2016, 08/31/2018, 09/26/2020    5) Disposition - pending delivery  AMalachy Mood MD, [place]5/02/2021, 11:27 AM

## 2020-10-31 ENCOUNTER — Other Ambulatory Visit: Payer: Self-pay

## 2020-10-31 ENCOUNTER — Other Ambulatory Visit
Admission: RE | Admit: 2020-10-31 | Discharge: 2020-10-31 | Disposition: A | Discharge status: Home / Self Care | Payer: BC Managed Care – PPO | Source: Ambulatory Visit | Attending: Obstetrics and Gynecology | Admitting: Obstetrics and Gynecology

## 2020-10-31 DIAGNOSIS — Z20822 Contact with and (suspected) exposure to covid-19: Secondary | ICD-10-CM | POA: Insufficient documentation

## 2020-10-31 DIAGNOSIS — Z01812 Encounter for preprocedural laboratory examination: Secondary | ICD-10-CM | POA: Insufficient documentation

## 2020-10-31 LAB — SARS CORONAVIRUS 2 (TAT 6-24 HRS): SARS Coronavirus 2: NEGATIVE

## 2020-11-01 NOTE — Telephone Encounter (Signed)
error 

## 2020-11-02 ENCOUNTER — Encounter: Payer: Self-pay | Admitting: Obstetrics and Gynecology

## 2020-11-02 ENCOUNTER — Other Ambulatory Visit: Payer: Self-pay

## 2020-11-02 ENCOUNTER — Inpatient Hospital Stay: Payer: BC Managed Care – PPO | Admitting: Anesthesiology

## 2020-11-02 ENCOUNTER — Inpatient Hospital Stay
Admission: EM | Admit: 2020-11-02 | Discharge: 2020-11-04 | DRG: 806 | Disposition: A | Discharge status: Home / Self Care | Payer: BC Managed Care – PPO | Attending: Obstetrics and Gynecology | Admitting: Obstetrics and Gynecology

## 2020-11-02 DIAGNOSIS — O1092 Unspecified pre-existing hypertension complicating childbirth: Secondary | ICD-10-CM | POA: Diagnosis not present

## 2020-11-02 DIAGNOSIS — Z3A39 39 weeks gestation of pregnancy: Secondary | ICD-10-CM | POA: Diagnosis not present

## 2020-11-02 DIAGNOSIS — O99214 Obesity complicating childbirth: Secondary | ICD-10-CM | POA: Diagnosis present

## 2020-11-02 DIAGNOSIS — D62 Acute posthemorrhagic anemia: Secondary | ICD-10-CM | POA: Diagnosis not present

## 2020-11-02 DIAGNOSIS — O10919 Unspecified pre-existing hypertension complicating pregnancy, unspecified trimester: Secondary | ICD-10-CM

## 2020-11-02 DIAGNOSIS — O3663X0 Maternal care for excessive fetal growth, third trimester, not applicable or unspecified: Secondary | ICD-10-CM | POA: Diagnosis present

## 2020-11-02 DIAGNOSIS — O99210 Obesity complicating pregnancy, unspecified trimester: Secondary | ICD-10-CM

## 2020-11-02 DIAGNOSIS — O1002 Pre-existing essential hypertension complicating childbirth: Secondary | ICD-10-CM | POA: Diagnosis present

## 2020-11-02 DIAGNOSIS — O9081 Anemia of the puerperium: Secondary | ICD-10-CM | POA: Diagnosis not present

## 2020-11-02 DIAGNOSIS — Z20822 Contact with and (suspected) exposure to covid-19: Secondary | ICD-10-CM | POA: Diagnosis present

## 2020-11-02 DIAGNOSIS — Z3490 Encounter for supervision of normal pregnancy, unspecified, unspecified trimester: Secondary | ICD-10-CM | POA: Diagnosis present

## 2020-11-02 DIAGNOSIS — O0990 Supervision of high risk pregnancy, unspecified, unspecified trimester: Secondary | ICD-10-CM

## 2020-11-02 DIAGNOSIS — O99213 Obesity complicating pregnancy, third trimester: Secondary | ICD-10-CM

## 2020-11-02 LAB — RPR: RPR Ser Ql: NONREACTIVE

## 2020-11-02 LAB — CBC
HCT: 33.7 % — ABNORMAL LOW (ref 36.0–46.0)
Hemoglobin: 11.5 g/dL — ABNORMAL LOW (ref 12.0–15.0)
MCH: 27.8 pg (ref 26.0–34.0)
MCHC: 34.1 g/dL (ref 30.0–36.0)
MCV: 81.6 fL (ref 80.0–100.0)
Platelets: 262 10*3/uL (ref 150–400)
RBC: 4.13 MIL/uL (ref 3.87–5.11)
RDW: 13.3 % (ref 11.5–15.5)
WBC: 11.7 10*3/uL — ABNORMAL HIGH (ref 4.0–10.5)
nRBC: 0 % (ref 0.0–0.2)

## 2020-11-02 LAB — COMPREHENSIVE METABOLIC PANEL
ALT: 16 U/L (ref 0–44)
AST: 29 U/L (ref 15–41)
Albumin: 2.8 g/dL — ABNORMAL LOW (ref 3.5–5.0)
Alkaline Phosphatase: 160 U/L — ABNORMAL HIGH (ref 38–126)
Anion gap: 11 (ref 5–15)
BUN: 7 mg/dL (ref 6–20)
CO2: 17 mmol/L — ABNORMAL LOW (ref 22–32)
Calcium: 8.7 mg/dL — ABNORMAL LOW (ref 8.9–10.3)
Chloride: 105 mmol/L (ref 98–111)
Creatinine, Ser: 0.51 mg/dL (ref 0.44–1.00)
GFR, Estimated: >60 mL/min (ref >60)
Glucose, Bld: 138 mg/dL — ABNORMAL HIGH (ref 70–99)
Potassium: 3.4 mmol/L — ABNORMAL LOW (ref 3.5–5.1)
Sodium: 133 mmol/L — ABNORMAL LOW (ref 135–145)
Total Bilirubin: 0.4 mg/dL (ref 0.3–1.2)
Total Protein: 6.4 g/dL — ABNORMAL LOW (ref 6.5–8.1)

## 2020-11-02 LAB — TYPE AND SCREEN
ABO/RH(D): O POS
Antibody Screen: NEGATIVE

## 2020-11-02 LAB — PROTEIN / CREATININE RATIO, URINE
Creatinine, Urine: 157 mg/dL
Protein Creatinine Ratio: 0.09 mg/mg{Cre} (ref 0.00–0.15)
Total Protein, Urine: 14 mg/dL

## 2020-11-02 MED ORDER — FENTANYL 2.5 MCG/ML W/ROPIVACAINE 0.15% IN NS 100 ML EPIDURAL (ARMC)
12.0000 mL/h | EPIDURAL | Status: DC
  Administered 2020-11-02: 12 mL/h via EPIDURAL
  Filled 2020-11-02: qty 100

## 2020-11-02 MED ORDER — AMMONIA AROMATIC IN INHA
RESPIRATORY_TRACT | Status: AC
  Filled 2020-11-02: qty 10

## 2020-11-02 MED ORDER — EPHEDRINE 5 MG/ML INJ
10.0000 mg | INTRAVENOUS | Status: DC | PRN
  Filled 2020-11-02: qty 2

## 2020-11-02 MED ORDER — LACTATED RINGERS IV SOLN: 500.0000 mL | Freq: Once | INTRAVENOUS | Status: DC

## 2020-11-02 MED ORDER — DIPHENHYDRAMINE HCL 50 MG/ML IJ SOLN: 12.5000 mg | INTRAMUSCULAR | Status: DC | PRN

## 2020-11-02 MED ORDER — MISOPROSTOL 200 MCG PO TABS
ORAL_TABLET | ORAL | Status: AC
  Administered 2020-11-02: 25 ug via VAGINAL
  Filled 2020-11-02: qty 4

## 2020-11-02 MED ORDER — LIDOCAINE HCL (PF) 1 % IJ SOLN
INTRAMUSCULAR | Status: DC | PRN
  Administered 2020-11-02: 4 mL via SUBCUTANEOUS

## 2020-11-02 MED ORDER — PHENYLEPHRINE 40 MCG/ML (10ML) SYRINGE FOR IV PUSH (FOR BLOOD PRESSURE SUPPORT)
80.0000 ug | PREFILLED_SYRINGE | INTRAVENOUS | Status: DC | PRN
  Filled 2020-11-02: qty 10

## 2020-11-02 MED ORDER — LIDOCAINE HCL (PF) 1 % IJ SOLN
30.0000 mL | INTRAMUSCULAR | Status: DC | PRN
  Filled 2020-11-02: qty 30

## 2020-11-02 MED ORDER — OXYTOCIN 10 UNIT/ML IJ SOLN
10.0000 [IU] | Freq: Once | INTRAMUSCULAR | Status: DC
  Filled 2020-11-02: qty 1

## 2020-11-02 MED ORDER — SODIUM CHLORIDE 0.9 % IV SOLN
INTRAVENOUS | Status: DC | PRN
  Administered 2020-11-02 (×2): 5 mL via EPIDURAL

## 2020-11-02 MED ORDER — ONDANSETRON HCL 4 MG/2ML IJ SOLN
4.0000 mg | Freq: Four times a day (QID) | INTRAMUSCULAR | Status: DC | PRN
  Administered 2020-11-02: 4 mg via INTRAVENOUS
  Filled 2020-11-02: qty 2

## 2020-11-02 MED ORDER — LACTATED RINGERS IV SOLN: 500.0000 mL | INTRAVENOUS | Status: DC | PRN

## 2020-11-02 MED ORDER — LACTATED RINGERS IV SOLN: INTRAVENOUS | Status: DC

## 2020-11-02 MED ORDER — MISOPROSTOL 25 MCG QUARTER TABLET
25.0000 ug | ORAL_TABLET | ORAL | Status: DC | PRN
  Administered 2020-11-02 (×2): 25 ug via VAGINAL
  Filled 2020-11-02 (×4): qty 1

## 2020-11-02 MED ORDER — OXYTOCIN-SODIUM CHLORIDE 30-0.9 UT/500ML-% IV SOLN: 1.0000 m[IU]/min | INTRAVENOUS | Status: DC

## 2020-11-02 MED ORDER — OXYTOCIN-SODIUM CHLORIDE 30-0.9 UT/500ML-% IV SOLN
2.5000 [IU]/h | INTRAVENOUS | Status: DC
  Filled 2020-11-02: qty 500

## 2020-11-02 MED ORDER — LIDOCAINE-EPINEPHRINE (PF) 1.5 %-1:200000 IJ SOLN
INTRAMUSCULAR | Status: DC | PRN
  Administered 2020-11-02: 3 mL via EPIDURAL

## 2020-11-02 MED ORDER — OXYTOCIN BOLUS FROM INFUSION
333.0000 mL | Freq: Once | INTRAVENOUS | Status: AC
  Administered 2020-11-03: 333 mL via INTRAVENOUS

## 2020-11-02 MED ORDER — TERBUTALINE SULFATE 1 MG/ML IJ SOLN: 0.2500 mg | Freq: Once | INTRAMUSCULAR | Status: DC | PRN

## 2020-11-02 MED ORDER — FENTANYL 2.5 MCG/ML W/ROPIVACAINE 0.15% IN NS 100 ML EPIDURAL (ARMC)
EPIDURAL | Status: AC
  Filled 2020-11-02: qty 100

## 2020-11-02 MED ORDER — SOD CITRATE-CITRIC ACID 500-334 MG/5ML PO SOLN: 30.0000 mL | ORAL | Status: DC | PRN

## 2020-11-02 NOTE — Progress Notes (Signed)
   Subjective:  Comfortable epidural in palce  Objective:   Vitals: Blood pressure 134/67, pulse 83, temperature 97.8 F (36.6 C), temperature source Oral, resp. rate 18, last menstrual period 02/01/2020. General: NAD Abdomen: Gravid, non-tender Cervical Exam:  Dilation: 4 Effacement (%): 70 Station: -1 Presentation: Vertex Exam by:: Georgianne Fick, MD  FHT: 145, moderate, +accels, no decels Toco: q2-60min  Results for orders placed or performed during the hospital encounter of 11/02/20 (from the past 24 hour(s))  CBC     Status: Abnormal   Collection Time: 11/02/20  5:03 AM  Result Value Ref Range   WBC 11.7 (H) 4.0 - 10.5 K/uL   RBC 4.13 3.87 - 5.11 MIL/uL   Hemoglobin 11.5 (L) 12.0 - 15.0 g/dL   HCT 33.7 (L) 36.0 - 46.0 %   MCV 81.6 80.0 - 100.0 fL   MCH 27.8 26.0 - 34.0 pg   MCHC 34.1 30.0 - 36.0 g/dL   RDW 13.3 11.5 - 15.5 %   Platelets 262 150 - 400 K/uL   nRBC 0.0 0.0 - 0.2 %  Type and screen     Status: None   Collection Time: 11/02/20  5:03 AM  Result Value Ref Range   ABO/RH(D) O POS    Antibody Screen NEG    Sample Expiration      11/05/2020,2359 Performed at [facility], [address] [place], [place] [address]   RPR     Status: None   Collection Time: 11/02/20  5:03 AM  Result Value Ref Range   RPR Ser Ql NON REACTIVE NON REACTIVE  Comprehensive metabolic panel     Status: Abnormal   Collection Time: 11/02/20  5:03 AM  Result Value Ref Range   Sodium 133 (L) 135 - 145 mmol/L   Potassium 3.4 (L) 3.5 - 5.1 mmol/L   Chloride 105 98 - 111 mmol/L   CO2 17 (L) 22 - 32 mmol/L   Glucose, Bld 138 (H) 70 - 99 mg/dL   BUN 7 6 - 20 mg/dL   Creatinine, Ser 0.51 0.44 - 1.00 mg/dL   Calcium 8.7 (L) 8.9 - 10.3 mg/dL   Total Protein 6.4 (L) 6.5 - 8.1 g/dL   Albumin 2.8 (L) 3.5 - 5.0 g/dL   AST 29 15 - 41 U/L   ALT 16 0 - 44 U/L   Alkaline Phosphatase 160 (H) 38 - 126 U/L   Total Bilirubin 0.4 0.3 - 1.2 mg/dL   GFR, Estimated >60 >60 mL/min   Anion  gap 11 5 - 15  Protein / creatinine ratio, urine     Status: None   Collection Time: 11/02/20  5:03 AM  Result Value Ref Range   Creatinine, Urine 157 mg/dL   Total Protein, Urine 14 mg/dL   Protein Creatinine Ratio 0.09 0.00 - 0.15 mg/mg[Cre]    Assessment:   30 y.o. G2P1001 [redacted]w[redacted]d elective IOL  Plan:   1) Labor - AROM light meconium, monitor contraction patter pitocin ordered should contractions space out  2) Fetus - cat I tacing  [name], MD, [facility], [facility] 11/02/2020, 8:31 PM

## 2020-11-02 NOTE — Progress Notes (Signed)
   Subjective:  Feeling contraction 5/10 pain.    Objective:   Vitals: Blood pressure 132/86, pulse 71, temperature 98.3 F (36.8 C), temperature source Oral, resp. rate 18, last menstrual period 02/01/2020. General: NAD Abdomen: gravid, non-tender Cervical Exam:  Dilation: 3 Effacement (%): 50 Station: -2 Exam by:: Georgianne Fick, MD  FHT: 125, moderate, +accels, no decels Toco: q2-16min  Results for orders placed or performed during the hospital encounter of 11/02/20 (from the past 24 hour(s))  CBC     Status: Abnormal   Collection Time: 11/02/20  5:03 AM  Result Value Ref Range   WBC 11.7 (H) 4.0 - 10.5 K/uL   RBC 4.13 3.87 - 5.11 MIL/uL   Hemoglobin 11.5 (L) 12.0 - 15.0 g/dL   HCT 33.7 (L) 36.0 - 46.0 %   MCV 81.6 80.0 - 100.0 fL   MCH 27.8 26.0 - 34.0 pg   MCHC 34.1 30.0 - 36.0 g/dL   RDW 13.3 11.5 - 15.5 %   Platelets 262 150 - 400 K/uL   nRBC 0.0 0.0 - 0.2 %  Type and screen     Status: None   Collection Time: 11/02/20  5:03 AM  Result Value Ref Range   ABO/RH(D) O POS    Antibody Screen NEG    Sample Expiration      11/05/2020,2359 Performed at [facility], [address] [place], [place] [address]   RPR     Status: None   Collection Time: 11/02/20  5:03 AM  Result Value Ref Range   RPR Ser Ql NON REACTIVE NON REACTIVE  Comprehensive metabolic panel     Status: Abnormal   Collection Time: 11/02/20  5:03 AM  Result Value Ref Range   Sodium 133 (L) 135 - 145 mmol/L   Potassium 3.4 (L) 3.5 - 5.1 mmol/L   Chloride 105 98 - 111 mmol/L   CO2 17 (L) 22 - 32 mmol/L   Glucose, Bld 138 (H) 70 - 99 mg/dL   BUN 7 6 - 20 mg/dL   Creatinine, Ser 0.51 0.44 - 1.00 mg/dL   Calcium 8.7 (L) 8.9 - 10.3 mg/dL   Total Protein 6.4 (L) 6.5 - 8.1 g/dL   Albumin 2.8 (L) 3.5 - 5.0 g/dL   AST 29 15 - 41 U/L   ALT 16 0 - 44 U/L   Alkaline Phosphatase 160 (H) 38 - 126 U/L   Total Bilirubin 0.4 0.3 - 1.2 mg/dL   GFR, Estimated >60 >60 mL/min   Anion gap 11 5 - 15   Protein / creatinine ratio, urine     Status: None   Collection Time: 11/02/20  5:03 AM  Result Value Ref Range   Creatinine, Urine 157 mg/dL   Total Protein, Urine 14 mg/dL   Protein Creatinine Ratio 0.09 0.00 - 0.15 mg/mg[Cre]    Assessment:   30 y.o. G2P1001 [redacted]w[redacted]d IOL elective  Plan:   1) Labor - good cervical change noted, will plan on AROM following epidural placement  2) Fetus - cat I tracing  [name], MD, Normal, [facility] 11/02/2020, 6:33 PM

## 2020-11-02 NOTE — Progress Notes (Signed)
  Labor Progress Note   30 y.o. G2P1001 @ [redacted]w[redacted]d , admitted for  Pregnancy, Labor Management.   Subjective:  Beginning to feel contractions  Objective:  BP 132/86 (BP Location: Left Arm)   Pulse 71   Temp 98.3 F (36.8 C) (Oral)   Resp 18   LMP 02/01/2020  Abd: gravid, ND, FHT present, mild tenderness on exam Extr: trace to 1+ bilateral pedal edema SVE: CERVIX: some difficulty evaluating cervix- feels like a dimple in external os, still thick, cervical cytotec placed 2:30 PM  EFM: FHR: 125 bpm, variability: moderate,  accelerations:  Present,  decelerations:  Absent Toco: Frequency: every 2-6 Labs: I have reviewed the patient's lab results.   Assessment & Plan:  G2P1001 @ 105w2d, admitted for  Pregnancy and Labor/Delivery Management  1. Pain management: position changes. 2. FWB: FHT category I.  3. ID: GBS negative 4. Labor management: s/p 3rd dose cytotec, next exam due at 6:30 PM  All discussed with patient, see orders   Rod Can, [facility] 11/02/2020  3:19 PM

## 2020-11-02 NOTE — Anesthesia Preprocedure Evaluation (Signed)
Anesthesia Evaluation  Patient identified by MRN, date of birth, ID band Patient awake    Reviewed: Allergy & Precautions, H&P , NPO status , Patient's Chart, lab work & pertinent test results, reviewed documented beta blocker date and time   History of Anesthesia Complications Negative for: history of anesthetic complications  Airway Mallampati: IV  TM Distance: >3 FB Neck ROM: full    Dental  (+) Caps, Dental Advidsory Given, Missing, Teeth Intact   Pulmonary neg pulmonary ROS,    Pulmonary exam normal breath sounds clear to auscultation       Cardiovascular Exercise Tolerance: Good negative cardio ROS Normal cardiovascular exam Rhythm:regular Rate:Normal     Neuro/Psych negative neurological ROS  negative psych ROS   GI/Hepatic Neg liver ROS, GERD  ,  Endo/Other  neg diabetesMorbid obesity  Renal/GU negative Renal ROS  negative genitourinary   Musculoskeletal   Abdominal   Peds  Hematology negative hematology ROS (+)   Anesthesia Other Findings Past Medical History: No date: Family history of breast cancer     Comment:  7/21 genetic testing letter sent No date: GERD (gastroesophageal reflux disease) No date: Hypertension     Comment:  no longer hypertensive   Reproductive/Obstetrics (+) Pregnancy                             Anesthesia Physical Anesthesia Plan  ASA: III  Anesthesia Plan: Epidural   Post-op Pain Management:    Induction:   PONV Risk Score and Plan:   Airway Management Planned:   Additional Equipment:   Intra-op Plan:   Post-operative Plan:   Informed Consent: I have reviewed the patients History and Physical, chart, labs and discussed the procedure including the risks, benefits and alternatives for the proposed anesthesia with the patient or authorized representative who has indicated his/her understanding and acceptance.     Dental Advisory  Given  Plan Discussed with: Anesthesiologist, CRNA and Surgeon  Anesthesia Plan Comments:         Anesthesia Quick Evaluation

## 2020-11-02 NOTE — H&P (Addendum)
Initial H&P written 10/29/20 by Dr Georgianne Fick  History and Physical Interval Note:  11/02/2020 10:48 AM  Claudia Byrd  has presented today for INDUCTION OF LABOR (indication CHTN well controlled,  with the diagnosis of  CHTN /fetal macrosomia (99%)/weight gain 64#. The various methods of treatment have been discussed with the patient and family. After consideration of risks, benefits and other options for treatment, the patient has consented to  Labor induction.  The patient's history has been reviewed, patient examined, no change in status, and is stable for induction as planned.  See H&P. I have reviewed the patient's chart and labs.  Questions were answered to the patient's satisfaction.    Review of Systems  Constitutional: Negative for chills and fever.  HENT: Negative for congestion, ear discharge, ear pain, hearing loss, sinus pain and sore throat.   Eyes: Negative for blurred vision and double vision.  Respiratory: Negative for cough, shortness of breath and wheezing.   Cardiovascular: Negative for chest pain, palpitations and leg swelling.  Gastrointestinal: Negative for abdominal pain, blood in stool, constipation, diarrhea, heartburn, melena, nausea and vomiting.  Genitourinary: Negative for dysuria, flank pain, frequency, hematuria and urgency.  Musculoskeletal: Negative for back pain, joint pain and myalgias.  Skin: Negative for itching and rash.  Neurological: Negative for dizziness, tingling, tremors, sensory change, speech change, focal weakness, seizures, loss of consciousness, weakness and headaches.  Endo/Heme/Allergies: Negative for environmental allergies. Does not bruise/bleed easily.  Psychiatric/Behavioral: Negative for depression, hallucinations, memory loss, substance abuse and suicidal ideas. The patient is not nervous/anxious and does not have insomnia.    Vital Signs: BP 123/71 (BP Location: Left Arm)   Pulse 89   Temp 98 F (36.7 C) (Oral)   Resp 18   LMP  02/01/2020  Constitutional: Well nourished, well developed female in no acute distress.  HEENT: normal Skin: Warm and dry.  Cardiovascular: Regular rate and rhythm.   Extremity:  no edema   Respiratory: Clear to auscultation bilateral. Normal respiratory effort Abdomen: FHT present Back: no CVAT Neuro: DTRs 2+, Cranial nerves grossly intact Psych: Alert and Oriented x3. No memory deficits. Normal mood and affect.  MS: normal gait, normal bilateral lower extremity ROM/strength/stability.  Pelvic exam: (female chaperone present) is limited by body habitus EGBUS: within normal limits Vagina: within normal limits and with normal mucosa  Cervix: only able to reach edge of cervix- still feels thick, unable to determine dilation, 2nd dose cytotec placed  Reassess 4 hours after cytotec placement Consider foley bulb at next check   Rod Can, [facility] 11/02/2020  10:48 AM  Attestation of Attending Supervision of Advanced Practitioner (PA/CNM/NP): Evaluation and management procedures were performed by the Advanced Practitioner under my supervision and collaboration.  I have reviewed the Advanced Practitioner's note and chart, and I agree with the management and plan.  Pelvis tested to 3750g, vacuum for maternal exhaustion and favorable station after 2-hrs of pushing, no shoulder dystocia.  Growth scan scan 10/22/2020 4087g or 9lbs c/w 99%ile with AFI of 17.29cm.  EFW of 4487g assuming normal interval growth.  Weight gain this pregnancy 64lbs (29kg). Early 1-hr 131, with 28 week 1-hr of 145.  3-hr 86, 155, 146, and 110.   Glucose on admission noted to be 138 at [place], MD, [place], [facility] 11/02/2020, 4:46 PM

## 2020-11-02 NOTE — Anesthesia Procedure Notes (Signed)
Epidural Patient location during procedure: OB Start time: 11/02/2020 7:25 PM End time: 11/02/2020 7:35 PM  Staffing Anesthesiologist: Martha Clan, MD Performed: anesthesiologist   Preanesthetic Checklist Completed: patient identified, IV checked, site marked, risks and benefits discussed, surgical consent, monitors and equipment checked, pre-op evaluation and timeout performed  Epidural Patient position: sitting Prep: ChloraPrep Patient monitoring: heart rate, continuous pulse ox and blood pressure Approach: midline Location: L3-L4 Injection technique: LOR saline  Needle:  Needle type: Tuohy  Needle gauge: 17 G Needle length: 9 cm and 9 Needle insertion depth: 8 cm Catheter type: closed end flexible Catheter size: 19 Gauge Catheter at skin depth: 13 cm Test dose: negative and 1.5% lidocaine with Epi 1:200 K  Assessment Sensory level: T10 Events: blood not aspirated, injection not painful, no injection resistance, no paresthesia and negative IV test  Additional Notes 1st attempt Pt. Evaluated and documentation done after procedure finished. Patient identified. Risks/Benefits/Options discussed with patient including but not limited to bleeding, infection, nerve damage, paralysis, failed block, incomplete pain control, headache, blood pressure changes, nausea, vomiting, reactions to medication both or allergic, itching and postpartum back pain. Confirmed with bedside nurse the patient's most recent platelet count. Confirmed with patient that they are not currently taking any anticoagulation, have any bleeding history or any family history of bleeding disorders. Patient expressed understanding and wished to proceed. All questions were answered. Sterile technique was used throughout the entire procedure. Please see nursing notes for vital signs. Test dose was given through epidural catheter and negative prior to continuing to dose epidural or start infusion. Warning signs of high  block given to the patient including shortness of breath, tingling/numbness in hands, complete motor block, or any concerning symptoms with instructions to call for help. Patient was given instructions on fall risk and not to get out of bed. All questions and concerns addressed with instructions to call with any issues or inadequate analgesia.   Patient tolerated the insertion well without immediate complications.Reason for block:procedure for pain

## 2020-11-03 ENCOUNTER — Encounter: Payer: Self-pay | Admitting: Obstetrics and Gynecology

## 2020-11-03 DIAGNOSIS — O1092 Unspecified pre-existing hypertension complicating childbirth: Secondary | ICD-10-CM | POA: Diagnosis not present

## 2020-11-03 DIAGNOSIS — O3663X0 Maternal care for excessive fetal growth, third trimester, not applicable or unspecified: Secondary | ICD-10-CM | POA: Diagnosis not present

## 2020-11-03 DIAGNOSIS — Z3A39 39 weeks gestation of pregnancy: Secondary | ICD-10-CM

## 2020-11-03 LAB — CBC
HCT: 27.7 % — ABNORMAL LOW (ref 36.0–46.0)
Hemoglobin: 9.2 g/dL — ABNORMAL LOW (ref 12.0–15.0)
MCH: 28 pg (ref 26.0–34.0)
MCHC: 33.2 g/dL (ref 30.0–36.0)
MCV: 84.2 fL (ref 80.0–100.0)
Platelets: 213 10*3/uL (ref 150–400)
RBC: 3.29 MIL/uL — ABNORMAL LOW (ref 3.87–5.11)
RDW: 13.4 % (ref 11.5–15.5)
WBC: 14.8 10*3/uL — ABNORMAL HIGH (ref 4.0–10.5)
nRBC: 0 % (ref 0.0–0.2)

## 2020-11-03 MED ORDER — IBUPROFEN 600 MG PO TABS
600.0000 mg | ORAL_TABLET | Freq: Four times a day (QID) | ORAL | Status: DC
  Administered 2020-11-03 – 2020-11-04 (×5): 600 mg via ORAL
  Filled 2020-11-03 (×6): qty 1

## 2020-11-03 MED ORDER — SIMETHICONE 80 MG PO CHEW: 80.0000 mg | CHEWABLE_TABLET | ORAL | Status: DC | PRN

## 2020-11-03 MED ORDER — SENNOSIDES-DOCUSATE SODIUM 8.6-50 MG PO TABS
2.0000 | ORAL_TABLET | ORAL | Status: DC
  Administered 2020-11-03 – 2020-11-04 (×2): 2 via ORAL
  Filled 2020-11-03 (×2): qty 2

## 2020-11-03 MED ORDER — ONDANSETRON HCL 4 MG PO TABS: 4.0000 mg | ORAL_TABLET | ORAL | Status: DC | PRN

## 2020-11-03 MED ORDER — DIPHENHYDRAMINE HCL 25 MG PO CAPS: 25.0000 mg | ORAL_CAPSULE | Freq: Four times a day (QID) | ORAL | Status: DC | PRN

## 2020-11-03 MED ORDER — OXYCODONE-ACETAMINOPHEN 5-325 MG PO TABS: 2.0000 | ORAL_TABLET | ORAL | Status: DC | PRN

## 2020-11-03 MED ORDER — PRENATAL MULTIVITAMIN CH
1.0000 | ORAL_TABLET | Freq: Every day | ORAL | Status: DC
  Administered 2020-11-03: 1 via ORAL
  Filled 2020-11-03: qty 1

## 2020-11-03 MED ORDER — DIBUCAINE (PERIANAL) 1 % EX OINT
1.0000 "application " | TOPICAL_OINTMENT | CUTANEOUS | Status: DC | PRN
  Filled 2020-11-03: qty 28

## 2020-11-03 MED ORDER — BENZOCAINE-MENTHOL 20-0.5 % EX AERO
1.0000 "application " | INHALATION_SPRAY | CUTANEOUS | Status: DC | PRN
  Filled 2020-11-03: qty 56

## 2020-11-03 MED ORDER — ESCITALOPRAM OXALATE 10 MG PO TABS
20.0000 mg | ORAL_TABLET | Freq: Every day | ORAL | Status: DC
  Administered 2020-11-03 – 2020-11-04 (×2): 20 mg via ORAL
  Filled 2020-11-03 (×2): qty 2

## 2020-11-03 MED ORDER — WITCH HAZEL-GLYCERIN EX PADS
1.0000 "application " | MEDICATED_PAD | CUTANEOUS | Status: DC | PRN
  Filled 2020-11-03: qty 100

## 2020-11-03 MED ORDER — ACETAMINOPHEN 325 MG PO TABS
650.0000 mg | ORAL_TABLET | ORAL | Status: DC | PRN
  Administered 2020-11-03: 650 mg via ORAL
  Filled 2020-11-03: qty 2

## 2020-11-03 MED ORDER — ONDANSETRON HCL 4 MG/2ML IJ SOLN: 4.0000 mg | INTRAMUSCULAR | Status: DC | PRN

## 2020-11-03 MED ORDER — OXYCODONE-ACETAMINOPHEN 5-325 MG PO TABS: 1.0000 | ORAL_TABLET | ORAL | Status: DC | PRN

## 2020-11-03 MED ORDER — COCONUT OIL OIL: 1.0000 "application " | TOPICAL_OIL | Status: DC | PRN

## 2020-11-03 NOTE — Lactation Note (Signed)
This note was copied from a baby's chart. Lactation Consultation Note  Patient Name: Claudia Byrd [id number] Date: 11/03/2020    Mom breast fed her first baby and reports that her milk never came in so has no intention of breast feeding this baby.  She brought her own formula from [facility] and her own Dr Saul Fordyce Bottle to use.  Hand out given on infant formula preparation and reviewed cleaning and sanitizing the workspace, bottles and preparation equipment, mixing with water from a safe water source, how to prevent cronobacter, pacifiers, cue-based feeding and holding baby close and eye contact during feedings. Discussed drying up her milk reviewing cold and cabbage leaves, no warmth or stimulation and wearing well fitting (but not too binding) supportive bra.  Discussed differences, prevention, management, treatment and when to seek further help with full breasts, plugged ducts, engorgement and mastitis.  Baby was just circumcised.  Discussed care after gomco circumcision. Age:48 hours   Maternal Data    Feeding    LATCH Score                    Lactation Tools Discussed/Used    Interventions    Discharge    Consult Status      [name] 11/03/2020, 1:45 PM

## 2020-11-03 NOTE — Discharge Summary (Signed)
OB Discharge Summary     Patient Name: Claudia Byrd DOB: Nov 21, 1990 MRN: [id number]  Date of admission: 11/02/2020 Delivering MD: [name]   Date of discharge: 11/04/2020  Admitting diagnosis: Encounter for induction of labor [Z34.90] Intrauterine pregnancy: [redacted]w[redacted]d     Secondary diagnosis:  Active Problems:   Encounter for induction of labor   SVD (spontaneous vaginal delivery)  Additional problems: None     Discharge diagnosis: Term Pregnancy Delivered                                                                                                Post partum procedures:none  Augmentation: AROM and Cytotec  Complications: None  Hospital course:  Induction of Labor With Vaginal Delivery   30 y.o. yo G2P1001 at [redacted]w[redacted]d was admitted to the hospital 11/02/2020 for induction of labor.  Indication for induction: CHTN no meds.  Patient had an uncomplicated labor course as follows: Membrane Rupture Time/Date: 8:26 PM ,11/02/2020   Delivery Method:Vaginal, Spontaneous  Episiotomy: None  Lacerations:  1st degree  Details of delivery can be found in separate delivery note.  Patient had a routine postpartum course. Patient is discharged home 11/04/20.  Newborn Data: Birth date:11/03/2020  Birth time:3:39 AM  Gender:Female  Living status:Living  Apgars:2 ,2  Weight:4170 g   Physical exam  Vitals:   11/03/20 1154 11/03/20 1500 11/03/20 2325 11/04/20 0814  BP: 115/63 116/67 128/86 127/84  Pulse: 68 80 95 87  Resp: 18 18  18   Temp: 98.4 F (36.9 C) 98 F (36.7 C) 97.7 F (36.5 C) 97.7 F (36.5 C)  TempSrc: Oral Oral Oral Oral  SpO2: 99% 100% 99% 100%   General: alert, cooperative and no distress Lochia: appropriate Uterine Fundus: firm Incision: Healing well with no significant drainage DVT Evaluation: No evidence of DVT seen on physical exam. Labs: Lab Results  Component Value Date   WBC 14.8 (H) 11/03/2020   HGB 9.2 (L) 11/03/2020   HCT 27.7 (L) 11/03/2020   MCV  84.2 11/03/2020   PLT 213 11/03/2020   CMP Latest Ref Rng & Units 11/02/2020  Glucose 70 - 99 mg/dL 138(H)  BUN 6 - 20 mg/dL 7  Creatinine 0.44 - 1.00 mg/dL 0.51  Sodium 135 - 145 mmol/L 133(L)  Potassium 3.5 - 5.1 mmol/L 3.4(L)  Chloride 98 - 111 mmol/L 105  CO2 22 - 32 mmol/L 17(L)  Calcium 8.9 - 10.3 mg/dL 8.7(L)  Total Protein 6.5 - 8.1 g/dL 6.4(L)  Total Bilirubin 0.3 - 1.2 mg/dL 0.4  Alkaline Phos 38 - 126 U/L 160(H)  AST 15 - 41 U/L 29  ALT 0 - 44 U/L 16    Discharge instruction: per After Visit Summary and "Baby and Me Booklet".  After visit meds:  Allergies as of 11/04/2020      Reactions   Penicillins Itching   Did it involve swelling of the face/tongue/throat, SOB, or low BP? No Did it involve sudden or severe rash/hives, skin peeling, or any reaction on the inside of your mouth or nose? No Did you need to seek medical attention  at a hospital or doctor's office? No When did it last happen?may 2020 If all above answers are "NO", may proceed with cephalosporin use.   Sulfa Antibiotics    Unknown, childhood allergy      Medication List    TAKE these medications   aspirin EC 81 MG tablet Take 81 mg by mouth daily. Swallow whole.   escitalopram 20 MG tablet Commonly known as: LEXAPRO Take 1 tablet (20 mg total) by mouth daily.   ibuprofen 600 MG tablet Commonly known as: ADVIL Take 1 tablet (600 mg total) by mouth every 6 (six) hours as needed.   loratadine 10 MG tablet Commonly known as: CLARITIN Take 10 mg by mouth daily.   norethindrone 0.35 MG tablet Commonly known as: Camila Take 1 tablet (0.35 mg total) by mouth daily.   omeprazole 20 MG tablet Commonly known as: PRILOSEC OTC Take 20 mg by mouth daily as needed (acid reflux).   prenatal multivitamin Tabs tablet Take 1 tablet by mouth daily at 12 noon.   VITAMIN D PO Take by mouth.            Discharge Care Instructions  (From admission, onward)         Start     Ordered    11/04/20 0000  Discharge wound care:       Comments: SHOWER DAILY Wash incision gently with soap and water.  Call office with any drainage, redness, or firmness of the incision.   11/04/20 1053          Diet: routine diet  Activity: Advance as tolerated. Pelvic rest for 6 weeks.   Outpatient follow up:1 week Follow up Appt: Future Appointments  Date Time Provider [place]  11/13/2020 11:30 AM [name], MD [facility] None   Follow up Visit:No follow-ups on file.  Postpartum contraception: Progesterone only pills  Newborn Data: Live born child  Birth Weight:   APGAR: ,   Newborn Delivery   Birth date/time: 11/03/2020 03:39:00 Delivery type: Vaginal, Spontaneous      Baby Feeding: Bottle Disposition:home with mother   11/04/2020 Homero Fellers, MD

## 2020-11-04 MED ORDER — NORETHINDRONE 0.35 MG PO TABS: 1.0000 | ORAL_TABLET | Freq: Every day | ORAL | 2 refills | Status: DC

## 2020-11-04 MED ORDER — IBUPROFEN 600 MG PO TABS: 600.0000 mg | ORAL_TABLET | Freq: Four times a day (QID) | ORAL | 0 refills | Status: DC | PRN

## 2020-11-04 NOTE — Discharge Instructions (Signed)

## 2020-11-04 NOTE — Anesthesia Postprocedure Evaluation (Signed)
Anesthesia Post Note  Patient: Claudia Byrd  Procedure(s) Performed: AN AD HOC LABOR EPIDURAL  Patient location during evaluation: Mother Baby Anesthesia Type: Epidural Level of consciousness: awake and alert Pain management: pain level controlled Vital Signs Assessment: post-procedure vital signs reviewed and stable Respiratory status: spontaneous breathing, nonlabored ventilation and respiratory function stable Cardiovascular status: stable Postop Assessment: no headache, no backache and epidural receding Anesthetic complications: no   No complications documented.   Last Vitals:  Vitals:   11/03/20 2325 11/04/20 0814  BP: 128/86 127/84  Pulse: 95 87  Resp:  18  Temp: 36.5 C 36.5 C  SpO2: 99% 100%    Last Pain:  Vitals:   11/04/20 0814  TempSrc: Oral  PainSc:                  [name]

## 2020-11-04 NOTE — Progress Notes (Signed)
Subjective:  She is feeling well and would like to be discharged home.  Pain control is asequate. Voiding without difficulty. Tolerating a regular diet. Ambulating well.  Objective:   Blood pressure 127/84, pulse 87, temperature 97.7 F (36.5 C), temperature source Oral, resp. rate 18, last menstrual period 02/01/2020, SpO2 100 %, unknown if currently breastfeeding.  General: NAD Pulmonary: no increased work of breathing Abdomen: non-distended, non-tender Uterus:  fundus firm at U; lochia normal Extremities: no edema, no erythema, no tenderness, no signs of DVT  Results for orders placed or performed during the hospital encounter of 11/02/20 (from the past 72 hour(s))  CBC     Status: Abnormal   Collection Time: 11/02/20  5:03 AM  Result Value Ref Range   WBC 11.7 (H) 4.0 - 10.5 K/uL   RBC 4.13 3.87 - 5.11 MIL/uL   Hemoglobin 11.5 (L) 12.0 - 15.0 g/dL   HCT 33.7 (L) 36.0 - 46.0 %   MCV 81.6 80.0 - 100.0 fL   MCH 27.8 26.0 - 34.0 pg   MCHC 34.1 30.0 - 36.0 g/dL   RDW 13.3 11.5 - 15.5 %   Platelets 262 150 - 400 K/uL   nRBC 0.0 0.0 - 0.2 %    Comment: Performed at [facility], [address] [place], [place] [address]  Type and screen     Status: None   Collection Time: 11/02/20  5:03 AM  Result Value Ref Range   ABO/RH(D) O POS    Antibody Screen NEG    Sample Expiration      11/05/2020,2359 Performed at [facility], [address] [place], [place] [address]   RPR     Status: None   Collection Time: 11/02/20  5:03 AM  Result Value Ref Range   RPR Ser Ql NON REACTIVE NON REACTIVE    Comment: Performed at [facility], [address] [place], [place] [address]  Comprehensive metabolic panel     Status: Abnormal   Collection Time: 11/02/20  5:03 AM  Result Value Ref Range   Sodium 133 (L) 135 - 145 mmol/L   Potassium 3.4 (L) 3.5 - 5.1 mmol/L   Chloride 105 98 - 111 mmol/L   CO2 17 (L) 22 - 32 mmol/L   Glucose, Bld 138 (H) 70 - 99 mg/dL     Comment: Glucose reference range applies only to samples taken after fasting for at least 8 hours.   BUN 7 6 - 20 mg/dL   Creatinine, Ser 0.51 0.44 - 1.00 mg/dL   Calcium 8.7 (L) 8.9 - 10.3 mg/dL   Total Protein 6.4 (L) 6.5 - 8.1 g/dL   Albumin 2.8 (L) 3.5 - 5.0 g/dL   AST 29 15 - 41 U/L   ALT 16 0 - 44 U/L   Alkaline Phosphatase 160 (H) 38 - 126 U/L   Total Bilirubin 0.4 0.3 - 1.2 mg/dL   GFR, Estimated >60 >60 mL/min    Comment: (NOTE) Calculated using the CKD-EPI Creatinine Equation (2021)    Anion gap 11 5 - 15    Comment: Performed at [facility], [address] [place], [place] [address]  Protein / creatinine ratio, urine     Status: None   Collection Time: 11/02/20  5:03 AM  Result Value Ref Range   Creatinine, Urine 157 mg/dL   Total Protein, Urine 14 mg/dL    Comment: NO NORMAL RANGE ESTABLISHED FOR THIS TEST   Protein Creatinine Ratio 0.09 0.00 -  0.15 mg/mg[Cre]    Comment: Performed at [facility], [address] [place], [place] [address]  CBC     Status: Abnormal   Collection Time: 11/03/20  7:11 AM  Result Value Ref Range   WBC 14.8 (H) 4.0 - 10.5 K/uL   RBC 3.29 (L) 3.87 - 5.11 MIL/uL   Hemoglobin 9.2 (L) 12.0 - 15.0 g/dL   HCT 27.7 (L) 36.0 - 46.0 %   MCV 84.2 80.0 - 100.0 fL   MCH 28.0 26.0 - 34.0 pg   MCHC 33.2 30.0 - 36.0 g/dL   RDW 13.4 11.5 - 15.5 %   Platelets 213 150 - 400 K/uL   nRBC 0.0 0.0 - 0.2 %    Comment: Performed at [facility], [address], [place], [place] [address]    Assessment:   30 y.o. [phone] postpartum day # 1  Plan:   1) Acute blood loss anemia - hemodynamically stable and asymptomatic - po ferrous sulfate  2) Blood Type --/--/O POS (05/13 0503)   3) Rubella 1.50 (10/06 1123) / Varicella Immune  4) TDAP status - up to date Immunization History  Administered Date(s) Administered  . DTaP 11/13/1994  . Influenza Split 04/19/2014  . Influenza,inj,Quad PF,6+ Mos 03/04/2019, 05/04/2020   . MMR 11/13/1994, 10/26/2017  . OPV 11/13/1994  . PFIZER(Purple Top)SARS-COV-2 Vaccination 08/20/2019, 09/14/2019, 03/26/2020  . PPD Test 09/30/2011, 10/23/2017  . Pneumococcal Conjugate-13 04/18/1999  . Tdap 10/29/2011, 06/13/2016, 08/31/2018, 09/26/2020    5) Feeding- Bottle   6) Contraception - POP then OCP at 6 weeks  7) Disposition - home today  Adrian Prows MD [place], [place] 11/04/2020 10:54 AM

## 2020-11-04 NOTE — Progress Notes (Signed)
Patient discharged with infant. Discharge instructions, prescriptions, and follow up appointments given to and reviewed with patient. Patient verbalized understanding. Escorted out by axillary.  

## 2020-11-13 ENCOUNTER — Other Ambulatory Visit: Payer: Self-pay

## 2020-11-13 ENCOUNTER — Encounter: Payer: Self-pay | Admitting: Obstetrics and Gynecology

## 2020-11-13 ENCOUNTER — Ambulatory Visit (INDEPENDENT_AMBULATORY_CARE_PROVIDER_SITE_OTHER): Payer: BC Managed Care – PPO | Admitting: Obstetrics and Gynecology

## 2020-11-13 VITALS — BP 126/74 | Ht 69.0 in | Wt 307.0 lb

## 2020-11-13 DIAGNOSIS — Z0130 Encounter for examination of blood pressure without abnormal findings: Secondary | ICD-10-CM

## 2020-11-13 NOTE — Progress Notes (Signed)
Obstetrics & Gynecology Office Visit   Chief Complaint:  Chief Complaint  Patient presents with  . Post-op Follow-up    1 wk postpartum - Upper abdomen sore. RM 6    History of Present Illness: 30 y.o. [id number] being seen for follow up blood pressure check today.  The patient is postpartum day 10The established diagnosis for the patient is chronic hypertension.  She is currently on no antihypertensives.  She reports no current symptoms attributable to her blood pressure.  Medication list reviewed medications which may contribute to BP elevation were not noted.  Review of Systems: Review of Systems  Constitutional: Negative.   Gastrointestinal: Negative.   Genitourinary: Negative.   Psychiatric/Behavioral: Negative.      Past Medical History:  Past Medical History:  Diagnosis Date  . Family history of breast cancer    7/21 genetic testing letter sent  . GERD (gastroesophageal reflux disease)   . Hypertension    no longer hypertensive    Past Surgical History:  Past Surgical History:  Procedure Laterality Date  . COLONOSCOPY WITH PROPOFOL N/A 05/27/2019   Procedure: COLONOSCOPY WITH PROPOFOL;  Surgeon: Lucilla Lame, MD;  Location: [facility] ENDOSCOPY;  Service: Endoscopy;  Laterality: N/A;  . DIAGNOSTIC LAPAROSCOPY    . LAPAROSCOPIC OVARIAN CYSTECTOMY Right 02/24/2019   Procedure: LAPAROSCOPIC RIGHT OVARIAN CYSTECTOMY;  Surgeon: [name], MD;  Location: [facility] ORS;  Service: Gynecology;  Laterality: Right;  . TONSILLECTOMY      Gynecologic History: No LMP recorded.  Obstetric History: [id number]  Family History:  Family History  Problem Relation Age of Onset  . Breast cancer Other 50  . Breast cancer Maternal Uncle 53    Social History:  Social History   Socioeconomic History  . Marital status: Married    Spouse name: Not on file  . Number of children: Not on file  . Years of education: Not on file  . Highest education level: Not on file  Occupational  History  . Not on file  Tobacco Use  . Smoking status: Never Smoker  . Smokeless tobacco: Never Used  Vaping Use  . Vaping Use: Never used  Substance and Sexual Activity  . Alcohol use: Never  . Drug use: Never  . Sexual activity: Yes    Partners: Male    Birth control/protection: Pill  Other Topics Concern  . Not on file  Social History Narrative  . Not on file   Social Determinants of Health   Financial Resource Strain: Not on file  Food Insecurity: Not on file  Transportation Needs: Not on file  Physical Activity: Not on file  Stress: Not on file  Social Connections: Not on file  Intimate Partner Violence: Not on file    Allergies:  Allergies  Allergen Reactions  . Penicillins Itching    Did it involve swelling of the face/tongue/throat, SOB, or low BP? No Did it involve sudden or severe rash/hives, skin peeling, or any reaction on the inside of your mouth or nose? No Did you need to seek medical attention at a hospital or doctor's office? No When did it last happen?may 2020 If all above answers are "NO", may proceed with cephalosporin use.   . Sulfa Antibiotics     Unknown, childhood allergy    Medications: Prior to Admission medications   Medication Sig Start Date End Date Taking? Authorizing Provider  aspirin EC 81 MG tablet Take 81 mg by mouth daily. Swallow whole.    [provider]  escitalopram (LEXAPRO) 20 MG tablet Take 1 tablet (20 mg total) by mouth daily. 08/13/20   [name], MD  ibuprofen (ADVIL) 600 MG tablet Take 1 tablet (600 mg total) by mouth every 6 (six) hours as needed. 11/04/20   Schuman, Stefanie Libel, MD  loratadine (CLARITIN) 10 MG tablet Take 10 mg by mouth daily.    [provider]  norethindrone (CAMILA) 0.35 MG tablet Take 1 tablet (0.35 mg total) by mouth daily. 11/04/20   Schuman, Stefanie Libel, MD  omeprazole (PRILOSEC OTC) 20 MG tablet Take 20 mg by mouth daily as needed (acid reflux).    [provider]  Prenatal Vit-Fe Fumarate-FA (PRENATAL MULTIVITAMIN) TABS tablet Take 1 tablet by mouth daily at 12 noon.    [provider]  VITAMIN D PO Take by mouth.    [provider]    Physical Exam Blood pressure 126/74, height _0  (1.753 m), weight (!) 307 lb (139.3 kg), unknown if currently breastfeeding.  No LMP recorded.  General: NAD HEENT: normocephalic, anicteric Pulmonary: No increased work of breathing Cardiovascular: RRR, distal pulses 2+ Extremities: noedema, no erythema, no tenderness Neurologic: Grossly intact Psychiatric: mood appropriate, affect full  Assessment: 30 y.o. D3P1225 presenting for blood pressure evaluation today  Plan: Problem List Items Addressed This Visit   None     1) Blood pressure - blood pressure at today's visit is normotensive.  As a result antihypertensive therapy is currently not warranted. - additional blood work was not obtained and 24-hr urine catecholamines   - interested in phentermine, BRCA testing.  Will arrange at 6 week postpartum visit  - OCP for contraception, bottle feeding   [name], MD, [place], [place] 11/13/2020, 11:59 AM    [name], MD, [place], [place] 11/13/2020, 11:44 AM

## 2020-11-14 ENCOUNTER — Emergency Department: Payer: BC Managed Care – PPO

## 2020-11-14 ENCOUNTER — Encounter: Payer: Self-pay | Admitting: Emergency Medicine

## 2020-11-14 ENCOUNTER — Inpatient Hospital Stay
Admission: EM | Admit: 2020-11-14 | Discharge: 2020-11-16 | DRG: 776 | Disposition: A | Discharge status: Home / Self Care | Payer: BC Managed Care – PPO | Attending: Obstetrics and Gynecology | Admitting: Obstetrics and Gynecology

## 2020-11-14 ENCOUNTER — Other Ambulatory Visit: Payer: Self-pay

## 2020-11-14 DIAGNOSIS — O9913 Other diseases of the blood and blood-forming organs and certain disorders involving the immune mechanism complicating the puerperium: Secondary | ICD-10-CM | POA: Diagnosis not present

## 2020-11-14 DIAGNOSIS — O8612 Endometritis following delivery: Secondary | ICD-10-CM | POA: Diagnosis not present

## 2020-11-14 DIAGNOSIS — A419 Sepsis, unspecified organism: Secondary | ICD-10-CM

## 2020-11-14 DIAGNOSIS — O99893 Other specified diseases and conditions complicating puerperium: Secondary | ICD-10-CM | POA: Diagnosis not present

## 2020-11-14 DIAGNOSIS — O1093 Unspecified pre-existing hypertension complicating the puerperium: Secondary | ICD-10-CM | POA: Diagnosis not present

## 2020-11-14 DIAGNOSIS — Z880 Allergy status to penicillin: Secondary | ICD-10-CM

## 2020-11-14 DIAGNOSIS — D72829 Elevated white blood cell count, unspecified: Secondary | ICD-10-CM

## 2020-11-14 DIAGNOSIS — N719 Inflammatory disease of uterus, unspecified: Secondary | ICD-10-CM

## 2020-11-14 DIAGNOSIS — Z20822 Contact with and (suspected) exposure to covid-19: Secondary | ICD-10-CM | POA: Diagnosis present

## 2020-11-14 DIAGNOSIS — R109 Unspecified abdominal pain: Secondary | ICD-10-CM | POA: Diagnosis not present

## 2020-11-14 LAB — COMPREHENSIVE METABOLIC PANEL
ALT: 31 U/L (ref 0–44)
AST: 35 U/L (ref 15–41)
Albumin: 3.4 g/dL — ABNORMAL LOW (ref 3.5–5.0)
Alkaline Phosphatase: 146 U/L — ABNORMAL HIGH (ref 38–126)
Anion gap: 11 (ref 5–15)
BUN: 12 mg/dL (ref 6–20)
CO2: 21 mmol/L — ABNORMAL LOW (ref 22–32)
Calcium: 8.5 mg/dL — ABNORMAL LOW (ref 8.9–10.3)
Chloride: 106 mmol/L (ref 98–111)
Creatinine, Ser: 0.69 mg/dL (ref 0.44–1.00)
GFR, Estimated: >60 mL/min (ref >60)
Glucose, Bld: 113 mg/dL — ABNORMAL HIGH (ref 70–99)
Potassium: 3.8 mmol/L (ref 3.5–5.1)
Sodium: 138 mmol/L (ref 135–145)
Total Bilirubin: 0.6 mg/dL (ref 0.3–1.2)
Total Protein: 6.8 g/dL (ref 6.5–8.1)

## 2020-11-14 LAB — URINALYSIS, COMPLETE (UACMP) WITH MICROSCOPIC
RBC / HPF: >50 RBC/hpf — ABNORMAL HIGH (ref 0–5)
Specific Gravity, Urine: 1.012 (ref 1.005–1.030)
WBC, UA: >50 WBC/hpf — ABNORMAL HIGH (ref 0–5)

## 2020-11-14 LAB — CBC WITH DIFFERENTIAL/PLATELET
Abs Immature Granulocytes: 0.2 10*3/uL — ABNORMAL HIGH (ref 0.00–0.07)
Basophils Absolute: 0.1 10*3/uL (ref 0.0–0.1)
Basophils Relative: 0 %
Eosinophils Absolute: 0.1 10*3/uL (ref 0.0–0.5)
Eosinophils Relative: 0 %
HCT: 31.7 % — ABNORMAL LOW (ref 36.0–46.0)
Hemoglobin: 10.3 g/dL — ABNORMAL LOW (ref 12.0–15.0)
Immature Granulocytes: 1 %
Lymphocytes Relative: 12 %
Lymphs Abs: 3 10*3/uL (ref 0.7–4.0)
MCH: 27.2 pg (ref 26.0–34.0)
MCHC: 32.5 g/dL (ref 30.0–36.0)
MCV: 83.6 fL (ref 80.0–100.0)
Monocytes Absolute: 1.1 10*3/uL — ABNORMAL HIGH (ref 0.1–1.0)
Monocytes Relative: 4 %
Neutro Abs: 20.1 10*3/uL — ABNORMAL HIGH (ref 1.7–7.7)
Neutrophils Relative %: 83 %
Platelets: 447 10*3/uL — ABNORMAL HIGH (ref 150–400)
RBC: 3.79 MIL/uL — ABNORMAL LOW (ref 3.87–5.11)
RDW: 13.3 % (ref 11.5–15.5)
WBC: 24.5 10*3/uL — ABNORMAL HIGH (ref 4.0–10.5)
nRBC: 0 % (ref 0.0–0.2)

## 2020-11-14 LAB — LACTIC ACID, PLASMA
Lactic Acid, Venous: 3.4 mmol/L (ref 0.5–1.9)
Lactic Acid, Venous: 2.4 mmol/L (ref 0.5–1.9)

## 2020-11-14 LAB — HCG, QUANTITATIVE, PREGNANCY: hCG, Beta Chain, Quant, S: 4 m[IU]/mL (ref <5)

## 2020-11-14 LAB — RESP PANEL BY RT-PCR (FLU A&B, COVID) ARPGX2
Influenza A by PCR: NEGATIVE
Influenza B by PCR: NEGATIVE
SARS Coronavirus 2 by RT PCR: NEGATIVE

## 2020-11-14 MED ORDER — CLINDAMYCIN PHOSPHATE 900 MG/50ML IV SOLN
900.0000 mg | Freq: Once | INTRAVENOUS | Status: AC
  Administered 2020-11-14: 900 mg via INTRAVENOUS
  Filled 2020-11-14: qty 50

## 2020-11-14 MED ORDER — GENTAMICIN SULFATE 40 MG/ML IJ SOLN
1.5000 mg/kg | Freq: Once | INTRAVENOUS | Status: AC
  Administered 2020-11-15: 210 mg via INTRAVENOUS
  Filled 2020-11-14: qty 5.25

## 2020-11-14 MED ORDER — LACTATED RINGERS IV BOLUS (SEPSIS)
1000.0000 mL | Freq: Once | INTRAVENOUS | Status: AC
  Administered 2020-11-14: 1000 mL via INTRAVENOUS

## 2020-11-14 MED ORDER — LACTATED RINGERS IV BOLUS (SEPSIS)
200.0000 mL | Freq: Once | INTRAVENOUS | Status: AC
  Administered 2020-11-14: 200 mL via INTRAVENOUS

## 2020-11-14 MED ORDER — IOHEXOL 300 MG/ML  SOLN
125.0000 mL | Freq: Once | INTRAMUSCULAR | Status: AC | PRN
  Administered 2020-11-14: 125 mL via INTRAVENOUS

## 2020-11-14 NOTE — ED Triage Notes (Signed)
Pt to ED from home c/o fever at home of 101.8, cramping, and vaginal bleeding.  States gave birth with second child on 5/14 with healthy pregnancy.  Took 600mg  ibuprofen around 1600 today. Denies n/v/d or SOB.  States vaginal bleeding tapered off and came back like regular period today, denies clots.

## 2020-11-14 NOTE — ED Provider Notes (Signed)
[facility] Emergency Department Provider Note ____________________________________________   Event Date/Time   First MD Initiated Contact with Patient 11/14/20 2151     (approximate)  I have reviewed the triage vital signs and the nursing notes.   HISTORY  Chief Complaint Fever and Vaginal Bleeding    HPI Claudia Byrd is a 30 y.o. female with PMH as noted below and status post vaginal delivery on 2/59 after an uncomplicated pregnancy who presents with fever to 101, cute onset today, and associated with right-sided lower abdominal pain and increased vaginal bleeding.  The patient states that she has had mild vaginal spotting since discharge from the hospital, but it increased today.  The patient denies any cough, shortness of breath, vomiting, diarrhea, or urinary symptoms.   Past Medical History:  Diagnosis Date  . Family history of breast cancer    7/21 genetic testing letter sent  . GERD (gastroesophageal reflux disease)   . Hypertension    no longer hypertensive    Patient Active Problem List   Diagnosis Date Noted  . SVD (spontaneous vaginal delivery)   . Encounter for induction of labor 11/02/2020  . Maternal obesity, antepartum 03/28/2020  . Family history of colon cancer   . Supervision of high risk pregnancy, antepartum 03/10/2018  . Chronic hypertension during pregnancy, antepartum 03/10/2018  . Obesity affecting pregnancy 03/10/2018    Past Surgical History:  Procedure Laterality Date  . COLONOSCOPY WITH PROPOFOL N/A 05/27/2019   Procedure: COLONOSCOPY WITH PROPOFOL;  Surgeon: Lucilla Lame, MD;  Location: [facility] ENDOSCOPY;  Service: Endoscopy;  Laterality: N/A;  . DIAGNOSTIC LAPAROSCOPY    . LAPAROSCOPIC OVARIAN CYSTECTOMY Right 02/24/2019   Procedure: LAPAROSCOPIC RIGHT OVARIAN CYSTECTOMY;  Surgeon: [name], MD;  Location: [facility] ORS;  Service: Gynecology;  Laterality: Right;  . TONSILLECTOMY      Prior to Admission  medications   Medication Sig Start Date End Date Taking? Authorizing Provider  aspirin EC 81 MG tablet Take 81 mg by mouth daily. Swallow whole.    [provider]  escitalopram (LEXAPRO) 20 MG tablet Take 1 tablet (20 mg total) by mouth daily. 08/13/20   [name], MD  ibuprofen (ADVIL) 600 MG tablet Take 1 tablet (600 mg total) by mouth every 6 (six) hours as needed. 11/04/20   Schuman, Stefanie Libel, MD  loratadine (CLARITIN) 10 MG tablet Take 10 mg by mouth daily.    [provider]  norethindrone (CAMILA) 0.35 MG tablet Take 1 tablet (0.35 mg total) by mouth daily. 11/04/20   Schuman, Stefanie Libel, MD  omeprazole (PRILOSEC OTC) 20 MG tablet Take 20 mg by mouth daily as needed (acid reflux).    [provider]  Prenatal Vit-Fe Fumarate-FA (PRENATAL MULTIVITAMIN) TABS tablet Take 1 tablet by mouth daily at 12 noon.    [provider]  VITAMIN D PO Take by mouth.    [provider]    Allergies Penicillins and Sulfa antibiotics  Family History  Problem Relation Age of Onset  . Breast cancer Other 50  . Breast cancer Maternal Uncle 67    Social History Social History   Tobacco Use  . Smoking status: Never Smoker  . Smokeless tobacco: Never Used  Vaping Use  . Vaping Use: Never used  Substance Use Topics  . Alcohol use: Never  . Drug use: Never    Review of Systems  Constitutional: Positive for fever. Eyes: No redness. ENT: No sore throat. Cardiovascular: Denies chest pain. Respiratory: Denies  shortness of breath. Gastrointestinal: No vomiting or diarrhea.  Genitourinary: Negative for dysuria.  Positive for vaginal bleeding. Musculoskeletal: Negative for back pain. Skin: Negative for rash. Neurological: Negative for headache.   ____________________________________________   PHYSICAL EXAM:  VITAL SIGNS: ED Triage Vitals  Enc Vitals Group     BP 11/14/20 1954 (!) 141/100     Pulse Rate 11/14/20 1954 (!) 130      Resp 11/14/20 1954 16     Temp 11/14/20 1954 98.4 F (36.9 C)     Temp Source 11/14/20 1954 Oral     SpO2 11/14/20 1954 98 %     Weight 11/14/20 1955 (!) 307 lb (139.3 kg)     Height 11/14/20 1955 5\' 9"  (1.753 m)     Head Circumference --      Peak Flow --      Pain Score 11/14/20 1954 3     Pain Loc --      Pain Edu? --      Excl. in [place]? --     Constitutional: Alert and oriented. Well appearing and in no acute distress. Eyes: Conjunctivae are normal.  Head: Atraumatic. Nose: No congestion/rhinnorhea. Mouth/Throat: Mucous membranes are moist.   Neck: Normal range of motion.  Cardiovascular: Tachycardic,, regular rhythm. Grossly normal heart sounds.  Good peripheral circulation. Respiratory: Normal respiratory effort.  No retractions. Lungs CTAB. Gastrointestinal: Soft with mild suprapubic tenderness.  No distention.  Genitourinary: No flank tenderness. Musculoskeletal: Extremities warm and well perfused.  Neurologic:  Normal speech and language. No gross focal neurologic deficits are appreciated.  Skin:  Skin is warm and dry. No rash noted. Psychiatric: Mood and affect are normal. Speech and behavior are normal.  ____________________________________________   LABS (all labs ordered are listed, but only abnormal results are displayed)  Labs Reviewed  COMPREHENSIVE METABOLIC PANEL - Abnormal; Notable for the following components:      Result Value   CO2 21 (*)    Glucose, Bld 113 (*)    Calcium 8.5 (*)    Albumin 3.4 (*)    Alkaline Phosphatase 146 (*)    All other components within normal limits  LACTIC ACID, PLASMA - Abnormal; Notable for the following components:   Lactic Acid, Venous 3.4 (*)    All other components within normal limits  LACTIC ACID, PLASMA - Abnormal; Notable for the following components:   Lactic Acid, Venous 2.4 (*)    All other components within normal limits  CBC WITH DIFFERENTIAL/PLATELET - Abnormal; Notable for the following components:    WBC 24.5 (*)    RBC 3.79 (*)    Hemoglobin 10.3 (*)    HCT 31.7 (*)    Platelets 447 (*)    Neutro Abs 20.1 (*)    Monocytes Absolute 1.1 (*)    Abs Immature Granulocytes 0.20 (*)    All other components within normal limits  URINALYSIS, COMPLETE (UACMP) WITH MICROSCOPIC - Abnormal; Notable for the following components:   Color, Urine RED (*)    APPearance CLOUDY (*)    Glucose, UA   (*)    Value: TEST NOT REPORTED DUE TO COLOR INTERFERENCE OF URINE PIGMENT   Hgb urine dipstick   (*)    Value: TEST NOT REPORTED DUE TO COLOR INTERFERENCE OF URINE PIGMENT   Bilirubin Urine   (*)    Value: TEST NOT REPORTED DUE TO COLOR INTERFERENCE OF URINE PIGMENT   Ketones, ur   (*)    Value: TEST NOT REPORTED DUE  TO COLOR INTERFERENCE OF URINE PIGMENT   Protein, ur   (*)    Value: TEST NOT REPORTED DUE TO COLOR INTERFERENCE OF URINE PIGMENT   Nitrite   (*)    Value: TEST NOT REPORTED DUE TO COLOR INTERFERENCE OF URINE PIGMENT   Leukocytes,Ua   (*)    Value: TEST NOT REPORTED DUE TO COLOR INTERFERENCE OF URINE PIGMENT   RBC / HPF >50 (*)    WBC, UA >50 (*)    Bacteria, UA FEW (*)    All other components within normal limits  RESP PANEL BY RT-PCR (FLU A&B, COVID) ARPGX2  CULTURE, BLOOD (ROUTINE X 2)  CULTURE, BLOOD (ROUTINE X 2)  URINE CULTURE  HCG, QUANTITATIVE, PREGNANCY   ____________________________________________  EKG  ED ECG REPORT I, Arta Silence, the attending physician, personally viewed and interpreted this ECG.  Date: 11/14/2020 EKG Time: 2232 Rate: 86 Rhythm: normal sinus rhythm QRS Axis: normal Intervals: normal ST/T Wave abnormalities: normal Narrative Interpretation: no evidence of acute ischemia  ____________________________________________  RADIOLOGY  Chest x-ray interpreted by me shows no focal consolidation or edema CT abdomen/pelvis: Pending US pelvis: Pending  ____________________________________________   PROCEDURES  Procedure(s) performed:  No  Procedures  Critical Care performed: Yes  CRITICAL CARE Performed by: Arta Silence   Total critical care time: 30 minutes  Critical care time was exclusive of separately billable procedures and treating other patients.  Critical care was necessary to treat or prevent imminent or life-threatening deterioration.  Critical care was time spent personally by me on the following activities: development of treatment plan with patient and/or surrogate as well as nursing, discussions with consultants, evaluation of patient's response to treatment, examination of patient, obtaining history from patient or surrogate, ordering and performing treatments and interventions, ordering and review of laboratory studies, ordering and review of radiographic studies, pulse oximetry and re-evaluation of patient's condition. ____________________________________________   INITIAL IMPRESSION / ASSESSMENT AND PLAN / ED COURSE  Pertinent labs & imaging results that were available during my care of the patient were reviewed by me and considered in my medical decision making (see chart for details).  30 year old female with PMH as noted above and status post vaginal delivery on 5/14 presents with fever, lower abdominal pain, and worsening vaginal bleeding over the last day.  I reviewed the past medical records in Epic and confirmed that the patient had a vaginal delivery on 5/14.  She was discharged the next day.  Her pregnancy and hospital stay were uncomplicated.  GBS was negative on 4/22.  On exam, the patient is overall very well-appearing.  She was tachycardic at triage with otherwise normal vital signs.  The abdomen is soft with mild suprapubic tenderness.  Physical exam is otherwise unremarkable.  Initial lab work-up obtained from triage is concerning for acute sepsis.  The patient has significant leukocytosis and an elevated lactate.  Differential includes endometritis, UTI/pyelonephritis,  appendicitis, colitis, or less likely COVID-19, bacterial pneumonia, or other source of infection.  Since endometritis appears most likely I have ordered empiric clindamycin and gentamicin and fluids per the sepsis protocol.  I will consult OB/GYN and obtain ultrasound to evaluate for retained POC's and a CT to evaluate for other sources of infection.  ----------------------------------------- 11:15 PM on 11/14/2020 -----------------------------------------  I consulted Dr. Georgianne Fick from OB/GYN who has come to see the patient.  He agrees with the current management.  He agrees with pelvic ultrasound and CT abdomen for further evaluation of the source of infection.  If  endometritis appears likely, he will plan to admit to the GYN service.  I signed the patient out to the oncoming ED physician Dr. Jari Pigg. ____________________________________________   FINAL CLINICAL IMPRESSION(S) / ED DIAGNOSES  Final diagnoses:  Endometritis  Sepsis, due to unspecified organism, unspecified whether acute organ dysfunction present [facility])      NEW MEDICATIONS STARTED DURING THIS VISIT:  New Prescriptions   No medications on file     Note:  This document was prepared using Dragon voice recognition software and may include unintentional dictation errors.   Arta Silence, MD 11/14/20 [phone]

## 2020-11-14 NOTE — Consult Note (Addendum)
Obstetrics & Gynecology Consult H&P   Consulting Department: Emergency Department  Consulting Physician: Arta Silence, MD  Consulting Question: Febrile, abdominal pain  History of Present Illness: Patient is a 30 y.o. [id number]#63 uncomplicated vaginal delivery who presents with increased abdominal pain and fevers.  She reports she noted chills today which prompted her to check her temperature at home.  She has noted some slight increase in vaginal bleeding but not significant.  No nausea, vomiting, loose stools, URI symptoms.  Not currently breastfeeding and no breast complaints concerning for possible endometritis.  Pain is described at 6/10, midline, and described as cramping.    Her delivery was an elective induction at [redacted]w[redacted]d.  She did not have a prolonged labor.  She was only ruptured for approximately 8-hrs.  GBS was negative and the patient did not receive an intrapartum or postpartum antibiotics.   H&H went from 11.5 & 33.7 on 11/02/2020 to 9.2 & 27.2 on PPD1 11/03/2020 and is stable today at 10.3 & 31.7.   WBC was 11.7 on 11/02/2020 and went to 14.8 on PPD1 11/03/2020 is now up to 24.5.  Lactate is 3.4.  Review of Systems:10 point review of systems negative unless noted in HPI  Past Medical History:  Patient Active Problem List   Diagnosis Date Noted  . SVD (spontaneous vaginal delivery)   . Encounter for induction of labor 11/02/2020  . Maternal obesity, antepartum 03/28/2020  . Family history of colon cancer   . Supervision of high risk pregnancy, antepartum 03/10/2018    Clinic [facility] Prenatal Labs  Dating EDD by LMP c/w 9w u/s Blood type: O/Positive/-- (09/18 1153)   Genetic Screen NIPS: Normal XY Inheritest: SMA, CF, Fragile-X negative 01/03/2018 Antibody:Negative (09/18 1153)  Anatomic [place] Complete 06/25/20 Rubella: 5.81 (09/18 1153)  Varicella: Immune  GTT Early: Hgb1C 5.3, 1-hr 131 Third trimester: 145 3-hr 86, 155, 146, 110 RPR: Non Reactive (09/18 1153)   Rhogam  N/A HBsAg: Negative (09/18 1153)   TDaP vaccine Flu Shot Covid  09/26/20  05/04/21 Pfeizer (booster as well) HIV: Non Reactive (09/18 1153)   Baby Food  Breast                               GBS:   Contraception  POP Pap: 03/10/18 NILM  CBB  no Pelvis tested to 8lbs 4oz (VAVD)  CS/VBAC N/A  [ placed 09/26/20] anesthesia referral  Support Person Husband [name]        . Chronic hypertension during pregnancy, antepartum 03/10/2018    [ ]  Aspirin 81 mg daily after 12 weeks; discontinue after 36 weeks [X]  baseline labs with CBC, CMP, urine protein/creatinine ratio [ ]  no BP meds unless BPs become elevated [ ]  ultrasound for growth at 28, 32, 36 weeks    Current antihypertensives:  None   Baseline and surveillance labs (pulled in from [facility], refresh links as needed)  Lab Results  Component Value Date   PLT 379 03/10/2018   CREATININE 0.70 03/10/2018   AST 18 03/10/2018   ALT 10 03/10/2018    Antenatal Testing CHTN - O10.919  Group I  BP < 140/90, no preeclampsia, AGA,  nml AFV, +/- meds    Group II BP > 140/90, on meds, no preeclampsia, AGA, nml AFV  20-28-34-38  20-24-28-32-35-38  32//2 x wk  28//BPP wkly then 32//2 x wk  40 no meds; 39 meds  PRN or 37  Pre-eclampsia  GHTN - O13.9/Preeclampsia without severe features  - O14.00   Preeclampsia with severe features - O14.10  Q 3-4wks  Q 2 wks  28//BPP wkly then 32//2 x wk  Inpatient  37  PRN or 34     . Obesity affecting pregnancy 03/10/2018    Antenatal Testing [ ]  BMI 35-39.9 Weekly at 37 weeks [x ] BMI 40 or >Weekly at 34 weeks      Past Surgical History:  Past Surgical History:  Procedure Laterality Date  . COLONOSCOPY WITH PROPOFOL N/A 05/27/2019   Procedure: COLONOSCOPY WITH PROPOFOL;  Surgeon: Lucilla Lame, MD;  Location: [facility] ENDOSCOPY;  Service: Endoscopy;  Laterality: N/A;  . DIAGNOSTIC LAPAROSCOPY    . LAPAROSCOPIC OVARIAN CYSTECTOMY Right 02/24/2019   Procedure: LAPAROSCOPIC RIGHT OVARIAN  CYSTECTOMY;  Surgeon: [name], MD;  Location: [facility] ORS;  Service: Gynecology;  Laterality: Right;  . TONSILLECTOMY      Gynecologic History:   Obstetric History: [id number]  Family History:  Family History  Problem Relation Age of Onset  . Breast cancer Other 50  . Breast cancer Maternal Uncle 75    Social History:  Social History   Socioeconomic History  . Marital status: Married    Spouse name: Not on file  . Number of children: Not on file  . Years of education: Not on file  . Highest education level: Not on file  Occupational History  . Not on file  Tobacco Use  . Smoking status: Never Smoker  . Smokeless tobacco: Never Used  Vaping Use  . Vaping Use: Never used  Substance and Sexual Activity  . Alcohol use: Never  . Drug use: Never  . Sexual activity: Yes    Partners: Male    Birth control/protection: Pill  Other Topics Concern  . Not on file  Social History Narrative  . Not on file   Social Determinants of Health   Financial Resource Strain: Not on file  Food Insecurity: Not on file  Transportation Needs: Not on file  Physical Activity: Not on file  Stress: Not on file  Social Connections: Not on file  Intimate Partner Violence: Not on file    Allergies:  Allergies  Allergen Reactions  . Penicillins Itching    Did it involve swelling of the face/tongue/throat, SOB, or low BP? No Did it involve sudden or severe rash/hives, skin peeling, or any reaction on the inside of your mouth or nose? No Did you need to seek medical attention at a hospital or doctor's office? No When did it last happen?may 2020 If all above answers are "NO", may proceed with cephalosporin use.   . Sulfa Antibiotics     Unknown, childhood allergy    Medications: Prior to Admission medications   Medication Sig Start Date End Date Taking? Authorizing Provider  aspirin EC 81 MG tablet Take 81 mg by mouth daily. Swallow whole.    [provider]   escitalopram (LEXAPRO) 20 MG tablet Take 1 tablet (20 mg total) by mouth daily. 08/13/20   [name], MD  ibuprofen (ADVIL) 600 MG tablet Take 1 tablet (600 mg total) by mouth every 6 (six) hours as needed. 11/04/20   Schuman, Stefanie Libel, MD  loratadine (CLARITIN) 10 MG tablet Take 10 mg by mouth daily.    [provider]  norethindrone (CAMILA) 0.35 MG tablet Take 1 tablet (0.35 mg total) by mouth daily. 11/04/20   Schuman, Stefanie Libel, MD  omeprazole (PRILOSEC OTC) 20 MG tablet Take 20 mg  by mouth daily as needed (acid reflux).    [provider]  Prenatal Vit-Fe Fumarate-FA (PRENATAL MULTIVITAMIN) TABS tablet Take 1 tablet by mouth daily at 12 noon.    [provider]  VITAMIN D PO Take by mouth.    [provider]    Physical Exam Vitals: Blood pressure (!) 141/100, pulse (!) 130, temperature 98.4 F (36.9 C), temperature source Oral, resp. rate 16, height 5\' 9"  (1.753 m), weight (!) 139.3 kg, SpO2 98 %, not currently breastfeeding.  General: NAD and non-ill appearing HEENT: normocephalic, anicteric Pulmonary: No increased work of breathing Cardiovascular: RRR, distal pulses 2+ Abdomen: soft, non-distended, no rebound, no guarding, fundus firm 4cm below umbilicus, mild fundal tenderness.  Also some mild RUQ pain.   Genitourinary: deferred Extremities: no edema, erythema, or tenderness Neurologic: Grossly intact Psychiatric: mood appropriate, affect full  Labs: Results for orders placed or performed during the hospital encounter of 11/14/20 (from the past 72 hour(s))  Comprehensive metabolic panel     Status: Abnormal   Collection Time: 11/14/20  7:57 PM  Result Value Ref Range   Sodium 138 135 - 145 mmol/L   Potassium 3.8 3.5 - 5.1 mmol/L   Chloride 106 98 - 111 mmol/L   CO2 21 (L) 22 - 32 mmol/L   Glucose, Bld 113 (H) 70 - 99 mg/dL    Comment: Glucose reference range applies only to samples taken after fasting for at least 8 hours.    BUN 12 6 - 20 mg/dL   Creatinine, Ser 0.69 0.44 - 1.00 mg/dL   Calcium 8.5 (L) 8.9 - 10.3 mg/dL   Total Protein 6.8 6.5 - 8.1 g/dL   Albumin 3.4 (L) 3.5 - 5.0 g/dL   AST 35 15 - 41 U/L   ALT 31 0 - 44 U/L   Alkaline Phosphatase 146 (H) 38 - 126 U/L   Total Bilirubin 0.6 0.3 - 1.2 mg/dL   GFR, Estimated >60 >60 mL/min    Comment: (NOTE) Calculated using the CKD-EPI Creatinine Equation (2021)    Anion gap 11 5 - 15    Comment: Performed at [facility], [address] [place], [place] [address]  CBC with Differential     Status: Abnormal   Collection Time: 11/14/20  7:57 PM  Result Value Ref Range   WBC 24.5 (H) 4.0 - 10.5 K/uL   RBC 3.79 (L) 3.87 - 5.11 MIL/uL   Hemoglobin 10.3 (L) 12.0 - 15.0 g/dL   HCT 31.7 (L) 36.0 - 46.0 %   MCV 83.6 80.0 - 100.0 fL   MCH 27.2 26.0 - 34.0 pg   MCHC 32.5 30.0 - 36.0 g/dL   RDW 13.3 11.5 - 15.5 %   Platelets 447 (H) 150 - 400 K/uL   nRBC 0.0 0.0 - 0.2 %   Neutrophils Relative % 83 %   Neutro Abs 20.1 (H) 1.7 - 7.7 K/uL   Lymphocytes Relative 12 %   Lymphs Abs 3.0 0.7 - 4.0 K/uL   Monocytes Relative 4 %   Monocytes Absolute 1.1 (H) 0.1 - 1.0 K/uL   Eosinophils Relative 0 %   Eosinophils Absolute 0.1 0.0 - 0.5 K/uL   Basophils Relative 0 %   Basophils Absolute 0.1 0.0 - 0.1 K/uL   Immature Granulocytes 1 %   Abs Immature Granulocytes 0.20 (H) 0.00 - 0.07 K/uL    Comment: Performed at [facility], [address] [place], [place] [address]  Lactic acid, plasma  Status: Abnormal   Collection Time: 11/14/20  8:10 PM  Result Value Ref Range   Lactic Acid, Venous 3.4 (HH) 0.5 - 1.9 mmol/L    Comment: CRITICAL RESULT CALLED TO, READ BACK BY AND VERIFIED WITH ALEXANDRA OLIVER AT 2127 ON 11/14/20 BY SS Performed at [facility], [address] [place], [place] [address]   Urinalysis, Complete w Microscopic     Status: Abnormal   Collection Time: 11/14/20  8:10 PM  Result Value Ref Range   Color, Urine  RED (A) YELLOW   APPearance CLOUDY (A) CLEAR   Specific Gravity, Urine 1.012 1.005 - 1.030   pH  5.0 - 8.0    TEST NOT REPORTED DUE TO COLOR INTERFERENCE OF URINE PIGMENT   Glucose, UA (A) NEGATIVE mg/dL    TEST NOT REPORTED DUE TO COLOR INTERFERENCE OF URINE PIGMENT   Hgb urine dipstick (A) NEGATIVE    TEST NOT REPORTED DUE TO COLOR INTERFERENCE OF URINE PIGMENT   Bilirubin Urine (A) NEGATIVE    TEST NOT REPORTED DUE TO COLOR INTERFERENCE OF URINE PIGMENT   Ketones, ur (A) NEGATIVE mg/dL    TEST NOT REPORTED DUE TO COLOR INTERFERENCE OF URINE PIGMENT   Protein, ur (A) NEGATIVE mg/dL    TEST NOT REPORTED DUE TO COLOR INTERFERENCE OF URINE PIGMENT   Nitrite (A) NEGATIVE    TEST NOT REPORTED DUE TO COLOR INTERFERENCE OF URINE PIGMENT   Leukocytes,Ua (A) NEGATIVE    TEST NOT REPORTED DUE TO COLOR INTERFERENCE OF URINE PIGMENT   RBC / HPF >50 (H) 0 - 5 RBC/hpf   WBC, UA >50 (H) 0 - 5 WBC/hpf   Bacteria, UA FEW (A) NONE SEEN   Squamous Epithelial / LPF 0-5 0 - 5   WBC Clumps PRESENT    Mucus PRESENT     Comment: Performed at [facility], [address], [place], [place] [address]    Imaging DG Chest [place] 1 View  Result Date: 11/14/2020 CLINICAL DATA:  Questionable sepsis - evaluate for abnormality Fever.  Recent postpartum. EXAM: PORTABLE CHEST 1 VIEW COMPARISON:  None. FINDINGS: Low lung volumes. Upper normal heart size likely accentuated by technique. No focal airspace disease. No large pleural effusion. No pulmonary edema. No pneumothorax. No acute osseous abnormalities are seen. IMPRESSION: Low lung volumes. No evidence of pneumonia or pulmonary edema. Electronically Signed   By: Keith Rake M.D.   On: 11/14/2020 22:19   [place] MFM OB DETAIL +14 WK  Result Date: 10/22/2020 ----------------------------------------------------------------------  OBSTETRICS REPORT                       (Signed Final 10/22/2020 12:47 pm)  ---------------------------------------------------------------------- Patient Info  ID #:       [id number]                          D.O.B.:  Sep 30, 1990 (29 yrs)  Name:       CAMREN HENTHORN                   Visit Date: 10/22/2020 09:34 am ---------------------------------------------------------------------- Performed By  Attending:        Johnell Comings MD         Referred By:      Jola Babinski OB/GYN  Performed By:     Hubert Azure          [facility]                                                             [facility]                                                             [facility] ---------------------------------------------------------------------- Orders  #  Description                           Code        Ordered By  1  [place] MFM OB DETAIL +14 [place]               76811.01    Prentice Docker ----------------------------------------------------------------------  #  Order #                     Accession #                Episode #  1  [id number]                   [id number]                 [id number] ---------------------------------------------------------------------- Indications  Obesity complicating pregnancy, third          O99.213  trimester (pre preg BMI 39.9)  Macrosomia                                     O36.60X0  Encounter for antenatal screening for          Z36.3  malformations  [redacted] weeks gestation of pregnancy                Z3A.37  Hypertension - Chronic/Pre-existing            O10.019 ---------------------------------------------------------------------- Fetal Evaluation  Num Of Fetuses:         1  Fetal Heart Rate(bpm):  152  Cardiac Activity:       Observed  Presentation:           Cephalic  Placenta:               Posterior  P. Cord Insertion:      Visualized, central  Amniotic Fluid  AFI FV:      Within normal limits  AFI Sum(cm)     %Tile       Largest Pocket(cm)  17.29           67          5.87   RUQ(cm)       RLQ(cm)  LUQ(cm)        LLQ(cm)  5.02          4.51          5.87           1.89 ---------------------------------------------------------------------- Biometry  BPD:     100.7  mm     G. Age:  41w 3d       > 99  %    CI:        77.73   %    70 - 86                                                          FL/HC:      18.8   %    20.9 - 22.7  HC:      361.5  mm     G. Age:  42w 5d       > 99  %    HC/AC:      0.96        0.92 - 1.05  AC:      375.4  mm     G. Age:  41w 3d       > 99  %    FL/BPD:     67.6   %    71 - 87  FL:       68.1  mm     G. Age:  35w 0d        3.4  %    FL/AC:      18.1   %    20 - 24  HUM:      62.5  mm     G. Age:  36w 2d         48  %  LV:          7  mm  Est. FW:    4087  gm           9 lb     99  % ---------------------------------------------------------------------- OB History  Gravidity:    2         Term:   1 ---------------------------------------------------------------------- Gestational Age  LMP:           37w 5d        Date:  02/01/20                 EDD:   11/07/20  U/S Today:     40w 1d                                        EDD:   10/21/20  Best:          37w 5d     Det. By:  LMP  (02/01/20)          EDD:   11/07/20 ---------------------------------------------------------------------- Anatomy  Cranium:               Appears normal         LVOT:                   Appears normal  Cavum:  Appears normal         Aortic Arch:            Not well visualized  Ventricles:            Appears normal         Ductal Arch:            Not well visualized  Choroid Plexus:        Appears normal         Diaphragm:              Appears normal  Cerebellum:            Appears normal         Stomach:                Appears normal, left                                                                        sided  Posterior Fossa:       Appears normal         Abdomen:                Appears normal  Nuchal Fold:           Not applicable (>38    Abdominal Wall:          Appears nml (cord                         wks GA)                                        insert, abd wall)  Face:                  Appears normal         Cord Vessels:           Appears normal (3                         (orbits and profile)                           vessel cord)  Lips:                  Appears normal         Kidneys:                Appear normal  Palate:                Not well visualized    Bladder:                Appears normal  Thoracic:              Appears normal         Spine:                  Appears normal  Heart:  Not well visualized    Upper Extremities:      RT nml; lt NWS  RVOT:                  Appears normal         Lower Extremities:      RT nml; lt NWS  Other:  Technicallly difficult due to advanced GA and maternal habitus. ---------------------------------------------------------------------- Comments  This patient was seen for an ultrasound exam today due to  suspected fetal macrosomia.  The patient reports that her  fundal heights have been measuring larger than her  gestational age.  She has screened negative for diabetes in  her current pregnancy and denies any problems in her  current pregnancy.  She reports that her first child was  delivered at 39+ weeks weighing 8 pounds 4 ounces.  She  had to undergo a vacuum-assisted vaginal delivery as she  was pushing for a prolonged period of time.  She was informed that the fetal growth measures at the 99th  percentile for her gestational age (9 pounds, 4087 g).  There  was normal amniotic fluid noted today.  The views of the fetal anatomy were limited today due to her  advanced gestational age.  Based on ACOG recommendations, as she does not have  gestational diabetes, she may attempt a vaginal delivery if the  EFW measures less than 5000 g.  The increased risk of  shoulder dystocia due to the larger sized fetus was  discussed.  The limitations of ultrasound to predict a shoulder  dystocia should a vaginal delivery be  attempted was also  discussed.  As she already has a prior vaginal delivery and the EFW  obtained today is less than 5000 g, she should be scheduled  for an induction at around 39 weeks (next week).  I would  have a low threshold for a cesarean delivery should there be  any abnormalities in her labor progression or should there be  any prolongation of the second stage of labor.  The patient stated that she was comfortable and happy with  this management plan.  She also stated that all of her  questions had been answered to her complete satisfaction. ----------------------------------------------------------------------                   Johnell Comings, MD Electronically Signed Final Report   10/22/2020 12:47 pm ----------------------------------------------------------------------   Assessment: 30 y.o. [id number] PPD11 TSVD febrile, with significant leukocytosis and elevated lactate  Plan:  1) Leukocytosis and abdominal pain  - recommend CT to further assess etiology - if imaging normal consider straight cath urine specimen - continue gentamycin and clindamycin which would cover endometritis as well as possible pylonephritis - DDx diagnosis includes postpartum endometritis, septic thrombophlebitis cholecystitis, pyelonephritis, and appendicitis  2) BP - mild range BP today.  Does have history of CHTN unmedicated throughout pregnancy.  Also normotensive at time of 1 week postpartum visit in clinic yesterday.  No headaches or vision changes to raise concern for preeclampsia  3) Disposition pending imaging   [name], MD, [place], [place] 11/14/2020, 10:58 PM

## 2020-11-14 NOTE — Sepsis Progress Note (Signed)
Following for sepsis monitoring ?

## 2020-11-14 NOTE — Progress Notes (Signed)
CODE SEPSIS - PHARMACY COMMUNICATION  **Broad Spectrum Antibiotics should be administered within 1 hour of Sepsis diagnosis**  Time Code Sepsis Called/Page Received: 5/25 @ 2206   Antibiotics Ordered: Claudia Byrd   Time of 1st antibiotic administration: Clindamycin 900 mg IV X 1 on 5/25 @ 2206   Additional action taken by pharmacy:   If necessary, Name of Provider/Nurse Contacted:     Ova Meegan D ,PharmD Clinical Pharmacist  11/14/2020  11:23 PM

## 2020-11-14 NOTE — ED Notes (Signed)
First RN notified of lactic 3.4

## 2020-11-15 DIAGNOSIS — O8612 Endometritis following delivery: Secondary | ICD-10-CM

## 2020-11-15 DIAGNOSIS — Z880 Allergy status to penicillin: Secondary | ICD-10-CM | POA: Diagnosis not present

## 2020-11-15 DIAGNOSIS — Z20822 Contact with and (suspected) exposure to covid-19: Secondary | ICD-10-CM | POA: Diagnosis present

## 2020-11-15 DIAGNOSIS — R109 Unspecified abdominal pain: Secondary | ICD-10-CM | POA: Diagnosis present

## 2020-11-15 LAB — LACTIC ACID, PLASMA: Lactic Acid, Venous: 1.7 mmol/L (ref 0.5–1.9)

## 2020-11-15 LAB — CBC
HCT: 27.2 % — ABNORMAL LOW (ref 36.0–46.0)
Hemoglobin: 9.1 g/dL — ABNORMAL LOW (ref 12.0–15.0)
MCH: 27.6 pg (ref 26.0–34.0)
MCHC: 33.5 g/dL (ref 30.0–36.0)
MCV: 82.4 fL (ref 80.0–100.0)
Platelets: 370 10*3/uL (ref 150–400)
RBC: 3.3 MIL/uL — ABNORMAL LOW (ref 3.87–5.11)
RDW: 13.3 % (ref 11.5–15.5)
WBC: 14.5 10*3/uL — ABNORMAL HIGH (ref 4.0–10.5)
nRBC: 0 % (ref 0.0–0.2)

## 2020-11-15 MED ORDER — OXYCODONE-ACETAMINOPHEN 5-325 MG PO TABS: 1.0000 | ORAL_TABLET | ORAL | Status: DC | PRN

## 2020-11-15 MED ORDER — GENTAMICIN SULFATE 40 MG/ML IJ SOLN
500.0000 mg | INTRAVENOUS | Status: DC
  Administered 2020-11-15: 500 mg via INTRAVENOUS
  Filled 2020-11-15 (×2): qty 12.5

## 2020-11-15 MED ORDER — ONDANSETRON HCL 4 MG PO TABS: 4.0000 mg | ORAL_TABLET | Freq: Four times a day (QID) | ORAL | Status: DC | PRN

## 2020-11-15 MED ORDER — GENTAMICIN SULFATE 40 MG/ML IJ SOLN
1.5000 mg/kg | Freq: Three times a day (TID) | INTRAVENOUS | Status: AC
  Administered 2020-11-15: 210 mg via INTRAVENOUS
  Filled 2020-11-15: qty 5.25

## 2020-11-15 MED ORDER — IBUPROFEN 600 MG PO TABS
600.0000 mg | ORAL_TABLET | Freq: Four times a day (QID) | ORAL | Status: DC | PRN
  Administered 2020-11-15: 600 mg via ORAL
  Filled 2020-11-15: qty 1

## 2020-11-15 MED ORDER — ESCITALOPRAM OXALATE 10 MG PO TABS
20.0000 mg | ORAL_TABLET | Freq: Every day | ORAL | Status: DC
  Administered 2020-11-15: 20 mg via ORAL
  Filled 2020-11-15: qty 2

## 2020-11-15 MED ORDER — PRENATAL MULTIVITAMIN CH
1.0000 | ORAL_TABLET | Freq: Every day | ORAL | Status: DC
  Administered 2020-11-15: 1 via ORAL
  Filled 2020-11-15: qty 1

## 2020-11-15 MED ORDER — CLINDAMYCIN PHOSPHATE 900 MG/50ML IV SOLN
900.0000 mg | Freq: Three times a day (TID) | INTRAVENOUS | Status: DC
  Administered 2020-11-15 – 2020-11-16 (×4): 900 mg via INTRAVENOUS
  Filled 2020-11-15 (×6): qty 50

## 2020-11-15 MED ORDER — NORETHINDRONE 0.35 MG PO TABS: 1.0000 | ORAL_TABLET | Freq: Every day | ORAL | Status: DC

## 2020-11-15 MED ORDER — SODIUM CHLORIDE 0.9 % IV SOLN: INTRAVENOUS | Status: DC

## 2020-11-15 MED ORDER — ONDANSETRON HCL 4 MG/2ML IJ SOLN: 4.0000 mg | Freq: Four times a day (QID) | INTRAMUSCULAR | Status: DC | PRN

## 2020-11-15 NOTE — Progress Notes (Signed)
Imaging reviewed with no concern for appendicitis, septic thrombophlebitis, or cholecystitis.  No perinephric stranding or findings concerning for pyelonephritis.   Will admit for continued gentamycin/clindamycin for presumptive postpartum endometritis.

## 2020-11-15 NOTE — Progress Notes (Signed)
Obstetric and Gynecology  Subjective  Doing well, reports feeling better.  Still some mild cramping but improved from admission.  Minimal lochia.  No fevers or chills overnight.  Objective  Vital signs in last 24 hours: Temp:  [97.7 F (36.5 C)-98.4 F (36.9 C)] 97.7 F (36.5 C) (05/26 0350) Pulse Rate:  [66-130] 66 (05/26 0700) Resp:  [11-17] 16 (05/26 0640) BP: (99-141)/(57-100) 99/57 (05/26 0640) SpO2:  [97 %-100 %] 97 % (05/26 0700) Weight:  [139.3 kg] 139.3 kg (05/25 1955)     Intake/Output Summary (Last 24 hours) at 11/15/2020 0851 Last data filed at 11/15/2020 0805 Gross per 24 hour  Intake --  Output 700 ml  Net -700 ml    General: NAD Pulmonary: no increased work of breathing Abdomen: soft, very mild fundal tenderness to deep palpation, no rebound, no guarding Extremities: no edema  Labs: Results for orders placed or performed during the hospital encounter of 11/14/20 (from the past 24 hour(s))  hCG, quantitative, pregnancy     Status: None   Collection Time: 11/14/20  7:37 PM  Result Value Ref Range   hCG, Beta Chain, Quant, S 4 <5 mIU/mL  Comprehensive metabolic panel     Status: Abnormal   Collection Time: 11/14/20  7:57 PM  Result Value Ref Range   Sodium 138 135 - 145 mmol/L   Potassium 3.8 3.5 - 5.1 mmol/L   Chloride 106 98 - 111 mmol/L   CO2 21 (L) 22 - 32 mmol/L   Glucose, Bld 113 (H) 70 - 99 mg/dL   BUN 12 6 - 20 mg/dL   Creatinine, Ser 0.69 0.44 - 1.00 mg/dL   Calcium 8.5 (L) 8.9 - 10.3 mg/dL   Total Protein 6.8 6.5 - 8.1 g/dL   Albumin 3.4 (L) 3.5 - 5.0 g/dL   AST 35 15 - 41 U/L   ALT 31 0 - 44 U/L   Alkaline Phosphatase 146 (H) 38 - 126 U/L   Total Bilirubin 0.6 0.3 - 1.2 mg/dL   GFR, Estimated >60 >60 mL/min   Anion gap 11 5 - 15  CBC with Differential     Status: Abnormal   Collection Time: 11/14/20  7:57 PM  Result Value Ref Range   WBC 24.5 (H) 4.0 - 10.5 K/uL   RBC 3.79 (L) 3.87 - 5.11 MIL/uL   Hemoglobin 10.3 (L) 12.0 - 15.0 g/dL    HCT 31.7 (L) 36.0 - 46.0 %   MCV 83.6 80.0 - 100.0 fL   MCH 27.2 26.0 - 34.0 pg   MCHC 32.5 30.0 - 36.0 g/dL   RDW 13.3 11.5 - 15.5 %   Platelets 447 (H) 150 - 400 K/uL   nRBC 0.0 0.0 - 0.2 %   Neutrophils Relative % 83 %   Neutro Abs 20.1 (H) 1.7 - 7.7 K/uL   Lymphocytes Relative 12 %   Lymphs Abs 3.0 0.7 - 4.0 K/uL   Monocytes Relative 4 %   Monocytes Absolute 1.1 (H) 0.1 - 1.0 K/uL   Eosinophils Relative 0 %   Eosinophils Absolute 0.1 0.0 - 0.5 K/uL   Basophils Relative 0 %   Basophils Absolute 0.1 0.0 - 0.1 K/uL   Immature Granulocytes 1 %   Abs Immature Granulocytes 0.20 (H) 0.00 - 0.07 K/uL  Lactic acid, plasma     Status: Abnormal   Collection Time: 11/14/20  8:10 PM  Result Value Ref Range   Lactic Acid, Venous 3.4 (HH) 0.5 - 1.9 mmol/L  Culture,  blood (Routine x 2)     Status: None (Preliminary result)   Collection Time: 11/14/20  8:10 PM   Specimen: BLOOD  Result Value Ref Range   Specimen Description BLOOD BLOOD RIGHT FOREARM    Special Requests      BOTTLES DRAWN AEROBIC AND ANAEROBIC Blood Culture adequate volume   Culture      NO GROWTH < 12 HOURS Performed at [facility], [address] [place], [place] [address]    Report Status PENDING   Urinalysis, Complete w Microscopic     Status: Abnormal   Collection Time: 11/14/20  8:10 PM  Result Value Ref Range   Color, Urine RED (A) YELLOW   APPearance CLOUDY (A) CLEAR   Specific Gravity, Urine 1.012 1.005 - 1.030   pH  5.0 - 8.0    TEST NOT REPORTED DUE TO COLOR INTERFERENCE OF URINE PIGMENT   Glucose, UA (A) NEGATIVE mg/dL    TEST NOT REPORTED DUE TO COLOR INTERFERENCE OF URINE PIGMENT   Hgb urine dipstick (A) NEGATIVE    TEST NOT REPORTED DUE TO COLOR INTERFERENCE OF URINE PIGMENT   Bilirubin Urine (A) NEGATIVE    TEST NOT REPORTED DUE TO COLOR INTERFERENCE OF URINE PIGMENT   Ketones, ur (A) NEGATIVE mg/dL    TEST NOT REPORTED DUE TO COLOR INTERFERENCE OF URINE PIGMENT   Protein, ur (A)  NEGATIVE mg/dL    TEST NOT REPORTED DUE TO COLOR INTERFERENCE OF URINE PIGMENT   Nitrite (A) NEGATIVE    TEST NOT REPORTED DUE TO COLOR INTERFERENCE OF URINE PIGMENT   Leukocytes,Ua (A) NEGATIVE    TEST NOT REPORTED DUE TO COLOR INTERFERENCE OF URINE PIGMENT   RBC / HPF >50 (H) 0 - 5 RBC/hpf   WBC, UA >50 (H) 0 - 5 WBC/hpf   Bacteria, UA FEW (A) NONE SEEN   Squamous Epithelial / LPF 0-5 0 - 5   WBC Clumps PRESENT    Mucus PRESENT   Lactic acid, plasma     Status: Abnormal   Collection Time: 11/14/20 10:26 PM  Result Value Ref Range   Lactic Acid, Venous 2.4 (HH) 0.5 - 1.9 mmol/L  Culture, blood (Routine x 2)     Status: None (Preliminary result)   Collection Time: 11/14/20 10:26 PM   Specimen: BLOOD  Result Value Ref Range   Specimen Description BLOOD LEFT ANTECUBITAL    Special Requests      BOTTLES DRAWN AEROBIC AND ANAEROBIC Blood Culture adequate volume   Culture      NO GROWTH < 12 HOURS Performed at [facility], [address] [place], [place] [address]    Report Status PENDING   Resp Panel by RT-PCR (Flu A&B, Covid) Nasopharyngeal Swab     Status: None   Collection Time: 11/14/20 10:26 PM   Specimen: Nasopharyngeal Swab; Nasopharyngeal(NP) swabs in vial transport medium  Result Value Ref Range   SARS Coronavirus 2 by RT PCR NEGATIVE NEGATIVE   Influenza A by PCR NEGATIVE NEGATIVE   Influenza B by PCR NEGATIVE NEGATIVE  CBC     Status: Abnormal   Collection Time: 11/15/20  6:10 AM  Result Value Ref Range   WBC 14.5 (H) 4.0 - 10.5 K/uL   RBC 3.30 (L) 3.87 - 5.11 MIL/uL   Hemoglobin 9.1 (L) 12.0 - 15.0 g/dL   HCT 27.2 (L) 36.0 - 46.0 %   MCV 82.4 80.0 - 100.0 fL   MCH 27.6 26.0 - 34.0 pg  MCHC 33.5 30.0 - 36.0 g/dL   RDW 13.3 11.5 - 15.5 %   Platelets 370 150 - 400 K/uL   nRBC 0.0 0.0 - 0.2 %  Lactic acid, plasma     Status: None   Collection Time: 11/15/20  6:10 AM  Result Value Ref Range   Lactic Acid, Venous 1.7 0.5 - 1.9 mmol/L     Cultures: Results for orders placed or performed during the hospital encounter of 11/14/20  Culture, blood (Routine x 2)     Status: None (Preliminary result)   Collection Time: 11/14/20  8:10 PM   Specimen: BLOOD  Result Value Ref Range Status   Specimen Description BLOOD BLOOD RIGHT FOREARM  Final   Special Requests   Final    BOTTLES DRAWN AEROBIC AND ANAEROBIC Blood Culture adequate volume   Culture   Final    NO GROWTH < 12 HOURS Performed at [facility], [address] [place], [place] [address]    Report Status PENDING  Incomplete  Culture, blood (Routine x 2)     Status: None (Preliminary result)   Collection Time: 11/14/20 10:26 PM   Specimen: BLOOD  Result Value Ref Range Status   Specimen Description BLOOD LEFT ANTECUBITAL  Final   Special Requests   Final    BOTTLES DRAWN AEROBIC AND ANAEROBIC Blood Culture adequate volume   Culture   Final    NO GROWTH < 12 HOURS Performed at [facility], [address], [place], [place] [address]    Report Status PENDING  Incomplete  Resp Panel by RT-PCR (Flu A&B, Covid) Nasopharyngeal Swab     Status: None   Collection Time: 11/14/20 10:26 PM   Specimen: Nasopharyngeal Swab; Nasopharyngeal(NP) swabs in vial transport medium  Result Value Ref Range Status   SARS Coronavirus 2 by RT PCR NEGATIVE NEGATIVE Final    Comment: (NOTE) SARS-CoV-2 target nucleic acids are NOT DETECTED.  The SARS-CoV-2 RNA is generally detectable in upper respiratory specimens during the acute phase of infection. The lowest concentration of SARS-CoV-2 viral copies this assay can detect is 138 copies/mL. A negative result does not preclude SARS-Cov-2 infection and should not be used as the sole basis for treatment or other patient management decisions. A negative result may occur with  improper specimen collection/handling, submission of specimen other than nasopharyngeal swab, presence of viral mutation(s) within the areas  targeted by this assay, and inadequate number of viral copies(<138 copies/mL). A negative result must be combined with clinical observations, patient history, and epidemiological information. The expected result is Negative.  Fact Sheet for Patients:  EntrepreneurPulse.com.au  Fact Sheet for Healthcare Providers:  IncredibleEmployment.be  This test is no t yet approved or cleared by the [place] FDA and  has been authorized for detection and/or diagnosis of SARS-CoV-2 by FDA under an Emergency Use Authorization (EUA). This EUA will remain  in effect (meaning this test can be used) for the duration of the COVID-19 declaration under Section 564(b)(1) of the Act, 21 U.S.C.section 360bbb-3(b)(1), unless the authorization is terminated  or revoked sooner.       Influenza A by PCR NEGATIVE NEGATIVE Final   Influenza B by PCR NEGATIVE NEGATIVE Final    Comment: (NOTE) The Xpert Xpress SARS-CoV-2/FLU/RSV plus assay is intended as an aid in the diagnosis of influenza from Nasopharyngeal swab specimens and should not be used as a sole basis for treatment. Nasal washings and aspirates are unacceptable for Xpert Xpress SARS-CoV-2/FLU/RSV testing.  Fact Sheet  for Patients: EntrepreneurPulse.com.au  Fact Sheet for Healthcare Providers: IncredibleEmployment.be  This test is not yet approved or cleared by the [place] FDA and has been authorized for detection and/or diagnosis of SARS-CoV-2 by FDA under an Emergency Use Authorization (EUA). This EUA will remain in effect (meaning this test can be used) for the duration of the COVID-19 declaration under Section 564(b)(1) of the Act, 21 U.S.C. section 360bbb-3(b)(1), unless the authorization is terminated or revoked.  Performed at [facility], [address], [place], [place] [address]     Imaging:  Assessment   30 y.o. [phone] PPD#12 TSVD  with endometritis  Plan    1) Endometritis - lactate normalized - WBC normalizing - subjective improvement per patient with still some very mild fundal tenderness - continue 24-hr course gent/clina with plan for outpatient course of po augmenting  2) FEN - general diet  3) Cardiac monitor - discontinue  4) Disposition - pending clinical improvement

## 2020-11-15 NOTE — ED Notes (Signed)
Pt stated that she is not cramping at the moment at bleeding is more "like a period" at this time.

## 2020-11-15 NOTE — ED Notes (Signed)
Pt in ultrasound

## 2020-11-15 NOTE — ED Notes (Signed)
Patient given water

## 2020-11-15 NOTE — Progress Notes (Signed)
Pharmacy Antibiotic Note  Claudia Byrd is a 30 y.o. female admitted on 11/14/2020 with postpartum endometritis.  Pharmacy has been consulted for Gentamicin dosing.  TBW = 139.3 kg  AdjBW = 95.4 kg   CrCl = 154.9 ml/min   Gentamicin 210 mg IV X 1 given in ED on 5/26 @ 0033.  Plan: Gentamicin 210 mg IV Q8H ordered to continue on 5/26 @ 0800.   Will draw gent peak on 5/26 @ 1800.  Will draw gent trough on 5/27 @ 0030.   Goal gent peak :  4 - 6 mcg/mL  Goal gent trough :  < 1 mcg/mL   Height: 5\' 9"  (175.3 cm) Weight: (!) 139.3 kg (307 lb) IBW/kg (Calculated) : 66.2  Temp (24hrs), Avg:98.4 F (36.9 C), Min:98.4 F (36.9 C), Max:98.4 F (36.9 C)  Recent Labs  Lab 11/14/20 1957 11/14/20 2010 11/14/20 2226  WBC 24.5*  --   --   CREATININE 0.69  --   --   LATICACIDVEN  --  3.4* 2.4*    Estimated Creatinine Clearance: 154.9 mL/min (by C-G formula based on SCr of 0.69 mg/dL).    Allergies  Allergen Reactions  . Penicillins Itching    Did it involve swelling of the face/tongue/throat, SOB, or low BP? No Did it involve sudden or severe rash/hives, skin peeling, or any reaction on the inside of your mouth or nose? No Did you need to seek medical attention at a hospital or doctor's office? No When did it last happen?may 2020 If all above answers are "NO", may proceed with cephalosporin use.   . Sulfa Antibiotics     Unknown, childhood allergy    Antimicrobials this admission:   >>    >>   Dose adjustments this admission:   Microbiology results:  BCx:   UCx:    Sputum:    MRSA PCR:   Thank you for allowing pharmacy to be a part of this patient's care.  Desare Duddy D 11/15/2020 1:53 AM

## 2020-11-15 NOTE — Progress Notes (Addendum)
Pharmacy Antibiotic Note  Claudia Byrd is a 30 y.o. female admitted on 11/14/2020 with postpartum endometritis.  Pharmacy has been consulted for Gentamicin dosing.  TBW = 139.3 kg  AdjBW = 95.4 kg   CrCl = 154.9 ml/min   Gentamicin 210 mg IV X 1 given in ED on 5/26 @ 0033 and also at 0800  Plan:  The accepted practice in this setting is to use extended-interval aminoglycoside dosing,  Change to 500mg  (5mg /kg per Adj BW) IV q24h with 1st dose 8-10h after last dose.    Cancel order for levels as no need for levels if discharged 5/27,  If receives >48h of gentamicin, then recommend checking 6-12h post dose level.   Follow renal function (SCr in am) and cultures   Goal gent trough :  < 0.5 mcg/mL   Height: 5\' 9"  (175.3 cm) Weight: (!) 139.3 kg (307 lb) IBW/kg (Calculated) : 66.2  Temp (24hrs), Avg:98 F (36.7 C), Min:97.7 F (36.5 C), Max:98.4 F (36.9 C)  Recent Labs  Lab 11/14/20 1957 11/14/20 2010 11/14/20 2226 11/15/20 0610  WBC 24.5*  --   --  14.5*  CREATININE 0.69  --   --   --   LATICACIDVEN  --  3.4* 2.4* 1.7    Estimated Creatinine Clearance: 154.9 mL/min (by C-G formula based on SCr of 0.69 mg/dL).    Allergies  Allergen Reactions  . Penicillins Itching    Did it involve swelling of the face/tongue/throat, SOB, or low BP? No Did it involve sudden or severe rash/hives, skin peeling, or any reaction on the inside of your mouth or nose? No Did you need to seek medical attention at a hospital or doctor's office? No When did it last happen?may 2020 If all above answers are "NO", may proceed with cephalosporin use.   . Sulfa Antibiotics     Unknown, childhood allergy    Antimicrobials this admission:  5/25 clinda >>   5/26 gentamicin >>   Dose adjustments this admission:   Microbiology results:  5/25 BCx: NG<12h   5/25 UCx:    Thank you for allowing pharmacy to be a part of this patient's care.  Doreene Eland, PharmD, BCPS.   Work Cell:  [phone] 11/15/2020 10:15 AM

## 2020-11-15 NOTE — Progress Notes (Signed)
Pt admitted to room 347 for observation. Oriented to room and equipment.

## 2020-11-15 NOTE — ED Provider Notes (Signed)
1:05 AM CT and ultrasound concerning for endometriosis.  Lactate is downtrending.  Repeat evaluation patient does not need pressors.  Patient reports doing well otherwise     Vanessa [place], MD 11/15/20 [phone]

## 2020-11-16 DIAGNOSIS — A419 Sepsis, unspecified organism: Secondary | ICD-10-CM

## 2020-11-16 DIAGNOSIS — O8612 Endometritis following delivery: Principal | ICD-10-CM

## 2020-11-16 LAB — URINE CULTURE

## 2020-11-16 LAB — CREATININE, SERUM
Creatinine, Ser: 0.7 mg/dL (ref 0.44–1.00)
GFR, Estimated: >60 mL/min (ref >60)

## 2020-11-16 LAB — CBC
HCT: 29.6 % — ABNORMAL LOW (ref 36.0–46.0)
Hemoglobin: 9.6 g/dL — ABNORMAL LOW (ref 12.0–15.0)
MCH: 26.9 pg (ref 26.0–34.0)
MCHC: 32.4 g/dL (ref 30.0–36.0)
MCV: 82.9 fL (ref 80.0–100.0)
Platelets: 403 10*3/uL — ABNORMAL HIGH (ref 150–400)
RBC: 3.57 MIL/uL — ABNORMAL LOW (ref 3.87–5.11)
RDW: 13.4 % (ref 11.5–15.5)
WBC: 12.6 10*3/uL — ABNORMAL HIGH (ref 4.0–10.5)
nRBC: 0 % (ref 0.0–0.2)

## 2020-11-16 MED ORDER — AMOXICILLIN-POT CLAVULANATE 875-125 MG PO TABS: 1.0000 | ORAL_TABLET | Freq: Two times a day (BID) | ORAL | 0 refills | Status: AC

## 2020-11-16 NOTE — Discharge Instructions (Signed)
[employer] Clinical Advisor 2018 (pp. [phone]). Elsevier.">  Endometritis  Endometritis is an irritation, soreness, or inflammation that affects the lining of the uterus (endometrium). It is often caused by an infection. It is vital to get treatment to prevent complications. Common complications may include more severe infections and not being able to have children(infertility). What are the causes? This condition may be caused by:  Bacterial infections.  STIs (sexually transmitted infections).  A miscarriage or giving birth, especially after a long labor or cesarean delivery.  Certain procedures affecting the female reproductive tract. These may include dilation and curettage (D&C), hysteroscopy, or birth control (contraceptive) insertion.  Tuberculosis (TB). What increases the risk? The following factors may make you more likely to develop this condition:  Having multiple sexual partners.  Not using barrier protection during sex, such as a condom.  Sexual activity before age 63. What are the signs or symptoms? Symptoms of this condition include:  Fever.  Pain in your lower abdomen.  Pain in the area between your hip bones (pelvis).  Vaginal discharge or bleeding that is not normal.  Abdominal bloating or swelling.  General feeling of discomfort or feeling ill.  Discomfort with bowel movements or constipation. How is this diagnosed? This condition may be diagnosed based on:  A physical exam, including a pelvic exam.  Tests, such as: ? Blood tests. ? Taking a sample of endometrial tissue for testing (endometrial biopsy). ? Examining a sample of vaginal discharge under a microscope (wet prep). ? Removing a sample of fluid from the cervix for testing (cervical culture). ? Surgical exam of the pelvis and abdomen. How is this treated? This condition is treated with antibiotic medicines. For more severe cases, fluids and antibiotics may be given through an IV at a  hospital. Follow these instructions at home: Medicines  Take over-the-counter and prescription medicines only as told by your health care provider.  Take your antibiotic medicine as told by your health care provider. Do not stop taking the antibiotic even if you start to feel better. Activity  Do not douche or have sex until your health care provider says it is safe.  If this condition was caused by an STI, do nothave sex until your partner has also been treated for the STI.  Return to your normal activities as told by your health care provider. Ask your health care provider what activities are safe for you. General instructions  Drink enough fluid to keep your urine pale yellow.  Keep all follow-up visits. This is important. Contact a health care provider if:  You have pain that does not get better with medicine.  You have a fever.  You have pain with bowel movements. Get help right away if:  You have swelling in your abdomen.  You have pain in your abdomen that gets worse.  You have a vaginal discharge that smells bad, or you have an increased amount of discharge.  You have vaginal bleeding that is not normal.  You have nausea and vomiting. Summary  Endometritis affects the lining of the uterus (endometrium). It is often caused by an infection.  It is vital to get treatment to prevent complications. Treatment may include antibiotics and IV fluids.  Take your antibiotic medicine as told by your health care provider. Do not stop taking the antibiotic even if you start to feel better.  Do not douche or have sex until your health care provider says it is safe. This information is not intended to replace advice given  to you by your health care provider. Make sure you discuss any questions you have with your health care provider. Document Revised: 01/11/2020 Document Reviewed: 01/11/2020 Elsevier Patient Education  2021 [employer].   Postpartum Sepsis Sepsis is a  severe reaction to an infection. Postpartum sepsis may develop any time within 6 weeks of giving birth, but it usually occurs within a few days of giving birth. This condition is also called puerperal sepsis. Pregnancy causes changes in your body's disease-fighting system (immune system) that increase your risk for infection and sepsis. Postpartum sepsis is a medical emergency. It is a serious, life-threatening condition that needs to be treated right away. It can cause inflammation, leaking in the blood vessels, and decreased blood flow to important organs, including your lungs, liver, and kidneys. If sepsis is not diagnosed and treated quickly, it can lead to septic shock. Shock can result in dangerously low blood pressure and organ failure. What are the causes? This condition is caused by a severe immune system reaction to an infection. A type of bacteria called group A streptococcus is the most common cause of infection that leads to postpartum sepsis. Other types of bacteria can also lead to postpartum sepsis. Infections that lead to postpartum sepsis can be directly related to birth or can develop during the postpartum period. Common examples of infections that can cause sepsis include:  Infection of a wound or surgical incision.  An accumulation of pus (abscess).  Infection of the uterus (endometritis).  Urinary tract infection.  Pneumonia.  Abdominal infections. What increases the risk? You are more likely to develop this condition if you had any of these risk factors during pregnancy:  You had your baby through surgery (cesarean delivery).  You had an infection during labor or within 6 weeks of having your baby. You may also be at higher risk if you have any of these conditions during or after giving birth:  Congestive heart failure.  Long-term liver disease.  Long-term kidney disease.  An active infection.  Lupus.  Diabetes.  Obesity. What are the signs or  symptoms? Early symptoms of this condition may depend on the area that is infected. Common symptoms include:  Fever.  A fast heart rate.  Fast breathing.  An infection. As the infection progresses to sepsis, these symptoms may develop:  Patches of discolored skin, or mottling of the skin.  Shivering or feeling very cold.  Feeling very weak.  Confusion.  Difficulty breathing.  Passing very little urine. How is this diagnosed? This condition may be diagnosed based on:  Your symptoms, physical exam, and medical history, including any risk factors of postpartum sepsis.  Tests to find the source of infection and its effect on your body. These may include: ? Cultures of blood, wound drainage, urine, or vaginal discharge. ? Blood tests to check for infection and liver or kidney damage. ? Imaging studies, such as X-ray, ultrasound, CT scan, or MRI. These are used to check for infection, tissue damage, or an abscess. How is this treated? This condition is treated in the hospital as a medical emergency. The goal of treatment is to control the infection, support breathing and blood pressure, and prevent septic shock. Treatment may include:  Fluids given through an IV.  Antibiotic medicines given through an IV.  Surgery to drain an abscess or remove damaged tissue. If the condition becomes septic shock, you may be treated with:  Breathing support. This may include being given oxygen or placing a tube into the  trachea (endotracheal tube).  IV medicine to support blood pressure if it falls too low.  Kidney dialysis. This process filters the blood if your kidneys fail. Follow these instructions at home: Medicines  Take over-the-counter and prescription medicines only as told by your health care provider.  If you were prescribed an antibiotic medicine, take it as told by your health care provider. Do not stop taking the antibiotic even if you start to feel  better. Activity  Rest as told by your health care provider.  Return to your normal activities as told by your health care provider. Ask your health care provider what activities are safe for you. General instructions  Drink enough fluid to keep your urine pale yellow.  Eat a healthy, balanced diet. This includes plenty of fruits and vegetables, whole grains, low-fat (lean) proteins, and low-fat dairy. Ask your health care provider if you should avoid certain foods.  Keep all follow-up visits as told by your health care provider. This is important.      Contact a health care provider if you have:  Chills or fever.  A breathing rate of more than 24 breaths in a minute at rest.  A heart rate of more than 120 beats in a minute at rest.  Frequent or painful urination.  New or foul-smelling vaginal discharge.  Any area that becomes red, swollen, or painful.  Any other symptoms of infection. Get help right away if:  You have a high fever or shivering.  You have severe pain.  Your skin becomes pale, clammy, or discolored.  You feel confused or very sleepy.  You have chest pain or difficulty breathing. These symptoms may represent a serious problem that is an emergency. Do not wait to see if the symptoms will go away. Get medical help right away. Call your local emergency services (911 in the [place] Do not drive yourself to the hospital. Summary  Postpartum sepsis is a severe bodily reaction to an infection that occurs within 6 weeks after giving birth.  Pregnancy causes changes in your immune system that increase your risk for infection and sepsis.  Postpartum sepsis is a very serious condition that is treated in the hospital as a medical emergency.  If you experience signs or symptoms of an infection during the postpartum period, notify your health care provider immediately. This information is not intended to replace advice given to you by your health care provider. Make  sure you discuss any questions you have with your health care provider. Document Revised: 07/07/2019 Document Reviewed: 07/07/2019 Elsevier Patient Education  2021 [employer].

## 2020-11-16 NOTE — Progress Notes (Signed)
Pt discharged home. Discharge instructions, prescriptions, and follow up appointments given to and reviewed with pt. Pt verbalized understanding. Escorted by axillary.

## 2020-11-16 NOTE — Discharge Summary (Addendum)
Discharge Summary  Patient ID: YAEKO FAZEKAS MRN: [id number] DOB/AGE: [birth year] 30 y.o.  Admit date: 11/14/2020 Discharge date: 11/16/2020  Admission Diagnoses:  Postpartum endometritis Sepsis  Discharge Diagnoses:  Active Problems:   Postpartum endometritis   Discharged Condition: good  Hospital Course: Patient was admitted on PPD#31 with abdominal pain and fevers. She was noted to have significant leukocytosis and elevated lactate. Her imaging was negative for appendicitis, septic thrombophlebitis, or cholecystitis. She was admitted and treated presumptively for postpartum endometritis with a 24 hour course of gent and clinda. During her stay she remained afebrile. Her WBC continued to trend downward the the day of discharge. On the day of discharge the patient reported an over improvement in her symptoms of abdominal pain and denied any further cramping. She reports that her vaginal bleeding has tapered down significantly and is less than menses.  Consults: None  Significant Diagnostic Studies:  Labs - CBC, lactate acid - review results flowsheet  EXAM: PORTABLE CHEST 1 VIEW COMPARISON:  None. FINDINGS: Low lung volumes. Upper normal heart size likely accentuated by technique. No focal airspace disease. No large pleural effusion. No pulmonary edema. No pneumothorax. No acute osseous abnormalities are seen. IMPRESSION: Low lung volumes. No evidence of pneumonia or pulmonary edema.   EXAM: CT ABDOMEN AND PELVIS WITH CONTRAST  TECHNIQUE: Multidetector CT imaging of the abdomen and pelvis was performed using the standard protocol following bolus administration of intravenous contrast.  CONTRAST:  117mL OMNIPAQUE IOHEXOL 300 MG/ML  SOLN  COMPARISON:  None.  FINDINGS: Lower chest: No acute abnormality. Hepatobiliary: No focal liver abnormality. No gallstones, gallbladder wall thickening, or pericholecystic fluid. No biliary dilatation. Pancreas: No focal lesion.  Normal pancreatic contour. No surrounding inflammatory changes. No main pancreatic ductal dilatation. Spleen: Normal in size without focal abnormality. Adrenals/Urinary Tract: No adrenal nodule bilaterally. Bilateral kidneys enhance symmetrically. No hydronephrosis. No hydroureter. The urinary bladder is unremarkable. Stomach/Bowel: Stomach is within normal limits. No evidence of bowel wall thickening or dilatation. Appendix appears normal. Vascular/Lymphatic: No abdominal aorta or iliac aneurysm. No abdominal, pelvic, or inguinal lymphadenopathy. Reproductive: Thickened heterogeneous endometrium measuring up to at least 28 cm. Bilateral ovaries are unremarkable. Other: No intraperitoneal free fluid. No intraperitoneal free gas. No organized fluid collection. Musculoskeletal: No acute or significant osseous findings. IMPRESSION: Thickened heterogeneous endometrium. Concern for retained products of conception. Recommend pelvic ultrasound.  EXAM: TRANSABDOMINAL ULTRASOUND OF PELVIS TECHNIQUE: Transabdominal ultrasound examination of the pelvis was performed including evaluation of the uterus, ovaries, adnexal regions, and pelvic cul-de-sac. COMPARISON:  None. FINDINGS: Uterus Measurements: 14.4 x 7.6 x 10.0 cm = volume: 572.7 mL. Uterus is anteverted and somewhat enlarged related to recent pregnancy. No discrete fibroid or other mass. Endometrium Thickness: 20 mm. Complex hypoechoic fluid seen within the endometrial cavity. Increased vascularity within the adjacent myometrium and possibly endometrium. Right ovary Measurements: 4.0 x 3.3 x 4.2 cm = volume: 29.2 mL. Normal appearance/no adnexal mass. Left ovary Measurements: 4.1 x 1.9 x 3.8 cm = volume: 15.7 mL. Normal appearance/no adnexal mass. Other findings:  No abnormal free fluid. IMPRESSION: 1. Complex hypoechoic fluid within the endometrial cavity, suspected to reflect blood products given history of bleeding.  Increased vascularity within the adjacent myometrium and possibly endometrium. Clinical correlation for possible endometritis recommended. 2. Otherwise unremarkable and normal pelvic ultrasound for age.    Treatments: IV hydration and antibiotics: gentamycin and clinda  Discharge Exam: Blood pressure 111/72, pulse 87, temperature 98.3 F (36.8 C), temperature source Oral, resp. rate 16, height 5'  9" (1.753 m), weight (!) 139.3 kg, SpO2 100 %, unknown if currently breastfeeding. General appearance: alert, cooperative and no distress Resp: clear to auscultation bilaterally and normal respiratory effort Cardio: regular rate and rhythm, S1, S2 normal, no murmur, click, rub or gallop GI: soft, non-tender; bowel sounds normal; no masses,  no organomegaly Extremities: extremities normal, atraumatic, no cyanosis or edema Pulses: 2+ and symmetric Skin: Skin color, texture, turgor normal. No rashes or lesions Neurologic: Grossly normal  Disposition: Discharge home Discharge disposition: 01-Home or Self Care  Discharge Instructions    Activity as tolerated   Complete by: As directed    Call MD for:  difficulty breathing, headache or visual disturbances   Complete by: As directed    Call MD for:  extreme fatigue   Complete by: As directed    Call MD for:  hives   Complete by: As directed    Call MD for:  persistant dizziness or light-headedness   Complete by: As directed    Call MD for:  persistant nausea and vomiting   Complete by: As directed    Call MD for:  redness, tenderness, or signs of infection (pain, swelling, redness, odor or green/yellow discharge around incision site)   Complete by: As directed    Call MD for:  severe uncontrolled pain   Complete by: As directed    Call MD for:  temperature >100.4   Complete by: As directed    Diet general   Complete by: As directed      Allergies as of 11/16/2020      Reactions   Penicillins Itching   Did it involve swelling of the  face/tongue/throat, SOB, or low BP? No Did it involve sudden or severe rash/hives, skin peeling, or any reaction on the inside of your mouth or nose? No Did you need to seek medical attention at a hospital or doctor's office? No When did it last happen?may 2020 If all above answers are "NO", may proceed with cephalosporin use.   Sulfa Antibiotics    Unknown, childhood allergy      Medication List    STOP taking these medications   aspirin EC 81 MG tablet     TAKE these medications   amoxicillin-clavulanate 875-125 MG tablet Commonly known as: Augmentin Take 1 tablet by mouth 2 (two) times daily for 10 days.   escitalopram 20 MG tablet Commonly known as: LEXAPRO Take 1 tablet (20 mg total) by mouth daily.   ibuprofen 600 MG tablet Commonly known as: ADVIL Take 1 tablet (600 mg total) by mouth every 6 (six) hours as needed.   loratadine 10 MG tablet Commonly known as: CLARITIN Take 10 mg by mouth daily.   norethindrone 0.35 MG tablet Commonly known as: Camila Take 1 tablet (0.35 mg total) by mouth daily.   omeprazole 20 MG tablet Commonly known as: PRILOSEC OTC Take 20 mg by mouth daily as needed (acid reflux).   prenatal multivitamin Tabs tablet Take 1 tablet by mouth daily at 12 noon.   VITAMIN D PO Take by mouth.      A total of 20 minutes were spent face-to-face with the patient as well as preparation, review, communication, and documentation during this encounter.    SignedOrlie Pollen 11/16/2020, 9:41 AM

## 2020-11-19 LAB — CULTURE, BLOOD (ROUTINE X 2)
Culture: NO GROWTH
Culture: NO GROWTH
Special Requests: ADEQUATE
Special Requests: ADEQUATE

## 2020-11-27 ENCOUNTER — Other Ambulatory Visit: Payer: Self-pay | Admitting: Obstetrics and Gynecology

## 2020-11-27 MED ORDER — BUSPIRONE HCL 7.5 MG PO TABS: 7.5000 mg | ORAL_TABLET | Freq: Two times a day (BID) | ORAL | 2 refills | Status: DC

## 2020-12-10 NOTE — H&P (Signed)
Obstetrics & Gynecology Consult H&P    Consulting Department: Emergency Department   Consulting Physician: Arta Silence, MD   Consulting Question: Febrile, abdominal pain   History of Present Illness: Patient is a 30 y.o. [id number] WCB#76 uncomplicated vaginal delivery who presents with increased abdominal pain and fevers.  She reports she noted chills today which prompted her to check her temperature at home.  She has noted some slight increase in vaginal bleeding but not significant.  No nausea, vomiting, loose stools, URI symptoms.  Not currently breastfeeding and no breast complaints concerning for possible endometritis.  Pain is described at 6/10, midline, and described as cramping.     Her delivery was an elective induction at [redacted]w[redacted]d.  She did not have a prolonged labor.  She was only ruptured for approximately 8-hrs.  GBS was negative and the patient did not receive an intrapartum or postpartum antibiotics.   H&H went from 11.5 & 33.7 on 11/02/2020 to 9.2 & 27.2 on PPD1 11/03/2020 and is stable today at 10.3 & 31.7.   WBC was 11.7 on 11/02/2020 and went to 14.8 on PPD1 11/03/2020 is now up to 24.5.  Lactate is 3.4.   Review of Systems:10 point review of systems negative unless noted in HPI   Past Medical History:       Patient Active Problem List    Diagnosis Date Noted   SVD (spontaneous vaginal delivery)     Encounter for induction of labor 11/02/2020   Maternal obesity, antepartum 03/28/2020   Family history of colon cancer     Supervision of high risk pregnancy, antepartum 03/10/2018      Clinic [facility] Prenatal Labs  Dating EDD by LMP c/w 9w u/s Blood type: O/Positive/-- (09/18 1153)  Genetic Screen NIPS: Normal XY Inheritest: SMA, CF, Fragile-X negative 01/03/2018 Antibody:Negative (09/18 1153)  Anatomic [place] Complete 06/25/20 Rubella: 5.81 (09/18 1153) Varicella: Immune  GTT Early: Hgb1C 5.3, 1-hr 131 Third trimester: 145 3-hr 86, 155, 146, 110 RPR: Non Reactive (09/18  1153)  Rhogam N/A HBsAg: Negative (09/18 1153)  TDaP vaccine Flu Shot Covid  09/26/20  05/04/21 Pfeizer (booster as well) HIV: Non Reactive (09/18 1153)  Baby Food  Breast                               GBS:  Contraception  POP Pap: 03/10/18 NILM  CBB no Pelvis tested to 8lbs 4oz (VAVD)  CS/VBAC N/A  [ placed 09/26/20] anesthesia referral  Support Person Husband [name]              Chronic hypertension during pregnancy, antepartum 03/10/2018      [ ]  Aspirin 81 mg daily after 12 weeks; discontinue after 36 weeks [X]  baseline labs with CBC, CMP, urine protein/creatinine ratio [ ]  no BP meds unless BPs become elevated [ ]  ultrasound for growth at 28, 32, 36 weeks       Current antihypertensives:  None    Baseline and surveillance labs (pulled in from [facility], refresh links as needed)       Lab Results  Component Value Date    PLT 379 03/10/2018    CREATININE 0.70 03/10/2018    AST 18 03/10/2018    ALT 10 03/10/2018      Antenatal Testing CHTN - O10.919  Group I  BP < 140/90, no preeclampsia, AGA,  nml AFV, +/- meds    Group II BP > 140/90, on meds,  no preeclampsia, AGA, nml AFV   20-28-34-38   20-24-28-32-35-38   32//2 x wk   28//BPP wkly then 32//2 x wk   40 no meds; 39 meds   PRN or 37  Pre-eclampsia  GHTN - O13.9/Preeclampsia without severe features  - O14.00    Preeclampsia with severe features - O14.10   Q 3-4wks   Q 2 wks   28//BPP wkly then 32//2 x wk   Inpatient   37   PRN or 34        Obesity affecting pregnancy 03/10/2018      Antenatal Testing [ ]  BMI 35-39.9           Weekly at 37 weeks [x ] BMI 40 or >Weekly at 34 weeks          Past Surgical History:       Past Surgical History:  Procedure Laterality Date   COLONOSCOPY WITH PROPOFOL N/A 05/27/2019    Procedure: COLONOSCOPY WITH PROPOFOL;  Surgeon: Lucilla Lame, MD;  Location: [facility] ENDOSCOPY;  Service: Endoscopy;  Laterality: N/A;   DIAGNOSTIC LAPAROSCOPY       LAPAROSCOPIC OVARIAN  CYSTECTOMY Right 02/24/2019    Procedure: LAPAROSCOPIC RIGHT OVARIAN CYSTECTOMY;  Surgeon: [name], MD;  Location: [facility] ORS;  Service: Gynecology;  Laterality: Right;   TONSILLECTOMY          Gynecologic History:   Obstetric History: [id number]   Family History:       Family History  Problem Relation Age of Onset   Breast cancer Other 71   Breast cancer Maternal Uncle 82      Social History:  Social History         Socioeconomic History   Marital status: Married      Spouse name: Not on file   Number of children: Not on file   Years of education: Not on file   Highest education level: Not on file  Occupational History   Not on file  Tobacco Use   Smoking status: Never Smoker   Smokeless tobacco: Never Used  Vaping Use   Vaping Use: Never used  Substance and Sexual Activity   Alcohol use: Never   Drug use: Never   Sexual activity: Yes      Partners: Male      Birth control/protection: Pill  Other Topics Concern   Not on file  Social History Narrative   Not on file    Social Determinants of Health    Financial Resource Strain: Not on file  Food Insecurity: Not on file  Transportation Needs: Not on file  Physical Activity: Not on file  Stress: Not on file  Social Connections: Not on file  Intimate Partner Violence: Not on file      Allergies:       Allergies  Allergen Reactions   Penicillins Itching      Did it involve swelling of the face/tongue/throat, SOB, or low BP? No Did it involve sudden or severe rash/hives, skin peeling, or any reaction on the inside of your mouth or nose? No Did you need to seek medical attention at a hospital or doctor's office? No When did it last happen?     may 2020 If all above answers are "NO", may proceed with cephalosporin use.     Sulfa Antibiotics        Unknown, childhood allergy      Medications:        Prior to Admission medications  Medication Sig Start  Date End Date Taking? Authorizing Provider   aspirin EC 81 MG tablet Take 81 mg by mouth daily. Swallow whole.       [provider]  escitalopram (LEXAPRO) 20 MG tablet Take 1 tablet (20 mg total) by mouth daily. 08/13/20     [name], MD  ibuprofen (ADVIL) 600 MG tablet Take 1 tablet (600 mg total) by mouth every 6 (six) hours as needed. 11/04/20     Schuman, Stefanie Libel, MD  loratadine (CLARITIN) 10 MG tablet Take 10 mg by mouth daily.       [provider]  norethindrone (CAMILA) 0.35 MG tablet Take 1 tablet (0.35 mg total) by mouth daily. 11/04/20     Schuman, Stefanie Libel, MD  omeprazole (PRILOSEC OTC) 20 MG tablet Take 20 mg by mouth daily as needed (acid reflux).       [provider]  Prenatal Vit-Fe Fumarate-FA (PRENATAL MULTIVITAMIN) TABS tablet Take 1 tablet by mouth daily at 12 noon.       [provider]  VITAMIN D PO Take by mouth.       [provider]      Physical Exam Vitals: Blood pressure (!) 141/100, pulse (!) 130, temperature 98.4 F (36.9 C), temperature source Oral, resp. rate 16, height 5\' 9"  (1.753 m), weight (!) 139.3 kg, SpO2 98 %, not currently breastfeeding.   General: NAD and non-ill appearing HEENT: normocephalic, anicteric Pulmonary: No increased work of breathing Cardiovascular: RRR, distal pulses 2+ Abdomen: soft, non-distended, no rebound, no guarding, fundus firm 4cm below umbilicus, mild fundal tenderness.  Also some mild RUQ pain.   Genitourinary: deferred Extremities: no edema, erythema, or tenderness Neurologic: Grossly intact Psychiatric: mood appropriate, affect full   Labs: Lab Results Past 72 Hours        Results for orders placed or performed during the hospital encounter of 11/14/20 (from the past 72 hour(s))  Comprehensive metabolic panel     Status: Abnormal    Collection Time: 11/14/20  7:57 PM  Result Value Ref Range    Sodium 138 135 - 145 mmol/L    Potassium 3.8 3.5 - 5.1 mmol/L    Chloride 106 98 - 111 mmol/L    CO2 21  (L) 22 - 32 mmol/L    Glucose, Bld 113 (H) 70 - 99 mg/dL      Comment: Glucose reference range applies only to samples taken after fasting for at least 8 hours.    BUN 12 6 - 20 mg/dL    Creatinine, Ser 0.69 0.44 - 1.00 mg/dL    Calcium 8.5 (L) 8.9 - 10.3 mg/dL    Total Protein 6.8 6.5 - 8.1 g/dL    Albumin 3.4 (L) 3.5 - 5.0 g/dL    AST 35 15 - 41 U/L    ALT 31 0 - 44 U/L    Alkaline Phosphatase 146 (H) 38 - 126 U/L    Total Bilirubin 0.6 0.3 - 1.2 mg/dL    GFR, Estimated >60 >60 mL/min      Comment: (NOTE) Calculated using the CKD-EPI Creatinine Equation (2021)      Anion gap 11 5 - 15      Comment: Performed at [facility], [address] [place],  [address]  CBC with Differential     Status: Abnormal    Collection Time: 11/14/20  7:57 PM  Result Value Ref Range    WBC 24.5 (H) 4.0 - 10.5 K/uL    RBC 3.79 (  L) 3.87 - 5.11 MIL/uL    Hemoglobin 10.3 (L) 12.0 - 15.0 g/dL    HCT 31.7 (L) 36.0 - 46.0 %    MCV 83.6 80.0 - 100.0 fL    MCH 27.2 26.0 - 34.0 pg    MCHC 32.5 30.0 - 36.0 g/dL    RDW 13.3 11.5 - 15.5 %    Platelets 447 (H) 150 - 400 K/uL    nRBC 0.0 0.0 - 0.2 %    Neutrophils Relative % 83 %    Neutro Abs 20.1 (H) 1.7 - 7.7 K/uL    Lymphocytes Relative 12 %    Lymphs Abs 3.0 0.7 - 4.0 K/uL    Monocytes Relative 4 %    Monocytes Absolute 1.1 (H) 0.1 - 1.0 K/uL    Eosinophils Relative 0 %    Eosinophils Absolute 0.1 0.0 - 0.5 K/uL    Basophils Relative 0 %    Basophils Absolute 0.1 0.0 - 0.1 K/uL    Immature Granulocytes 1 %    Abs Immature Granulocytes 0.20 (H) 0.00 - 0.07 K/uL      Comment: Performed at [facility], [address] [place], [place] [address]  Lactic acid, plasma     Status: Abnormal    Collection Time: 11/14/20  8:10 PM  Result Value Ref Range    Lactic Acid, Venous 3.4 (HH) 0.5 - 1.9 mmol/L      Comment: CRITICAL RESULT CALLED TO, READ BACK BY AND VERIFIED WITH ALEXANDRA OLIVER AT 2127 ON 11/14/20 BY SS Performed at  [facility], [address] [place], [place] [address]    Urinalysis, Complete w Microscopic     Status: Abnormal    Collection Time: 11/14/20  8:10 PM  Result Value Ref Range    Color, Urine RED (A) YELLOW    APPearance CLOUDY (A) CLEAR    Specific Gravity, Urine 1.012 1.005 - 1.030    pH   5.0 - 8.0      TEST NOT REPORTED DUE TO COLOR INTERFERENCE OF URINE PIGMENT    Glucose, UA (A) NEGATIVE mg/dL      TEST NOT REPORTED DUE TO COLOR INTERFERENCE OF URINE PIGMENT    Hgb urine dipstick (A) NEGATIVE      TEST NOT REPORTED DUE TO COLOR INTERFERENCE OF URINE PIGMENT    Bilirubin Urine (A) NEGATIVE      TEST NOT REPORTED DUE TO COLOR INTERFERENCE OF URINE PIGMENT    Ketones, ur (A) NEGATIVE mg/dL      TEST NOT REPORTED DUE TO COLOR INTERFERENCE OF URINE PIGMENT    Protein, ur (A) NEGATIVE mg/dL      TEST NOT REPORTED DUE TO COLOR INTERFERENCE OF URINE PIGMENT    Nitrite (A) NEGATIVE      TEST NOT REPORTED DUE TO COLOR INTERFERENCE OF URINE PIGMENT    Leukocytes,Ua (A) NEGATIVE      TEST NOT REPORTED DUE TO COLOR INTERFERENCE OF URINE PIGMENT    RBC / HPF >50 (H) 0 - 5 RBC/hpf    WBC, UA >50 (H) 0 - 5 WBC/hpf    Bacteria, UA FEW (A) NONE SEEN    Squamous Epithelial / LPF 0-5 0 - 5    WBC Clumps PRESENT      Mucus PRESENT        Comment: Performed at [facility], [address], [place], [place] [address]        Imaging  Imaging Results  DG Chest  Port 1 View   Result Date: 11/14/2020 CLINICAL DATA:  Questionable sepsis - evaluate for abnormality Fever.  Recent postpartum. EXAM: PORTABLE CHEST 1 VIEW COMPARISON:  None. FINDINGS: Low lung volumes. Upper normal heart size likely accentuated by technique. No focal airspace disease. No large pleural effusion. No pulmonary edema. No pneumothorax. No acute osseous abnormalities are seen. IMPRESSION: Low lung volumes. No evidence of pneumonia or pulmonary edema. Electronically Signed   By: Keith Rake M.D.   On:  11/14/2020 22:19   [place] MFM OB DETAIL +14 WK   Result Date: 10/22/2020 ----------------------------------------------------------------------  OBSTETRICS REPORT                       (Signed Final 10/22/2020 12:47 pm) ---------------------------------------------------------------------- Patient Info  ID #:       [id number]                          D.O.B.:  1991/06/02 (29 yrs)  Name:       Claudia Byrd                   Visit Date: 10/22/2020 09:34 am ---------------------------------------------------------------------- Performed By  Attending:        Johnell Comings MD         Referred By:      Jola Babinski [facility]  Performed By:     Hubert Azure          [facility]                                                             [facility] ---------------------------------------------------------------------- Orders  #  Description                           Code        Ordered By  1  [place] MFM OB DETAIL +14 [place]               [address].25    Prentice Docker ----------------------------------------------------------------------  #  Order #                     Accession #                Episode #  1  [id number]                   [id number]  [id number] ---------------------------------------------------------------------- Indications  Obesity complicating pregnancy, third          O99.213  trimester (pre preg BMI 39.9)  Macrosomia                                     O36.60X0  Encounter for antenatal screening for          Z36.3  malformations  [redacted] weeks gestation of pregnancy                Z3A.37  Hypertension - Chronic/Pre-existing            O10.019 ---------------------------------------------------------------------- Fetal Evaluation  Num Of Fetuses:         1  Fetal Heart Rate(bpm):  152  Cardiac Activity:       Observed  Presentation:           Cephalic   Placenta:               Posterior  P. Cord Insertion:      Visualized, central  Amniotic Fluid  AFI FV:      Within normal limits  AFI Sum(cm)     %Tile       Largest Pocket(cm)  17.29           67          5.87  RUQ(cm)       RLQ(cm)       LUQ(cm)        LLQ(cm)  5.02          4.51          5.87           1.89 ---------------------------------------------------------------------- Biometry  BPD:     100.7  mm     G. Age:  41w 3d       > 99  %    CI:        77.73   %    70 - 86                                                          FL/HC:      18.8   %    20.9 - 22.7  HC:      361.5  mm     G. Age:  42w 5d       > 99  %    HC/AC:      0.96        0.92 - 1.05  AC:      375.4  mm     G. Age:  41w 3d       > 99  %    FL/BPD:     67.6   %    71 - 87  FL:       68.1  mm     G. Age:  35w 0d        3.4  %    FL/AC:      18.1   %    20 - 24  HUM:      62.5  mm     G. Age:  36w 2d         48  %  LV:          7  mm  Est. FW:    4087  gm           9 lb     99  % ---------------------------------------------------------------------- OB History  Gravidity:    2         Term:   1 ---------------------------------------------------------------------- Gestational Age  LMP:           37w 5d        Date:  02/01/20                 EDD:   11/07/20  U/S Today:     40w 1d                                        EDD:   10/21/20  Best:          37w 5d     Det. By:  LMP  (02/01/20)          EDD:   11/07/20 ---------------------------------------------------------------------- Anatomy  Cranium:               Appears normal         LVOT:                   Appears normal  Cavum:                 Appears normal         Aortic Arch:            Not well visualized  Ventricles:            Appears normal         Ductal Arch:            Not well visualized  Choroid Plexus:        Appears normal         Diaphragm:              Appears normal  Cerebellum:            Appears normal         Stomach:                Appears normal, left                                                                         sided  Posterior Fossa:       Appears normal         Abdomen:                Appears normal  Nuchal Fold:           Not applicable (>65    Abdominal Wall:         Appears nml (cord                         wks GA)  insert, abd wall)  Face:                  Appears normal         Cord Vessels:           Appears normal (3                         (orbits and profile)                           vessel cord)  Lips:                  Appears normal         Kidneys:                Appear normal  Palate:                Not well visualized    Bladder:                Appears normal  Thoracic:              Appears normal         Spine:                  Appears normal  Heart:                 Not well visualized    Upper Extremities:      RT nml; lt NWS  RVOT:                  Appears normal         Lower Extremities:      RT nml; lt NWS  Other:  Technicallly difficult due to advanced GA and maternal habitus. ---------------------------------------------------------------------- Comments  This patient was seen for an ultrasound exam today due to  suspected fetal macrosomia.  The patient reports that her  fundal heights have been measuring larger than her  gestational age.  She has screened negative for diabetes in  her current pregnancy and denies any problems in her  current pregnancy.  She reports that her first child was  delivered at 39+ weeks weighing 8 pounds 4 ounces.  She  had to undergo a vacuum-assisted vaginal delivery as she  was pushing for a prolonged period of time.  She was informed that the fetal growth measures at the 99th  percentile for her gestational age (9 pounds, 4087 g).  There  was normal amniotic fluid noted today.  The views of the fetal anatomy were limited today due to her  advanced gestational age.  Based on ACOG recommendations, as she does not have  gestational diabetes, she may attempt a vaginal delivery if  the  EFW measures less than 5000 g.  The increased risk of  shoulder dystocia due to the larger sized fetus was  discussed.  The limitations of ultrasound to predict a shoulder  dystocia should a vaginal delivery be attempted was also  discussed.  As she already has a prior vaginal delivery and the EFW  obtained today is less than 5000 g, she should be scheduled  for an induction at around 39 weeks (next week).  I would  have a low threshold for a cesarean delivery should there be  any abnormalities in her labor progression or should there be  any prolongation of the second stage  of labor.  The patient stated that she was comfortable and happy with  this management plan.  She also stated that all of her  questions had been answered to her complete satisfaction. ----------------------------------------------------------------------                   Johnell Comings, MD Electronically Signed Final Report   10/22/2020 12:47 pm ----------------------------------------------------------------------      Assessment: 30 y.o. [id number] PPD11 TSVD febrile, with significant leukocytosis and elevated lactate   Plan:   1) Leukocytosis and abdominal pain - recommend CT to further assess etiology - if imaging normal consider straight cath urine specimen - continue gentamycin and clindamycin which would cover endometritis as well as possible pylonephritis - DDx diagnosis includes postpartum endometritis, septic thrombophlebitis cholecystitis, pyelonephritis, and appendicitis   2) BP - mild range BP today.  Does have history of CHTN unmedicated throughout pregnancy.  Also normotensive at time of 1 week postpartum visit in clinic yesterday.  No headaches or vision changes to raise concern for preeclampsia   3) Disposition pending imaging    [name], MD, [place], [place] 11/14/2020, 10:58 PM

## 2020-12-17 ENCOUNTER — Other Ambulatory Visit (HOSPITAL_COMMUNITY)
Admission: RE | Admit: 2020-12-17 | Discharge: 2020-12-17 | Disposition: A | Discharge status: Home / Self Care | Payer: BC Managed Care – PPO | Source: Ambulatory Visit | Attending: Obstetrics and Gynecology | Admitting: Obstetrics and Gynecology

## 2020-12-17 ENCOUNTER — Encounter: Payer: Self-pay | Admitting: Obstetrics and Gynecology

## 2020-12-17 ENCOUNTER — Other Ambulatory Visit: Payer: Self-pay

## 2020-12-17 ENCOUNTER — Ambulatory Visit (INDEPENDENT_AMBULATORY_CARE_PROVIDER_SITE_OTHER): Payer: BC Managed Care – PPO | Admitting: Obstetrics and Gynecology

## 2020-12-17 VITALS — BP 122/78 | Ht 69.0 in | Wt 317.0 lb

## 2020-12-17 DIAGNOSIS — Z124 Encounter for screening for malignant neoplasm of cervix: Secondary | ICD-10-CM | POA: Diagnosis present

## 2020-12-17 DIAGNOSIS — F419 Anxiety disorder, unspecified: Secondary | ICD-10-CM

## 2020-12-17 DIAGNOSIS — F32A Depression, unspecified: Secondary | ICD-10-CM

## 2020-12-17 DIAGNOSIS — Z800 Family history of malignant neoplasm of digestive organs: Secondary | ICD-10-CM

## 2020-12-17 MED ORDER — NORETHIN ACE-ETH ESTRAD-FE 1-20 MG-MCG(24) PO TABS: 1.0000 | ORAL_TABLET | Freq: Every day | ORAL | 3 refills | Status: DC

## 2020-12-17 MED ORDER — ESCITALOPRAM OXALATE 20 MG PO TABS: 20.0000 mg | ORAL_TABLET | Freq: Every day | ORAL | 3 refills | Status: AC

## 2020-12-17 NOTE — Progress Notes (Signed)
Postpartum Visit  Chief Complaint:  Chief Complaint  Patient presents with   Post-op Follow-up    6 wk postpartum - no concerns. RM 6    History of Present Illness: Patient is a 30 y.o. [id number] presents for postpartum visit.  Date of delivery: 11/03/2020 Type of delivery: Vaginal delivery - Vacuum or forceps assisted  no Episiotomy No.  Laceration: no  Pregnancy or labor problems:  no Any problems since the delivery:  yes postpartum endometritis  Newborn Details:  SINGLETON :  1. BabyGender female. Birth weight:   Maternal Details:  Breast or formula feeding: plans to bottle feed Intercourse: No  Contraception after delivery: Yes  POP Any bowel or bladder issues: No  Post partum depression/anxiety noted:  no Edinburgh Post-Partum Depression Score: 0 Date of last PAP: 9/18/2019NIL and HR HPV negative   Review of Systems: Review of Systems  Constitutional: Negative.   Gastrointestinal: Negative.   Genitourinary: Negative.   Psychiatric/Behavioral: Negative.     The following portions of the patient's history were reviewed and updated as appropriate: allergies, current medications, past family history, past medical history, past social history, past surgical history, and problem list.  Past Medical History:  Past Medical History:  Diagnosis Date   Family history of breast cancer    7/21 genetic testing letter sent   GERD (gastroesophageal reflux disease)    Hypertension    no longer hypertensive    Past Surgical History:  Past Surgical History:  Procedure Laterality Date   COLONOSCOPY WITH PROPOFOL N/A 05/27/2019   Procedure: COLONOSCOPY WITH PROPOFOL;  Surgeon: Lucilla Lame, MD;  Location: [facility] ENDOSCOPY;  Service: Endoscopy;  Laterality: N/A;   DIAGNOSTIC LAPAROSCOPY     LAPAROSCOPIC OVARIAN CYSTECTOMY Right 02/24/2019   Procedure: LAPAROSCOPIC RIGHT OVARIAN CYSTECTOMY;  Surgeon: [name], MD;  Location: [facility] ORS;  Service: Gynecology;  Laterality:  Right;   TONSILLECTOMY      Family History:  Family History  Problem Relation Age of Onset   Breast cancer Other 46   Breast cancer Maternal Uncle 33    Social History:  Social History   Socioeconomic History   Marital status: Married    Spouse name: Not on file   Number of children: Not on file   Years of education: Not on file   Highest education level: Not on file  Occupational History   Not on file  Tobacco Use   Smoking status: Never   Smokeless tobacco: Never  Vaping Use   Vaping Use: Never used  Substance and Sexual Activity   Alcohol use: Never   Drug use: Never   Sexual activity: Yes    Partners: Male    Birth control/protection: Pill  Other Topics Concern   Not on file  Social History Narrative   Not on file   Social Determinants of Health   Financial Resource Strain: Not on file  Food Insecurity: Not on file  Transportation Needs: Not on file  Physical Activity: Not on file  Stress: Not on file  Social Connections: Not on file  Intimate Partner Violence: Not on file    Allergies:  Allergies  Allergen Reactions   Penicillins Itching    Did it involve swelling of the face/tongue/throat, SOB, or low BP? No Did it involve sudden or severe rash/hives, skin peeling, or any reaction on the inside of your mouth or nose? No Did you need to seek medical attention at a hospital or doctor's office? No When did it  last happen?     may 2020 If all above answers are "NO", may proceed with cephalosporin use.    Sulfa Antibiotics     Unknown, childhood allergy    Medications: Prior to Admission medications   Medication Sig Start Date End Date Taking? Authorizing Provider  Norethindrone Acetate-Ethinyl Estrad-FE (LOESTRIN 24 FE) 1-20 MG-MCG(24) tablet Take 1 tablet by mouth daily. 12/17/20  Yes [name], MD  busPIRone (BUSPAR) 7.5 MG tablet Take 1 tablet (7.5 mg total) by mouth 2 (two) times daily. 11/27/20   [name], MD  escitalopram  (LEXAPRO) 20 MG tablet Take 1 tablet (20 mg total) by mouth daily. 12/17/20   [name], MD  ibuprofen (ADVIL) 600 MG tablet Take 1 tablet (600 mg total) by mouth every 6 (six) hours as needed. 11/04/20   Schuman, Stefanie Libel, MD  loratadine (CLARITIN) 10 MG tablet Take 10 mg by mouth daily.    [provider]  omeprazole (PRILOSEC OTC) 20 MG tablet Take 20 mg by mouth daily as needed (acid reflux).    [provider]  Prenatal Vit-Fe Fumarate-FA (PRENATAL MULTIVITAMIN) TABS tablet Take 1 tablet by mouth daily at 12 noon.    [provider]  VITAMIN D PO Take by mouth.    [provider]    Physical Exam Blood pressure 122/78, height 5' 9"  (1.753 m), weight (!) 317 lb (143.8 kg), unknown if currently breastfeeding.    General: NAD HEENT: normocephalic, anicteric Pulmonary: No increased work of breathing Abdomen: NABS, soft, non-tender, non-distended.  Umbilicus without lesions.  No hepatomegaly, splenomegaly or masses palpable. No evidence of hernia. Genitourinary:  External: Normal external female genitalia.  Normal urethral meatus, normal  Bartholin's and Skene's glands.    Vagina: Normal vaginal mucosa, no evidence of prolapse.    Cervix: Grossly normal in appearance, no bleeding  Uterus: Non-enlarged, mobile, normal contour.  No CMT  Adnexa: ovaries non-enlarged, no adnexal masses  Rectal: deferred Extremities: no edema, erythema, or tenderness Neurologic: Grossly intact Psychiatric: mood appropriate, affect full   Edinburgh Postnatal Depression Scale - 12/17/20 0933       Edinburgh Postnatal Depression Scale:  In the Past 7 Days   I have been able to laugh and see the funny side of things. 0    I have looked forward with enjoyment to things. 0    I have blamed myself unnecessarily when things went wrong. 0    I have been anxious or worried for no good reason. 0    I have felt scared or panicky for no good reason. 0    Things have  been getting on top of me. 0    I have been so unhappy that I have had difficulty sleeping. 0    I have felt sad or miserable. 0    I have been so unhappy that I have been crying. 0    The thought of harming myself has occurred to me. 0    Edinburgh Postnatal Depression Scale Total 0             Assessment: 30 y.o. T6O0600 presenting for 6 week postpartum visit  Plan: Problem List Items Addressed This Visit       Other   Family history of colon cancer   Relevant Orders   Integrated BRACAnalysis ([place])   Other Visit Diagnoses     Screening for malignant neoplasm of cervix    -  Primary   Relevant Orders  Cytology - PAP   6 weeks postpartum follow-up       Anxiety and depression       Relevant Medications   escitalopram (LEXAPRO) 20 MG tablet        1) Contraception - Education given regarding options for contraception, as well as compatibility with breast feeding if applicable.  Patient plans on OCP (estrogen/progesterone) for contraception.  2)  Pap - ASCCP guidelines and rational discussed.  ASCCP guidelines and rational discussed.  Patient opts for every 3 years screening interval  3) Patient underwent screening for postpartum depression with no signs of depression - continue lexapro at 85m   4) Return in about 1 year (around 12/17/2021) for annual.   AMalachy Mood MD, [facility] 12/17/2020, 10:30 AM

## 2020-12-19 LAB — CYTOLOGY - PAP
Adequacy: ABSENT
Comment: NEGATIVE
Diagnosis: NEGATIVE
High risk HPV: NEGATIVE

## 2021-02-05 ENCOUNTER — Other Ambulatory Visit: Payer: Self-pay | Admitting: Obstetrics and Gynecology

## 2021-02-05 MED ORDER — NORGESTIMATE-ETH ESTRADIOL 0.25-35 MG-MCG PO TABS: 1.0000 | ORAL_TABLET | Freq: Every day | ORAL | 3 refills | Status: AC

## 2021-03-16 ENCOUNTER — Other Ambulatory Visit: Payer: Self-pay | Admitting: Obstetrics and Gynecology

## 2021-05-10 ENCOUNTER — Encounter: Payer: Self-pay | Admitting: Obstetrics and Gynecology

## 2021-05-12 ENCOUNTER — Other Ambulatory Visit: Payer: Self-pay | Admitting: Obstetrics and Gynecology

## 2021-05-12 DIAGNOSIS — N939 Abnormal uterine and vaginal bleeding, unspecified: Secondary | ICD-10-CM

## 2021-05-21 ENCOUNTER — Ambulatory Visit: Payer: BC Managed Care – PPO

## 2021-05-27 ENCOUNTER — Ambulatory Visit
Admission: RE | Admit: 2021-05-27 | Discharge: 2021-05-27 | Disposition: A | Discharge status: Home / Self Care | Payer: BC Managed Care – PPO | Source: Ambulatory Visit | Attending: Obstetrics and Gynecology | Admitting: Obstetrics and Gynecology

## 2021-05-27 ENCOUNTER — Other Ambulatory Visit: Payer: Self-pay

## 2021-05-27 DIAGNOSIS — N939 Abnormal uterine and vaginal bleeding, unspecified: Secondary | ICD-10-CM | POA: Diagnosis present

## 2021-05-29 ENCOUNTER — Encounter: Payer: Self-pay | Admitting: Obstetrics and Gynecology

## 2021-05-29 ENCOUNTER — Ambulatory Visit: Payer: BC Managed Care – PPO | Admitting: Obstetrics and Gynecology

## 2021-05-29 ENCOUNTER — Other Ambulatory Visit: Payer: Self-pay

## 2021-05-29 ENCOUNTER — Ambulatory Visit (INDEPENDENT_AMBULATORY_CARE_PROVIDER_SITE_OTHER): Payer: BC Managed Care – PPO | Admitting: Obstetrics and Gynecology

## 2021-05-29 VITALS — BP 118/68 | Ht 69.0 in | Wt 267.0 lb

## 2021-05-29 DIAGNOSIS — Z8639 Personal history of other endocrine, nutritional and metabolic disease: Secondary | ICD-10-CM | POA: Diagnosis not present

## 2021-05-29 DIAGNOSIS — N939 Abnormal uterine and vaginal bleeding, unspecified: Secondary | ICD-10-CM

## 2021-05-29 DIAGNOSIS — Z1329 Encounter for screening for other suspected endocrine disorder: Secondary | ICD-10-CM | POA: Diagnosis not present

## 2021-05-29 DIAGNOSIS — R634 Abnormal weight loss: Secondary | ICD-10-CM | POA: Diagnosis not present

## 2021-05-31 LAB — DHEA-SULFATE: DHEA-SO4: 127 ug/dL (ref 84.8–378.0)

## 2021-05-31 LAB — TESTOSTERONE,FREE AND TOTAL
Testosterone, Free: 0.3 pg/mL (ref 0.0–4.2)
Testosterone: 9 ng/dL (ref 8–60)

## 2021-05-31 LAB — PROLACTIN: Prolactin: 21.2 ng/mL (ref 4.8–23.3)

## 2021-05-31 LAB — BETA HCG QUANT (REF LAB): hCG Quant: <1 m[IU]/mL

## 2021-05-31 LAB — FSH/LH
FSH: 6.7 m[IU]/mL
LH: 9.4 m[IU]/mL

## 2021-05-31 LAB — TSH: TSH: 0.786 u[IU]/mL (ref 0.450–4.500)

## 2021-06-04 NOTE — Progress Notes (Signed)
Gynecology Ultrasound Follow Up  Chief Complaint:  Chief Complaint  Patient presents with   Follow-up     History of Present Illness: Patient is a 30 y.o. female who presents today for ultrasound evaluation of .  Ultrasound demonstrates the following findgins Adnexa: normal left ovary, right ovary not clearly visualized secondary to overlying bowl  Uterus: Non-enlarged with endometrial stripe  just over 75mm, no focal lesions Additional: no free fluid  Review of Systems: Review of Systems  Constitutional: Negative.   Gastrointestinal: Negative.   Genitourinary: Negative.    Past Medical History:  Past Medical History:  Diagnosis Date   Family history of breast cancer    7/21 genetic testing letter sent   GERD (gastroesophageal reflux disease)    Hypertension    no longer hypertensive    Past Surgical History:  Past Surgical History:  Procedure Laterality Date   COLONOSCOPY WITH PROPOFOL N/A 05/27/2019   Procedure: COLONOSCOPY WITH PROPOFOL;  Surgeon: Lucilla Lame, MD;  Location: [facility] ENDOSCOPY;  Service: Endoscopy;  Laterality: N/A;   DIAGNOSTIC LAPAROSCOPY     LAPAROSCOPIC OVARIAN CYSTECTOMY Right 02/24/2019   Procedure: LAPAROSCOPIC RIGHT OVARIAN CYSTECTOMY;  Surgeon: [name], MD;  Location: [facility] ORS;  Service: Gynecology;  Laterality: Right;   TONSILLECTOMY      Gynecologic History:  Patient's last menstrual period was 05/14/2021.  Family History:  Family History  Problem Relation Age of Onset   Breast cancer Other 60   Breast cancer Maternal Uncle 78    Social History:  Social History   Socioeconomic History   Marital status: Married    Spouse name: Not on file   Number of children: Not on file   Years of education: Not on file   Highest education level: Not on file  Occupational History   Not on file  Tobacco Use   Smoking status: Never   Smokeless tobacco: Never  Vaping Use   Vaping Use: Never used  Substance and Sexual Activity    Alcohol use: Never   Drug use: Never   Sexual activity: Yes    Partners: Male    Birth control/protection: Pill  Other Topics Concern   Not on file  Social History Narrative   Not on file   Social Determinants of Health   Financial Resource Strain: Not on file  Food Insecurity: Not on file  Transportation Needs: Not on file  Physical Activity: Not on file  Stress: Not on file  Social Connections: Not on file  Intimate Partner Violence: Not on file    Allergies:  Allergies  Allergen Reactions   Penicillins Itching    Did it involve swelling of the face/tongue/throat, SOB, or low BP? No Did it involve sudden or severe rash/hives, skin peeling, or any reaction on the inside of your mouth or nose? No Did you need to seek medical attention at a hospital or doctor's office? No When did it last happen?     may 2020 If all above answers are "NO", may proceed with cephalosporin use.    Sulfa Antibiotics     Unknown, childhood allergy    Medications: Prior to Admission medications   Medication Sig Start Date End Date Taking? Authorizing Provider  escitalopram (LEXAPRO) 20 MG tablet Take 1 tablet (20 mg total) by mouth daily. 12/17/20  Yes [name], MD  loratadine (CLARITIN) 10 MG tablet Take 10 mg by mouth daily.   Yes [provider]  norgestimate-ethinyl estradiol (ORTHO-CYCLEN) 0.25-35 MG-MCG tablet Take  1 tablet by mouth daily. 02/05/21  Yes [name], MD  ibuprofen (ADVIL) 600 MG tablet Take 1 tablet (600 mg total) by mouth every 6 (six) hours as needed. 11/04/20   Schuman, Stefanie Libel, MD  omeprazole (PRILOSEC OTC) 20 MG tablet Take 20 mg by mouth daily as needed (acid reflux).    [provider]  Prenatal Vit-Fe Fumarate-FA (PRENATAL MULTIVITAMIN) TABS tablet Take 1 tablet by mouth daily at 12 noon. Patient not taking: Reported on 05/29/2021    [provider]  VITAMIN D PO Take by mouth. Patient not taking: Reported on 05/29/2021     [provider]    Physical Exam Vitals: Blood pressure 118/68, height 5\' 9"  (1.753 m), weight 267 lb (121.1 kg), last menstrual period 05/14/2021, not currently breastfeeding.  General: NAD HEENT: normocephalic, anicteric Pulmonary: No increased work of breathing Extremities: no edema, erythema, or tenderness Neurologic: Grossly intact, normal gait Psychiatric: mood appropriate, affect full  US PELVIC COMPLETE WITH TRANSVAGINAL  Result Date: 05/28/2021 CLINICAL DATA:  Abnormal uterine bleeding. EXAM: TRANSABDOMINAL AND TRANSVAGINAL ULTRASOUND OF PELVIS TECHNIQUE: Both transabdominal and transvaginal ultrasound examinations of the pelvis were performed. Transabdominal technique was performed for global imaging of the pelvis including uterus, ovaries, adnexal regions, and pelvic cul-de-sac. It was necessary to proceed with endovaginal exam following the transabdominal exam to visualize the uterus, endometrium, bilateral ovaries and bilateral adnexa. COMPARISON:  Nov 14, 2020 FINDINGS: Uterus Measurements: 9.3 cm x 3.7 cm x 5.8 cm = volume: 104.6 mL. No fibroids or other mass visualized. Endometrium Thickness: 5.86 mm.  No focal abnormality visualized. Right ovary The right ovary is not visualized secondary to overlying bowel gas. Left ovary Measurements: 4.2 cm x 2.0 cm x 3.0 cm = volume: 12.7 mL. Normal appearance/no adnexal mass. Other findings No abnormal free fluid. IMPRESSION: 1. Limited evaluation of the right ovary secondary to overlying bowel gas. 2. Otherwise, unremarkable pelvic ultrasound. Electronically Signed   By: Virgina Norfolk M.D.   On: 05/28/2021 01:38     Assessment: 30 y.o. G2P2002 ultrasound follow up AUB   Plan: Problem List Items Addressed This Visit   None Visit Diagnoses     Abnormal uterine bleeding    -  Primary   Relevant Orders   DHEA-sulfate (Completed)   FSH/LH (Completed)   Testosterone,Free and Total (Completed)   TSH (Completed)   Prolactin  (Completed)   Beta hCG quant (ref lab) (Completed)   History of thyroiditis       Relevant Orders   TSH (Completed)   Thyroid disorder screening       Relevant Orders   TSH (Completed)   Weight loss       Relevant Orders   TSH (Completed)       1) AUB - normal TVUS without evidence of structural abnormalities.  Will obtain PCOS panel.  Patient is interested in Loaza IUD for management of AUB.  Call with next cycle to schedule  2) A total of 15 minutes were spent in face-to-face contact with the patient during this encounter with over half of that time devoted to counseling and coordination of care.  3) Return call with next menstrual cycle Mirena IUD.    [name], MD, [facility], [facility] 06/04/2021, 7:43 PM

## 2021-06-10 ENCOUNTER — Encounter: Payer: Self-pay | Admitting: Obstetrics and Gynecology

## 2021-06-10 ENCOUNTER — Telehealth: Payer: Self-pay

## 2021-06-10 NOTE — Telephone Encounter (Signed)
Noted. Mirena and paragard reserved for this patient.

## 2021-06-10 NOTE — Telephone Encounter (Signed)
Patient is scheduled for Thursday, 12/22 for paraguard vs mirena placement with ABC

## 2021-06-13 ENCOUNTER — Encounter: Payer: Self-pay | Admitting: Obstetrics and Gynecology

## 2021-06-13 ENCOUNTER — Other Ambulatory Visit: Payer: Self-pay

## 2021-06-13 ENCOUNTER — Ambulatory Visit (INDEPENDENT_AMBULATORY_CARE_PROVIDER_SITE_OTHER): Payer: BC Managed Care – PPO | Admitting: Obstetrics and Gynecology

## 2021-06-13 VITALS — BP 140/90 | Ht 69.0 in | Wt 271.0 lb

## 2021-06-13 DIAGNOSIS — Z30430 Encounter for insertion of intrauterine contraceptive device: Secondary | ICD-10-CM

## 2021-06-13 MED ORDER — LEVONORGESTREL 20 MCG/DAY IU IUD: 1.0000 | INTRAUTERINE_SYSTEM | Freq: Once | INTRAUTERINE | 0 refills | Status: AC

## 2021-06-13 NOTE — Telephone Encounter (Signed)
Mirena rcvd/charged 06/13/2021

## 2021-06-13 NOTE — Progress Notes (Signed)
° °  Chief Complaint  Patient presents with   Contraception    Mirena insertion     IUD PROCEDURE NOTE:  Claudia Byrd is a 30 y.o. [phone] here for Mirena  IUD insertion for AUB per Dr. Georgianne Fick.    BP 140/90    Ht 5\' 9"  (1.753 m)    Wt 271 lb (122.9 kg)    LMP 06/09/2021 (Approximate)    Breastfeeding No    BMI 40.02 kg/m   IUD Insertion Procedure Note Patient identified, informed consent performed, consent signed.   Discussed risks of irregular bleeding, cramping, infection, malpositioning or misplacement of the IUD outside the uterus which may require further procedure such as laparoscopy, risk of failure <1%. Time out was performed.    Speculum placed in the vagina.  Cervix visualized.  Cleaned with Betadine x 2.  Grasped anteriorly with a single tooth tenaculum.  Uterus sounded to 9.0 cm.   IUD placed per manufacturer's recommendations.  Strings trimmed to 3 cm. Tenaculum was removed, good hemostasis noted.  Patient tolerated procedure well.   ASSESSMENT:  Encounter for IUD insertion - Plan: levonorgestrel (MIRENA) 20 MCG/DAY IUD   Meds ordered this encounter  Medications   levonorgestrel (MIRENA) 20 MCG/DAY IUD    Sig: 1 each by Intrauterine route once for 1 dose.    Dispense:  1 each    Refill:  0    Order Specific Question:   Supervising Provider    Answer:   [name] [[id number]]     Plan:  Patient was given post-procedure instructions.  She was advised to have backup contraception for one week.   Call if you are having increasing pain, cramps or bleeding or if you have a fever greater than 100.4 degrees F., shaking chills, nausea or vomiting. Patient was also asked to check IUD strings periodically and follow up in 4 weeks for IUD check.  Return in about 4 weeks (around 07/11/2021) for IUD f/u.  Spenser Cong B. Bedelia Pong, PA-C 06/13/2021 10:43 AM

## 2021-06-13 NOTE — Patient Instructions (Signed)
I value your feedback and you entrusting us with your care. If you get a [facility] patient survey, I would appreciate you taking the time to let us know about your experience today. Thank you!  [facility] [phone]  Instructions after IUD insertion  Most women experience no significant problems after insertion of an IUD, however minor cramping and spotting for a few days is common. Cramps may be treated with ibuprofen 800mg every 8 hours or Tylenol 650 mg every 4 hours. Contact [facility] immediately if you experience any of the following symptoms during the next week: temperature >99.6 degrees, worsening pelvic pain, abdominal pain, fainting, unusually heavy vaginal bleeding, foul vaginal discharge, or if you think you have expelled the IUD.  Nothing inserted in the vagina for 48 hours. You will be scheduled for a follow up visit in approximately four weeks.  You should check monthly to be sure you can feel the IUD strings in the upper vagina. If you are having a monthly period, try to check after each period. If you cannot feel the IUD strings,  contact [facility] immediately so we can do an exam to determine if the IUD has been expelled.   Please use backup protection until we can confirm the IUD is in place.  Call [facility] if you are exposed to or diagnosed with a sexually transmitted infection, as we will need to discuss whether it is safe for you to continue using an IUD.   

## 2021-06-16 ENCOUNTER — Encounter: Payer: Self-pay | Admitting: Obstetrics and Gynecology

## 2021-06-18 ENCOUNTER — Encounter: Payer: Self-pay | Admitting: Obstetrics and Gynecology

## 2021-07-05 ENCOUNTER — Other Ambulatory Visit: Payer: Self-pay | Admitting: Obstetrics and Gynecology

## 2021-07-15 ENCOUNTER — Ambulatory Visit: Payer: BC Managed Care – PPO | Admitting: Obstetrics and Gynecology

## 2021-08-12 ENCOUNTER — Other Ambulatory Visit: Payer: Self-pay

## 2021-08-12 ENCOUNTER — Encounter: Payer: Self-pay | Admitting: Obstetrics and Gynecology

## 2021-08-12 ENCOUNTER — Ambulatory Visit: Payer: BC Managed Care – PPO | Admitting: Obstetrics and Gynecology

## 2021-08-12 VITALS — BP 140/80 | Ht 69.0 in | Wt 254.0 lb

## 2021-08-12 DIAGNOSIS — Z30431 Encounter for routine checking of intrauterine contraceptive device: Secondary | ICD-10-CM

## 2021-08-12 NOTE — Progress Notes (Signed)
° °  Chief Complaint  Patient presents with   IUD check    No concerns     History of Present Illness:  Claudia Byrd is a 31 y.o. that had a Mirena IUD placed approximately 2 months ago for AUB/BC. Since that time, she denies dyspareunia, pelvic pain, vaginal d/c, heavy bleeding. Had occas spotting initially with mild dysmen, but none recently. Doing well.   Review of Systems  Constitutional:  Negative for fever.  Gastrointestinal:  Negative for blood in stool, constipation, diarrhea, nausea and vomiting.  Genitourinary:  Negative for dyspareunia, dysuria, flank pain, frequency, hematuria, urgency, vaginal bleeding, vaginal discharge and vaginal pain.  Musculoskeletal:  Negative for back pain.  Skin:  Negative for rash.   Physical Exam:  BP 140/80    Ht 5\' 9"  (1.753 m)    Wt 254 lb (115.2 kg)    Breastfeeding No    BMI 37.51 kg/m  Body mass index is 37.51 kg/m.  Pelvic exam:  Two IUD strings present seen coming from the cervical os. EGBUS, vaginal vault and cervix: within normal limits   Assessment:   Encounter for routine checking of intrauterine contraceptive device (IUD)  IUD strings present in proper location; pt doing well  Plan: F/u if any signs of infection or can no longer feel the strings.   Tolulope Pinkett B. Akim Watkinson, PA-C 08/12/2021 10:03 AM

## 2021-11-03 IMAGING — CT CT ABD-PELV W/ CM
2 of 4 series · 16 of 46 positions shown, 18 images · IV contrast (APPLIED)
Comparison: None.

CLINICAL DATA: Abdominal pain/infection suspected. Fever 101.8.
Cramping and vaginal bleeding. gave birth with second child on [DATE]
with healthy pregnancy.

EXAM:
CT ABDOMEN AND PELVIS WITH CONTRAST
TECHNIQUE: Multidetector CT imaging of the abdomen and pelvis was performed
using the standard protocol following bolus administration of
intravenous contrast.
CONTRAST:  125mL OMNIPAQUE IOHEXOL 300 MG/ML  SOLN

[Series 2: routine abd/pel with · axial · 0.98mm/px · z∈[-1183,-648]mm · 13 of 117 slices shown, 15 images]
[im 5/117  soft-tissue]
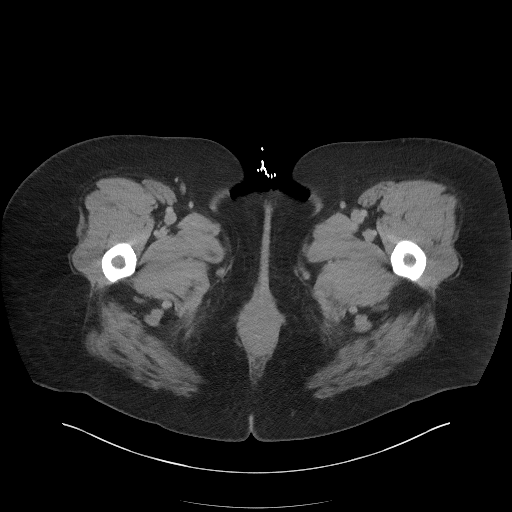
[im 5/117  bone]
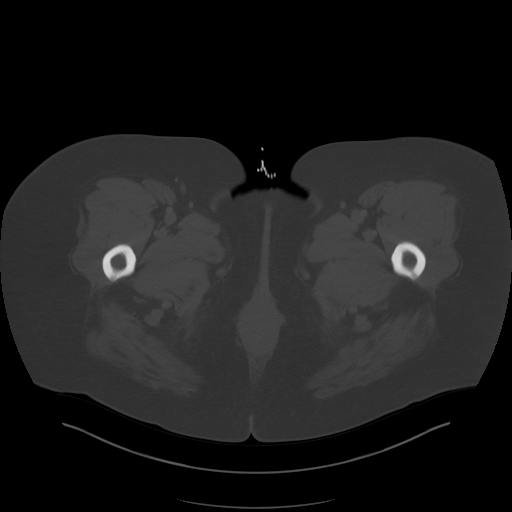
[im 15/117  soft-tissue]
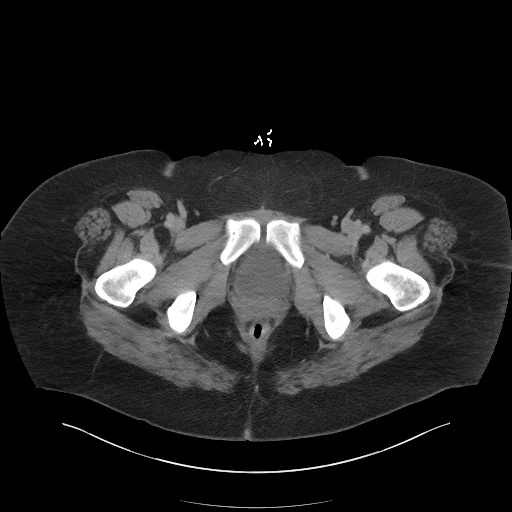
[im 25/117  soft-tissue]
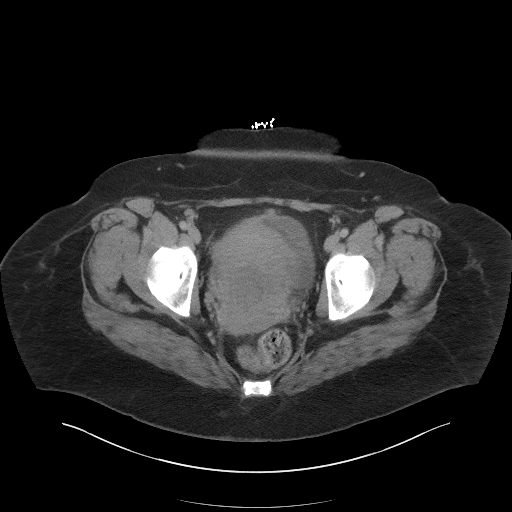
[im 34/117  soft-tissue]
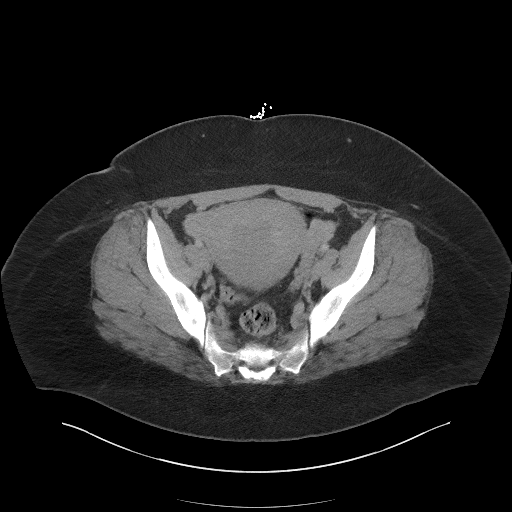
[im 39/117  soft-tissue]
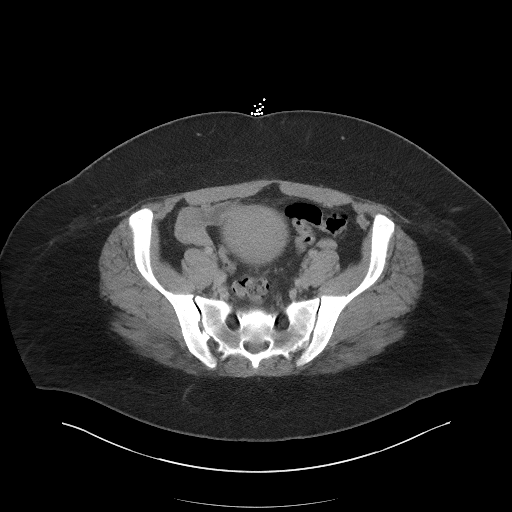
[im 49/117  soft-tissue]
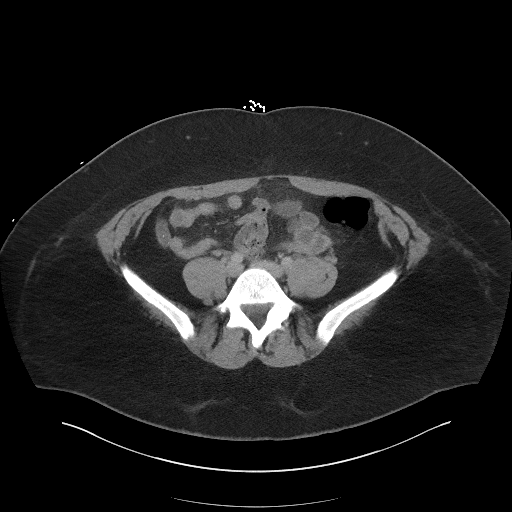
[im 59/117  soft-tissue]
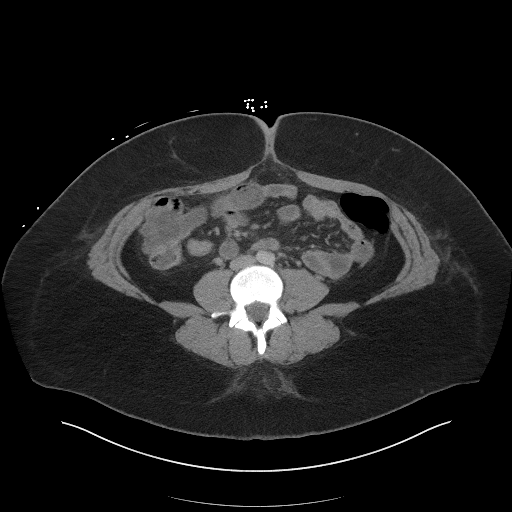
[im 68/117  soft-tissue]
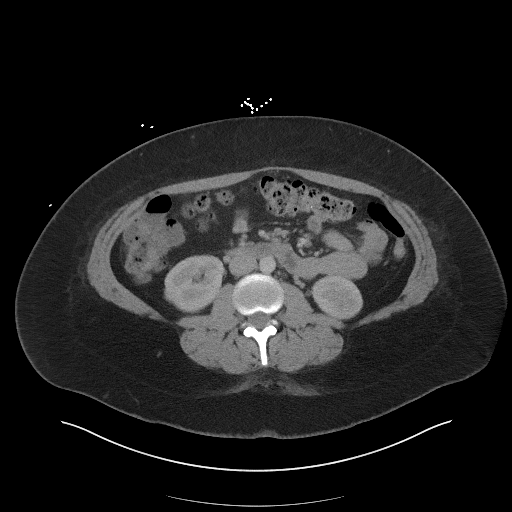
[im 78/117  soft-tissue]
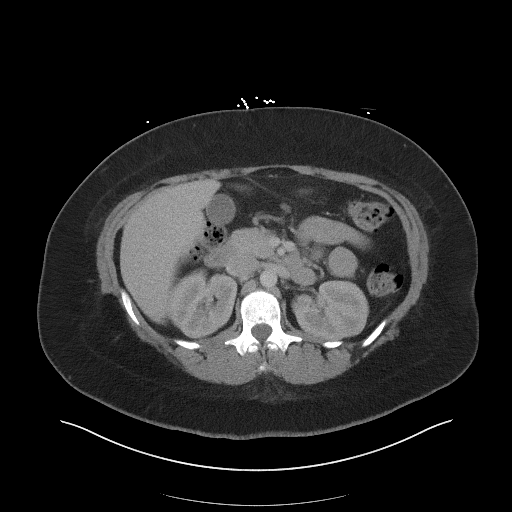
[im 78/117  bone]
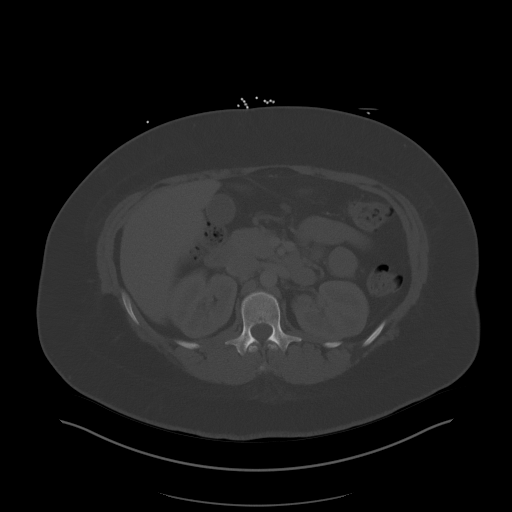
[im 83/117  soft-tissue]
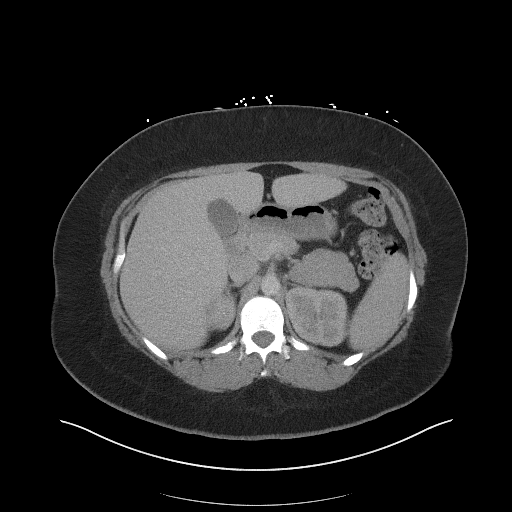
[im 92/117  soft-tissue]
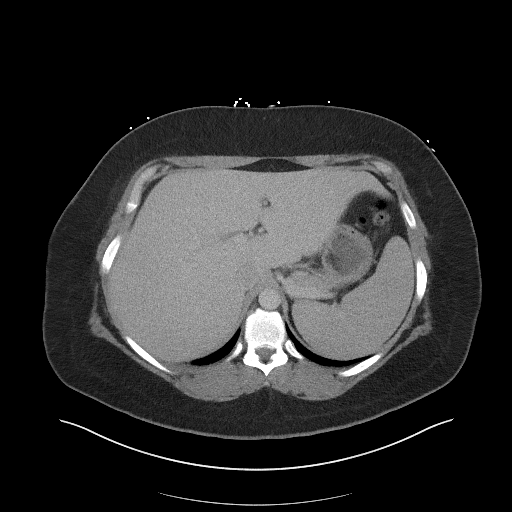
[im 102/117  soft-tissue]
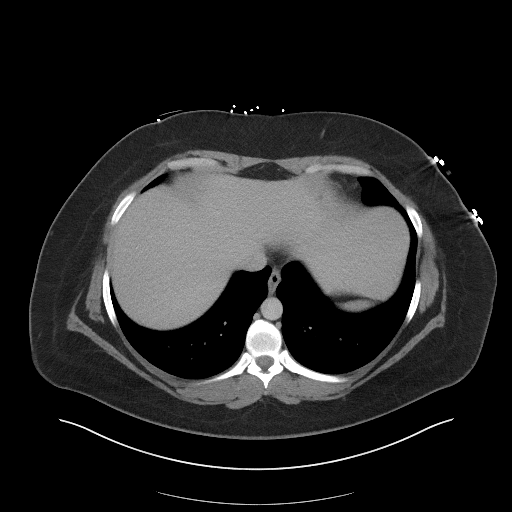
[im 112/117  soft-tissue]
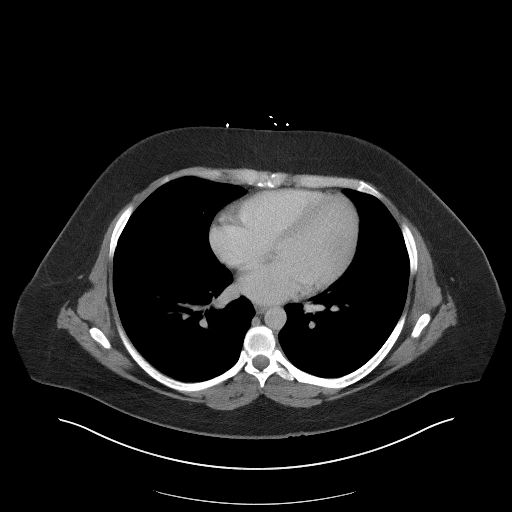

[Series 5: coronal st · coronal · 0.98mm/px · 3 of 108 slices shown]
[im 36/108  soft-tissue]
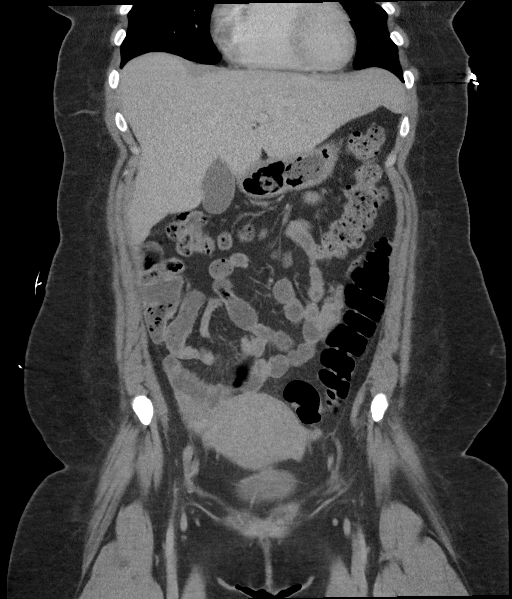
[im 48/108  soft-tissue]
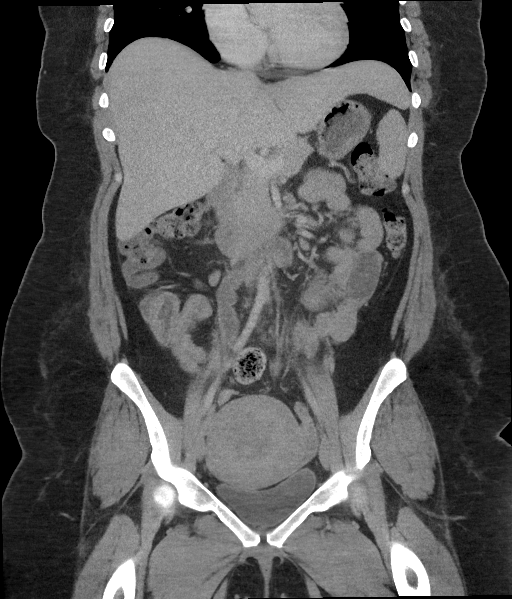
[im 60/108  soft-tissue]
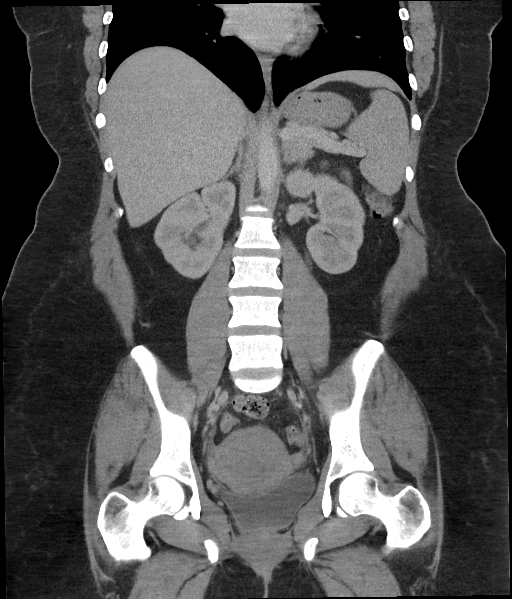

[16 of 46 positions shown; findings below may reference images not displayed]

FINDINGS: Lower chest: No acute abnormality.

Hepatobiliary: No focal liver abnormality. No gallstones,
gallbladder wall thickening, or pericholecystic fluid. No biliary
dilatation.

Pancreas: No focal lesion. Normal pancreatic contour. No surrounding
inflammatory changes. No main pancreatic ductal dilatation.

Spleen: Normal in size without focal abnormality.

Adrenals/Urinary Tract:

No adrenal nodule bilaterally.

Bilateral kidneys enhance symmetrically. No hydronephrosis. No
hydroureter.

The urinary bladder is unremarkable.

Stomach/Bowel: Stomach is within normal limits. No evidence of bowel
wall thickening or dilatation. Appendix appears normal.

Vascular/Lymphatic: No abdominal aorta or iliac aneurysm. No
abdominal, pelvic, or inguinal lymphadenopathy.

Reproductive: Thickened heterogeneous endometrium measuring up to at
least 28 cm. Bilateral ovaries are unremarkable.

Other: No intraperitoneal free fluid. No intraperitoneal free gas.
No organized fluid collection.

Musculoskeletal: No acute or significant osseous findings.
IMPRESSION: Thickened heterogeneous endometrium. Concern for retained products
of conception. Recommend pelvic ultrasound.
